# Patient Record
Sex: Male | Born: 1940 | Race: White | Hispanic: No | Marital: Married | Smoking: Never smoker
Health system: Southern US, Community
[De-identification: ages and names within clinical notes are randomized; demographics above are authoritative.]

## PROBLEM LIST (undated history)

## (undated) DIAGNOSIS — M778 Other enthesopathies, not elsewhere classified: Secondary | ICD-10-CM

## (undated) DIAGNOSIS — M545 Low back pain, unspecified: Secondary | ICD-10-CM

## (undated) DIAGNOSIS — N400 Enlarged prostate without lower urinary tract symptoms: Secondary | ICD-10-CM

## (undated) DIAGNOSIS — H9190 Unspecified hearing loss, unspecified ear: Secondary | ICD-10-CM

## (undated) DIAGNOSIS — Z950 Presence of cardiac pacemaker: Secondary | ICD-10-CM

## (undated) DIAGNOSIS — R351 Nocturia: Secondary | ICD-10-CM

## (undated) DIAGNOSIS — H9319 Tinnitus, unspecified ear: Secondary | ICD-10-CM

## (undated) DIAGNOSIS — N433 Hydrocele, unspecified: Secondary | ICD-10-CM

## (undated) DIAGNOSIS — R011 Cardiac murmur, unspecified: Secondary | ICD-10-CM

## (undated) DIAGNOSIS — I10 Essential (primary) hypertension: Secondary | ICD-10-CM

## (undated) DIAGNOSIS — E7800 Pure hypercholesterolemia, unspecified: Secondary | ICD-10-CM

## (undated) DIAGNOSIS — G4700 Insomnia, unspecified: Secondary | ICD-10-CM

## (undated) DIAGNOSIS — Q231 Congenital insufficiency of aortic valve: Secondary | ICD-10-CM

## (undated) DIAGNOSIS — Q2381 Bicuspid aortic valve: Secondary | ICD-10-CM

## (undated) DIAGNOSIS — E669 Obesity, unspecified: Secondary | ICD-10-CM

## (undated) HISTORY — DX: Insomnia, unspecified: G47.00

## (undated) HISTORY — DX: Benign prostatic hyperplasia without lower urinary tract symptoms: N40.0

## (undated) HISTORY — DX: Bicuspid aortic valve: Q23.81

## (undated) HISTORY — PX: OTHER SURGICAL HISTORY: SHX169

## (undated) HISTORY — PX: VASECTOMY: SHX75

## (undated) HISTORY — PX: JOINT REPLACEMENT: SHX530

## (undated) HISTORY — DX: Low back pain: M54.5

## (undated) HISTORY — DX: Low back pain, unspecified: M54.50

## (undated) HISTORY — DX: Congenital insufficiency of aortic valve: Q23.1

## (undated) HISTORY — DX: Obesity, unspecified: E66.9

## (undated) HISTORY — PX: TONSILLECTOMY: SUR1361

---

## 2010-04-21 ENCOUNTER — Inpatient Hospital Stay (HOSPITAL_COMMUNITY): Admission: RE | Admit: 2010-04-21 | Discharge: 2010-04-24 | Payer: Self-pay | Admitting: Orthopaedic Surgery

## 2010-08-29 LAB — CBC
HCT: 26.1 % — ABNORMAL LOW (ref 39.0–52.0)
HCT: 27.5 % — ABNORMAL LOW (ref 39.0–52.0)
HCT: 28.5 % — ABNORMAL LOW (ref 39.0–52.0)
Hemoglobin: 9.1 g/dL — ABNORMAL LOW (ref 13.0–17.0)
Hemoglobin: 9.4 g/dL — ABNORMAL LOW (ref 13.0–17.0)
Hemoglobin: 9.8 g/dL — ABNORMAL LOW (ref 13.0–17.0)
MCH: 32.2 pg (ref 26.0–34.0)
MCH: 32.5 pg (ref 26.0–34.0)
MCH: 32.6 pg (ref 26.0–34.0)
MCHC: 34.3 g/dL (ref 30.0–36.0)
MCHC: 34.3 g/dL (ref 30.0–36.0)
MCHC: 34.8 g/dL (ref 30.0–36.0)
MCV: 93.8 fL (ref 78.0–100.0)
MCV: 93.9 fL (ref 78.0–100.0)
MCV: 94.7 fL (ref 78.0–100.0)
Platelets: 148 10*3/uL — ABNORMAL LOW (ref 150–400)
Platelets: 154 10*3/uL (ref 150–400)
Platelets: 168 10*3/uL (ref 150–400)
RBC: 2.78 MIL/uL — ABNORMAL LOW (ref 4.22–5.81)
RBC: 2.93 MIL/uL — ABNORMAL LOW (ref 4.22–5.81)
RBC: 3.02 MIL/uL — ABNORMAL LOW (ref 4.22–5.81)
RDW: 13.5 % (ref 11.5–15.5)
RDW: 13.6 % (ref 11.5–15.5)
RDW: 13.8 % (ref 11.5–15.5)
WBC: 10.6 10*3/uL — ABNORMAL HIGH (ref 4.0–10.5)
WBC: 7.8 10*3/uL (ref 4.0–10.5)
WBC: 8.1 10*3/uL (ref 4.0–10.5)

## 2010-08-29 LAB — BASIC METABOLIC PANEL
BUN: 12 mg/dL (ref 6–23)
BUN: 12 mg/dL (ref 6–23)
BUN: 15 mg/dL (ref 6–23)
CO2: 28 mEq/L (ref 19–32)
CO2: 29 mEq/L (ref 19–32)
CO2: 29 mEq/L (ref 19–32)
Calcium: 8.4 mg/dL (ref 8.4–10.5)
Calcium: 8.5 mg/dL (ref 8.4–10.5)
Calcium: 8.6 mg/dL (ref 8.4–10.5)
Chloride: 106 mEq/L (ref 96–112)
Chloride: 106 mEq/L (ref 96–112)
Chloride: 107 mEq/L (ref 96–112)
Creatinine, Ser: 0.94 mg/dL (ref 0.4–1.5)
Creatinine, Ser: 1.01 mg/dL (ref 0.4–1.5)
Creatinine, Ser: 1.04 mg/dL (ref 0.4–1.5)
GFR calc Af Amer: >60 mL/min (ref >60)
GFR calc Af Amer: >60 mL/min (ref >60)
GFR calc Af Amer: >60 mL/min (ref >60)
GFR calc non Af Amer: >60 mL/min (ref >60)
GFR calc non Af Amer: >60 mL/min (ref >60)
GFR calc non Af Amer: >60 mL/min (ref >60)
Glucose, Bld: 108 mg/dL — ABNORMAL HIGH (ref 70–99)
Glucose, Bld: 112 mg/dL — ABNORMAL HIGH (ref 70–99)
Glucose, Bld: 159 mg/dL — ABNORMAL HIGH (ref 70–99)
Potassium: 3.9 mEq/L (ref 3.5–5.1)
Potassium: 4.2 mEq/L (ref 3.5–5.1)
Potassium: 4.3 mEq/L (ref 3.5–5.1)
Sodium: 140 mEq/L (ref 135–145)
Sodium: 140 mEq/L (ref 135–145)
Sodium: 143 mEq/L (ref 135–145)

## 2010-08-30 LAB — BASIC METABOLIC PANEL
BUN: 23 mg/dL (ref 6–23)
CO2: 29 mEq/L (ref 19–32)
Calcium: 10 mg/dL (ref 8.4–10.5)
Chloride: 106 mEq/L (ref 96–112)
Creatinine, Ser: 0.97 mg/dL (ref 0.4–1.5)
GFR calc Af Amer: >60 mL/min (ref >60)
GFR calc non Af Amer: >60 mL/min (ref >60)
Glucose, Bld: 89 mg/dL (ref 70–99)
Potassium: 4.9 mEq/L (ref 3.5–5.1)
Sodium: 142 mEq/L (ref 135–145)

## 2010-08-30 LAB — PROTIME-INR
INR: 1.02 (ref 0.00–1.49)
Prothrombin Time: 13.6 seconds (ref 11.6–15.2)

## 2010-08-30 LAB — URINALYSIS, ROUTINE W REFLEX MICROSCOPIC
Bilirubin Urine: NEGATIVE
Glucose, UA: NEGATIVE mg/dL
Hgb urine dipstick: NEGATIVE
Ketones, ur: NEGATIVE mg/dL
Nitrite: NEGATIVE
Protein, ur: NEGATIVE mg/dL
Specific Gravity, Urine: 1.02 (ref 1.005–1.030)
Urobilinogen, UA: 0.2 mg/dL (ref 0.0–1.0)
pH: 6.5 (ref 5.0–8.0)

## 2010-08-30 LAB — CBC
HCT: 40.1 % (ref 39.0–52.0)
Hemoglobin: 13.7 g/dL (ref 13.0–17.0)
MCH: 32.2 pg (ref 26.0–34.0)
MCHC: 34.2 g/dL (ref 30.0–36.0)
MCV: 94.2 fL (ref 78.0–100.0)
Platelets: 218 10*3/uL (ref 150–400)
RBC: 4.25 MIL/uL (ref 4.22–5.81)
RDW: 13.4 % (ref 11.5–15.5)
WBC: 5.6 10*3/uL (ref 4.0–10.5)

## 2010-08-30 LAB — SURGICAL PCR SCREEN
MRSA, PCR: NEGATIVE
Staphylococcus aureus: POSITIVE — AB

## 2011-03-09 DIAGNOSIS — R0989 Other specified symptoms and signs involving the circulatory and respiratory systems: Secondary | ICD-10-CM | POA: Insufficient documentation

## 2011-03-09 DIAGNOSIS — I452 Bifascicular block: Secondary | ICD-10-CM | POA: Insufficient documentation

## 2011-03-15 ENCOUNTER — Ambulatory Visit (HOSPITAL_COMMUNITY)
Admission: RE | Admit: 2011-03-15 | Discharge: 2011-03-15 | Disposition: A | Discharge status: Home / Self Care | Payer: BC Managed Care – PPO | Source: Ambulatory Visit | Attending: Urology | Admitting: Urology

## 2011-03-15 DIAGNOSIS — Z01812 Encounter for preprocedural laboratory examination: Secondary | ICD-10-CM | POA: Insufficient documentation

## 2011-03-15 DIAGNOSIS — I10 Essential (primary) hypertension: Secondary | ICD-10-CM | POA: Insufficient documentation

## 2011-03-15 DIAGNOSIS — Z79899 Other long term (current) drug therapy: Secondary | ICD-10-CM | POA: Insufficient documentation

## 2011-03-15 DIAGNOSIS — E7800 Pure hypercholesterolemia, unspecified: Secondary | ICD-10-CM | POA: Insufficient documentation

## 2011-03-15 DIAGNOSIS — Z01818 Encounter for other preprocedural examination: Secondary | ICD-10-CM | POA: Insufficient documentation

## 2011-03-15 DIAGNOSIS — N201 Calculus of ureter: Secondary | ICD-10-CM | POA: Insufficient documentation

## 2011-03-15 DIAGNOSIS — Z7982 Long term (current) use of aspirin: Secondary | ICD-10-CM | POA: Insufficient documentation

## 2011-03-15 DIAGNOSIS — R1084 Generalized abdominal pain: Secondary | ICD-10-CM | POA: Insufficient documentation

## 2011-03-15 DIAGNOSIS — Z96659 Presence of unspecified artificial knee joint: Secondary | ICD-10-CM | POA: Insufficient documentation

## 2011-03-15 LAB — BASIC METABOLIC PANEL
BUN: 19 mg/dL (ref 6–23)
CO2: 32 mEq/L (ref 19–32)
Calcium: 10.1 mg/dL (ref 8.4–10.5)
Chloride: 99 mEq/L (ref 96–112)
Creatinine, Ser: 0.96 mg/dL (ref 0.50–1.35)
GFR calc Af Amer: >60 mL/min (ref >60)
GFR calc non Af Amer: >60 mL/min (ref >60)
Glucose, Bld: 103 mg/dL — ABNORMAL HIGH (ref 70–99)
Potassium: 3.5 mEq/L (ref 3.5–5.1)
Sodium: 139 mEq/L (ref 135–145)

## 2011-03-15 LAB — CBC
HCT: 36.5 % — ABNORMAL LOW (ref 39.0–52.0)
Hemoglobin: 12.5 g/dL — ABNORMAL LOW (ref 13.0–17.0)
MCH: 30.9 pg (ref 26.0–34.0)
MCHC: 34.2 g/dL (ref 30.0–36.0)
MCV: 90.1 fL (ref 78.0–100.0)
Platelets: 209 10*3/uL (ref 150–400)
RBC: 4.05 MIL/uL — ABNORMAL LOW (ref 4.22–5.81)
RDW: 13.5 % (ref 11.5–15.5)
WBC: 6.1 10*3/uL (ref 4.0–10.5)

## 2011-03-15 LAB — SURGICAL PCR SCREEN
MRSA, PCR: NEGATIVE
Staphylococcus aureus: POSITIVE — AB

## 2011-03-29 NOTE — Op Note (Signed)
NAMERONDEL, EPISCOPO NO.:  [id number]  MEDICAL RECORD NO.:  [id number]  LOCATION:                               FACILITY:  [facility]  PHYSICIAN:  [name], MD      DATE OF BIRTH:  Sep 29, 1940  DATE OF PROCEDURE:  03/15/2011 DATE OF DISCHARGE:                              OPERATIVE REPORT   PREOPERATIVE DIAGNOSIS:  Right ureteral calculus.  POSTOPERATIVE DIAGNOSIS:  Right ureteral calculus.  PROCEDURE: 1. Cystoscopy. 2. Right retrograde pyelography with interpretation. 3. Right ureteroscopic laser lithotripsy and stone removal. 4. Right ureteral stent placement (6 x 24).  SURGEON:  [name], MD  ANESTHESIA:  General.  COMPLICATIONS:  None.  INDICATION:  Derek Washington is a 70 year old gentleman who recently developed right-sided flank pain and underwent a CT scan, which demonstrated a large 1 cm distal right ureteral calculus.  After discussing options for treatment, he elected to proceed with ureteroscopic laser lithotripsy as indicated above.  The potential risks, complications, and alternative treatment options were discussed in detail and informed consent obtained.  DESCRIPTION OF PROCEDURE:  The patient was taken to the operating room and a general anesthetic was administered.  He was given preoperative antibiotics, placed in the dorsal lithotomy position, and prepped and draped in the usual sterile fashion.  Next, preoperative time-out was performed.  Cystourethroscopy was then performed, which revealed a normal anterior urethra.  The prostatic urethra was significant for a high bladder neck and bilobar hypertrophy as well.  Inspection of the bladder revealed no evidence of bladder tumors, stones, or other mucosal pathology.  Attention then turned to the right ureteral orifice, which was cannulated with a 6-French ureteral catheter.  Omnipaque contrast was injected and demonstrated a large filling defect in the distal right ureter consistent  with the patient's calculus.  A 0.038 sensor guidewire was then manipulated into the ureter and passed the stone into the right renal pelvis under fluoroscopic guidance.  Of note, the patient's ureteral orifice was noted to be quite stenotic.  A semi-rigid 6-French ureteroscope was then advanced into the bladder and a second 0.038 Glidewire was used to help open the ureteral orifice to allow the ureteroscope passed into the distal ureter, which he did without significant difficulty.  The stone was immediately identified.  The catheter was then advanced, passed the stone into the ureter just proximal to the stone and it was used to inject BackStop Gel.  This prevented stone migration proximally.  A 365 micron fiber was then used to perform holmium laser lithotripsy of the stone in the setting of 0.6 joules and 6 Hz.  Once the stone was adequately fragmented, the fragments were all removed with a Zero Tip nitinol basket and brought into the bladder.  The stones were eventually removed via the cystoscope and drained out of the bladder and removed for specimen.  Due to fact that there were multiple fragments required, the ureteroscope to be passed in and out of the ureter multiple times, it was felt that there would be significant ureteral edema.  Therefore, it was decided to place a ureteral stent.  The wire was back loaded on the cystoscope and a 6 x  24 double-J ureteral stent was advanced over the wire using Seldinger technique and positioned appropriately under fluoroscopic and cystoscopic guidance.  The wire was then removed with a good curl noted in the renal pelvis as well as in the bladder.  The patient appeared to tolerate the procedure well and without complications.  He was able to be awakened and transferred to recovery unit in satisfactory condition.  He will follow up in approximately 2 weeks for cystoscopy and stent removal in the office.     [name],  MD     LB/MEDQ  D:  03/15/2011  T:  03/16/2011  Job:  [id number]  Electronically Signed by [name] MD on 03/29/2011 07:08:14 PM

## 2011-11-09 DIAGNOSIS — R7301 Impaired fasting glucose: Secondary | ICD-10-CM | POA: Insufficient documentation

## 2013-03-02 DIAGNOSIS — R55 Syncope and collapse: Secondary | ICD-10-CM | POA: Insufficient documentation

## 2013-05-22 ENCOUNTER — Other Ambulatory Visit: Payer: Self-pay | Admitting: Orthopaedic Surgery

## 2013-05-22 DIAGNOSIS — M25512 Pain in left shoulder: Secondary | ICD-10-CM

## 2013-05-26 ENCOUNTER — Ambulatory Visit
Admission: RE | Admit: 2013-05-26 | Discharge: 2013-05-26 | Disposition: A | Discharge status: Home / Self Care | Payer: Medicare Other | Source: Ambulatory Visit | Attending: Orthopaedic Surgery | Admitting: Orthopaedic Surgery

## 2013-05-26 DIAGNOSIS — M25512 Pain in left shoulder: Secondary | ICD-10-CM

## 2013-09-24 DIAGNOSIS — B009 Herpesviral infection, unspecified: Secondary | ICD-10-CM | POA: Insufficient documentation

## 2015-06-19 HISTORY — PX: HYDROCELE EXCISION: SHX482

## 2015-10-21 ENCOUNTER — Other Ambulatory Visit: Payer: Self-pay | Admitting: Urology

## 2015-12-22 ENCOUNTER — Encounter (HOSPITAL_COMMUNITY)
Admission: RE | Admit: 2015-12-22 | Discharge: 2015-12-22 | Disposition: A | Discharge status: Home / Self Care | Payer: Medicare Other | Source: Ambulatory Visit | Attending: Urology | Admitting: Urology

## 2015-12-22 ENCOUNTER — Encounter (HOSPITAL_COMMUNITY): Payer: Self-pay

## 2015-12-22 ENCOUNTER — Ambulatory Visit (HOSPITAL_COMMUNITY)
Admission: RE | Admit: 2015-12-22 | Discharge: 2015-12-22 | Disposition: A | Discharge status: Home / Self Care | Payer: Medicare Other | Source: Ambulatory Visit | Attending: Anesthesiology | Admitting: Anesthesiology

## 2015-12-22 DIAGNOSIS — I700 Atherosclerosis of aorta: Secondary | ICD-10-CM | POA: Insufficient documentation

## 2015-12-22 DIAGNOSIS — Z01810 Encounter for preprocedural cardiovascular examination: Secondary | ICD-10-CM

## 2015-12-22 DIAGNOSIS — Z01818 Encounter for other preprocedural examination: Secondary | ICD-10-CM | POA: Diagnosis not present

## 2015-12-22 HISTORY — DX: Pure hypercholesterolemia, unspecified: E78.00

## 2015-12-22 HISTORY — DX: Other enthesopathies, not elsewhere classified: M77.8

## 2015-12-22 HISTORY — DX: Nocturia: R35.1

## 2015-12-22 HISTORY — DX: Cardiac murmur, unspecified: R01.1

## 2015-12-22 HISTORY — DX: Hydrocele, unspecified: N43.3

## 2015-12-22 HISTORY — DX: Essential (primary) hypertension: I10

## 2015-12-22 HISTORY — DX: Unspecified hearing loss, unspecified ear: H91.90

## 2015-12-22 HISTORY — DX: Tinnitus, unspecified ear: H93.19

## 2015-12-22 LAB — CBC
HCT: 39.8 % (ref 39.0–52.0)
Hemoglobin: 13.8 g/dL (ref 13.0–17.0)
MCH: 31.2 pg (ref 26.0–34.0)
MCHC: 34.7 g/dL (ref 30.0–36.0)
MCV: 90 fL (ref 78.0–100.0)
Platelets: 214 10*3/uL (ref 150–400)
RBC: 4.42 MIL/uL (ref 4.22–5.81)
RDW: 13.5 % (ref 11.5–15.5)
WBC: 6.9 10*3/uL (ref 4.0–10.5)

## 2015-12-22 LAB — BASIC METABOLIC PANEL
Anion gap: 8 (ref 5–15)
BUN: 23 mg/dL — ABNORMAL HIGH (ref 6–20)
CO2: 26 mmol/L (ref 22–32)
Calcium: 9.9 mg/dL (ref 8.9–10.3)
Chloride: 106 mmol/L (ref 101–111)
Creatinine, Ser: 0.95 mg/dL (ref 0.61–1.24)
GFR calc Af Amer: >60 mL/min (ref >60)
GFR calc non Af Amer: >60 mL/min (ref >60)
Glucose, Bld: 101 mg/dL — ABNORMAL HIGH (ref 65–99)
Potassium: 4.5 mmol/L (ref 3.5–5.1)
Sodium: 140 mmol/L (ref 135–145)

## 2015-12-22 NOTE — Progress Notes (Addendum)
BMP and CXR results per epic per PAT visit 12/22/2015 sent to Dr Laverle PatterBorden

## 2015-12-22 NOTE — Patient Instructions (Signed)
Derek OfficerMerllyn Washington  12/22/2015   Your procedure is scheduled on: Monday December 26, 2015  [facility]  [place] to 3rd floor to  Short Stay Center at 1:45 PM.  Call this number if you have problems the morning of surgery [phone]   Remember: ONLY 1 PERSON MAY GO WITH YOU TO SHORT STAY TO GET  READY MORNING OF YOUR SURGERY.  Do not eat food After Midnight butr may take clear liquids till 9:45 am day of surgery then nothing by mouth.      Take these medicines the morning of surgery with A SIP OF WATER: Finesteride                                You may not have any metal on your body including hair pins and              piercings  Do not wear jewelry,  lotions, powders or colognes, deodorant                           Men may shave face and neck.   Do not bring valuables to the hospital. [facility] IS NOT             RESPONSIBLE   FOR VALUABLES.  Contacts, dentures or bridgework may not be worn into surgery.      Patients discharged the day of surgery will not be allowed to drive home.  Name and phone number of your driver:Derek Washington (wife)  _____________________________________________________________________             [facility] - Preparing for Surgery Before surgery, you can play an important role.  Because skin is not sterile, your skin needs to be as free of germs as possible.  You can reduce the number of germs on your skin by washing with CHG (chlorahexidine gluconate) soap before surgery.  CHG is an antiseptic cleaner which kills germs and bonds with the skin to continue killing germs even after washing. Please DO NOT use if you have an allergy to CHG or antibacterial soaps.  If your skin becomes reddened/irritated stop using the CHG and inform your nurse when you arrive at Short Stay. Do not shave (including legs and underarms) for at least 48 hours prior to the first CHG shower.  You may shave your  face/neck. Please follow these instructions carefully:  1.  Shower with CHG Soap the night before surgery and the  morning of Surgery.  2.  If you choose to wash your hair, wash your hair first as usual with your  normal  shampoo.  3.  After you shampoo, rinse your hair and body thoroughly to remove the  shampoo.                           4.  Use CHG as you would any other liquid soap.  You can apply chg directly  to the skin and wash                       Gently with a scrungie or clean washcloth.  5.  Apply the CHG Soap to your body ONLY FROM THE NECK DOWN.   Do not  use on face/ open                           Wound or open sores. Avoid contact with eyes, ears mouth and genitals (private parts).                       Wash face,  Genitals (private parts) with your normal soap.             6.  Wash thoroughly, paying special attention to the area where your surgery  will be performed.  7.  Thoroughly rinse your body with warm water from the neck down.  8.  DO NOT shower/wash with your normal soap after using and rinsing off  the CHG Soap.                9.  Pat yourself dry with a clean towel.            10.  Wear clean pajamas.            11.  Place clean sheets on your bed the night of your first shower and do not  sleep with pets. Day of Surgery : Do not apply any lotions/deodorants the morning of surgery.  Please wear clean clothes to the hospital/surgery center.  FAILURE TO FOLLOW THESE INSTRUCTIONS MAY RESULT IN THE CANCELLATION OF YOUR SURGERY PATIENT SIGNATURE_________________________________  NURSE SIGNATURE__________________________________  ________________________________________________________________________    CLEAR LIQUID DIET   Foods Allowed                                                                     Foods Excluded  Coffee and tea, regular and decaf                             liquids that you cannot  Plain Jell-O in any flavor                                              see through such as: Fruit ices (not with fruit pulp)                                     milk, soups, orange juice  Iced Popsicles                                    All solid food Carbonated beverages, regular and diet                                    Cranberry, grape and apple juices Sports drinks like Gatorade Lightly seasoned clear broth or consume(fat free) Sugar, honey syrup  Sample Menu Breakfast  Lunch                                     Supper Cranberry juice                    Beef broth                            Chicken broth Jell-O                                     Grape juice                           Apple juice Coffee or tea                        Jell-O                                      Popsicle                                                Coffee or tea                        Coffee or tea  _____________________________________________________________________

## 2015-12-22 NOTE — Progress Notes (Signed)
OV note per cardiology / Dr Gwendolyn LimaBodek 07/12/2015 on chart ECHO report per chart 06/27/2015  EKG per chart 07/12/2015

## 2015-12-22 NOTE — Progress Notes (Signed)
Requested cardiology / Dr Gwendolyn LimaBodek clearance. Spoke with [employer].

## 2015-12-23 NOTE — H&P (Signed)
History of Present Illness Mr. 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 His PSA increased to 5.34 in December 2009 but a PCA-3 was negative. He is treated with finasteride (began January 2010). After his PSA reached a new baseline of 1.59 on 5 ARI therapy, his PSA again increased to 3.61 in April 2013 prompting another prostate biopsy which was benign. 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 He has BPH based on his prostate ultrasound in June 2009. Current treatment: Finasteride 5 mg (began in January 2010) 3) Urolithiasis: He has a history of calcium oxalate urolithiasis. 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 This has been asymptomatic until 2017. 5) S/P left hydrocele repair in 1988 6) S/P vasectomy in 1968  Past Medical History Problems  1. History of hypercholesterolemia (Z86.39) 2. History of hypertension (Z86.79) Surgical History Problems  1. History of Cystoscopy With Insertion Of Ureteral Stent Right 2. History of Cystoscopy With Ureteroscopy With Lithotripsy 3. History of Elbow Surgery Repair 4. History of Knee Replacement 5. History of Knee Surgery 6. History of Surgery Tunica Vaginalis Excision Of Hydrocele Left 7. History of Surgery Vas Deferens Vasectomy Current Meds 1. AmLODIPine Besylate 5 MG Oral Tablet; Therapy: 30Jul2012 to Recorded 2. Aspirin 325 MG Oral Tablet; Therapy: (Recorded:11Jun2009) to Recorded 3. Atorvastatin Calcium 20 MG  Oral Tablet; Therapy: 31Aug2012 to Recorded 4. Finasteride 5 MG Oral Tablet; Take 1 tablet every day; Therapy: 13Jan2010 to (Evaluate:11Apr2016) Requested for: 17Apr2015; Last Rx:17Apr2015 Ordered 5. Fluocinonide 0.05 % External Cream; Therapy: 23Aug2012 to Recorded 6. Lisinopril-Hydrochlorothiazide 20-12.5 MG Oral Tablet; Therapy: 27Apr2012 to Recorded 7. Multivitamins TABS; Therapy: (Recorded:11Jun2009) to Recorded 8. Niaspan 500 MG Oral Tablet Extended Release; Therapy: (Recorded:11Jun2009) to Recorded 9. Prinzide 20-25 MG TABS; Therapy: (Recorded:11Jun2009) to Recorded 10. Triamcinolone Acetonide 0.1 % External Cream; Therapy: 24Mar2014 to Recorded 11. Zolpidem Tartrate 10 MG Oral Tablet; Therapy: 13Sep2013 to Recorded Allergies Medication  1. No Known Drug Allergies Family History Problems  1. No pertinent family history : Mother 2. Denied: Family history of Prostate Cancer Social History Problems  1. Alcohol Use (History)  maybe 2 a month 2. Marital History - Currently Married 3. Never A Smoker 4. Denied: History of Tobacco Use Review of Systems Genitourinary: no hematuria.  Hematologic/Lymphatic: no swollen glands.  Cardiovascular: no leg swelling.  Neurological: no headache.  Vitals  Height: 6 ft Weight: 235 lb BMI Calculated: 31.87 BSA Calculated: 2.28  Physical Exam Constitutional: Well nourished and well developed . No acute distress.  ENT:. The ears and nose are normal in appearance.  Neck: The appearance of the neck is normal and no neck mass is present.  Pulmonary: No respiratory distress, normal respiratory rhythm and effort and clear bilateral breath sounds.  Cardiovascular: Heart rate and rhythm are normal . No peripheral edema.  Rectal: Rectal exam demonstrates normal sphincter tone, no tenderness and no masses. Prostate size is estimated to be 50 g. The prostate has no nodularity and is not tender. The left seminal vesicle  is nonpalpable. The  right seminal vesicle is nonpalpable. The perineum is normal on inspection.  Genitourinary: Examination of the penis demonstrates no discharge, no masses, no lesions and a normal meatus. The scrotum is without lesions. The right epididymis is palpably normal and non-tender. The right testis is non-tender and without masses. He does have an enlarged left hemiscrotum with a large cystic masses previously noted. Left testis is able be palpated inferiorly indicating that this is likely consistent with a spermatocele as previously noted on ultrasound. This remains relatively large measuring 7 x 6 cm. It is soft on palpation and nontender.  Neuro/Psych:. Mood and affect are appropriate.   Assessment Assessed  1 Spermatocele (N43.40)  Discussion/Summary . Left spermatocele: He does wish to proceed with surgical removal as this has become more bothersome.  We reviewed potential risks including but not limited to bleeding, infection, hematoma formation, epididymitis, risk of injury to the testicle or blood supply to the testicle, infertility, hypogonadism, etc. We also discussed the potential risk of recurrence. I discussed the potential benefits and risks of the procedure, side effects of the proposed treatment, the likelihood of the patient achieving the goals of the procedure, and any potential problems that might occur during the procedure or recuperation.

## 2015-12-23 NOTE — Progress Notes (Signed)
Spoke with Dr B Judd/anesthesia in regards to pts H&P, ECHO report (per chart) and EKG (per chart). No orders given. Dr Gentry RochJudd did not feel pt was required to have cardiac clearance prior to scheduled surgery date. Anesthesia to see pt day of surgery.

## 2015-12-26 ENCOUNTER — Encounter (HOSPITAL_COMMUNITY)
Admission: RE | Disposition: A | Discharge status: Home / Self Care | Payer: Self-pay | Source: Ambulatory Visit | Attending: Urology

## 2015-12-26 ENCOUNTER — Encounter (HOSPITAL_COMMUNITY): Payer: Self-pay | Admitting: *Deleted

## 2015-12-26 ENCOUNTER — Ambulatory Visit (HOSPITAL_COMMUNITY)
Admission: RE | Admit: 2015-12-26 | Discharge: 2015-12-26 | Disposition: A | Discharge status: Home / Self Care | Payer: Medicare Other | Source: Ambulatory Visit | Attending: Urology | Admitting: Urology

## 2015-12-26 ENCOUNTER — Ambulatory Visit (HOSPITAL_COMMUNITY): Payer: Medicare Other | Admitting: Anesthesiology

## 2015-12-26 DIAGNOSIS — I10 Essential (primary) hypertension: Secondary | ICD-10-CM | POA: Insufficient documentation

## 2015-12-26 DIAGNOSIS — Z7982 Long term (current) use of aspirin: Secondary | ICD-10-CM | POA: Insufficient documentation

## 2015-12-26 DIAGNOSIS — N4340 Spermatocele of epididymis, unspecified: Secondary | ICD-10-CM | POA: Diagnosis present

## 2015-12-26 DIAGNOSIS — R972 Elevated prostate specific antigen [PSA]: Secondary | ICD-10-CM | POA: Insufficient documentation

## 2015-12-26 DIAGNOSIS — E7800 Pure hypercholesterolemia, unspecified: Secondary | ICD-10-CM | POA: Insufficient documentation

## 2015-12-26 DIAGNOSIS — Z79899 Other long term (current) drug therapy: Secondary | ICD-10-CM | POA: Insufficient documentation

## 2015-12-26 DIAGNOSIS — N4341 Spermatocele of epididymis, single: Secondary | ICD-10-CM | POA: Diagnosis not present

## 2015-12-26 DIAGNOSIS — R351 Nocturia: Secondary | ICD-10-CM | POA: Insufficient documentation

## 2015-12-26 DIAGNOSIS — R3912 Poor urinary stream: Secondary | ICD-10-CM | POA: Diagnosis not present

## 2015-12-26 DIAGNOSIS — N401 Enlarged prostate with lower urinary tract symptoms: Secondary | ICD-10-CM | POA: Diagnosis not present

## 2015-12-26 DIAGNOSIS — I4510 Unspecified right bundle-branch block: Secondary | ICD-10-CM | POA: Diagnosis not present

## 2015-12-26 DIAGNOSIS — Z87442 Personal history of urinary calculi: Secondary | ICD-10-CM | POA: Diagnosis not present

## 2015-12-26 DIAGNOSIS — I352 Nonrheumatic aortic (valve) stenosis with insufficiency: Secondary | ICD-10-CM | POA: Diagnosis not present

## 2015-12-26 HISTORY — PX: SCROTAL EXPLORATION: SHX2386

## 2015-12-26 SURGERY — EXPLORATION, SCROTUM
Anesthesia: General

## 2015-12-26 MED ORDER — ONDANSETRON HCL 4 MG/2ML IJ SOLN: 4.0000 mg | Freq: Once | INTRAMUSCULAR | Status: DC | PRN

## 2015-12-26 MED ORDER — BUPIVACAINE HCL (PF) 0.25 % IJ SOLN
INTRAMUSCULAR | Status: AC
  Filled 2015-12-26: qty 30

## 2015-12-26 MED ORDER — CEFAZOLIN SODIUM-DEXTROSE 2-4 GM/100ML-% IV SOLN
2.0000 g | INTRAVENOUS | Status: AC
  Administered 2015-12-26: 2 g via INTRAVENOUS
  Filled 2015-12-26: qty 100

## 2015-12-26 MED ORDER — HYDROCODONE-ACETAMINOPHEN 5-325 MG PO TABS: 1.0000 | ORAL_TABLET | ORAL | Status: DC | PRN

## 2015-12-26 MED ORDER — CEFAZOLIN SODIUM-DEXTROSE 2-4 GM/100ML-% IV SOLN
INTRAVENOUS | Status: AC
Start: 2015-12-26 — End: 2015-12-26
  Filled 2015-12-26: qty 100

## 2015-12-26 MED ORDER — PROPOFOL 10 MG/ML IV BOLUS
INTRAVENOUS | Status: AC
  Filled 2015-12-26: qty 20

## 2015-12-26 MED ORDER — FENTANYL CITRATE (PF) 100 MCG/2ML IJ SOLN: 25.0000 ug | INTRAMUSCULAR | Status: DC | PRN

## 2015-12-26 MED ORDER — KETOROLAC TROMETHAMINE 30 MG/ML IJ SOLN
INTRAMUSCULAR | Status: DC | PRN
  Administered 2015-12-26: 30 mg via INTRAVENOUS

## 2015-12-26 MED ORDER — PROPOFOL 10 MG/ML IV BOLUS
INTRAVENOUS | Status: DC | PRN
  Administered 2015-12-26: 200 mg via INTRAVENOUS

## 2015-12-26 MED ORDER — LIDOCAINE 2% (20 MG/ML) 5 ML SYRINGE
INTRAMUSCULAR | Status: DC | PRN
  Administered 2015-12-26: 100 mg via INTRAVENOUS

## 2015-12-26 MED ORDER — BUPIVACAINE HCL (PF) 0.25 % IJ SOLN
INTRAMUSCULAR | Status: DC | PRN
  Administered 2015-12-26: 10 mL

## 2015-12-26 MED ORDER — EPHEDRINE SULFATE 50 MG/ML IJ SOLN
INTRAMUSCULAR | Status: AC
  Filled 2015-12-26: qty 1

## 2015-12-26 MED ORDER — SODIUM CHLORIDE 0.9 % IJ SOLN
INTRAMUSCULAR | Status: AC
  Filled 2015-12-26: qty 10

## 2015-12-26 MED ORDER — FENTANYL CITRATE (PF) 100 MCG/2ML IJ SOLN
INTRAMUSCULAR | Status: AC
  Filled 2015-12-26: qty 2

## 2015-12-26 MED ORDER — LACTATED RINGERS IV SOLN
INTRAVENOUS | Status: DC
  Administered 2015-12-26: 1000 mL via INTRAVENOUS
  Administered 2015-12-26: 17:00:00 via INTRAVENOUS

## 2015-12-26 MED ORDER — EPHEDRINE SULFATE 50 MG/ML IJ SOLN
INTRAMUSCULAR | Status: DC | PRN
  Administered 2015-12-26 (×3): 10 mg via INTRAVENOUS

## 2015-12-26 MED ORDER — FENTANYL CITRATE (PF) 100 MCG/2ML IJ SOLN
INTRAMUSCULAR | Status: DC | PRN
  Administered 2015-12-26 (×2): 50 ug via INTRAVENOUS

## 2015-12-26 MED ORDER — PHENYLEPHRINE HCL 10 MG/ML IJ SOLN
INTRAMUSCULAR | Status: DC | PRN
  Administered 2015-12-26: 80 ug via INTRAVENOUS

## 2015-12-26 SURGICAL SUPPLY — 21 items
BLADE HEX COATED 2.75 (ELECTRODE) IMPLANT
BNDG GAUZE ELAST 4 BULKY (GAUZE/BANDAGES/DRESSINGS) ×4 IMPLANT
COVER SURGICAL LIGHT HANDLE (MISCELLANEOUS) IMPLANT
ELECT REM PT RETURN 9FT ADLT (ELECTROSURGICAL) ×2
ELECTRODE REM PT RTRN 9FT ADLT (ELECTROSURGICAL) ×1 IMPLANT
GLOVE BIOGEL M STRL SZ7.5 (GLOVE) ×4 IMPLANT
GOWN STRL REUS W/TWL LRG LVL3 (GOWN DISPOSABLE) ×4 IMPLANT
KIT BASIN OR (CUSTOM PROCEDURE TRAY) ×2 IMPLANT
LIQUID BAND (GAUZE/BANDAGES/DRESSINGS) ×2 IMPLANT
NEEDLE HYPO 22GX1.5 SAFETY (NEEDLE) ×2 IMPLANT
NS IRRIG 1000ML POUR BTL (IV SOLUTION) ×2 IMPLANT
PACK GENERAL/GYN (CUSTOM PROCEDURE TRAY) ×2 IMPLANT
SUPPORT SCROTAL LG STRP (MISCELLANEOUS) IMPLANT
SUT CHROMIC 3 0 SH 27 (SUTURE) ×8 IMPLANT
SUT CHROMIC 4 0 SH 27 (SUTURE) ×2 IMPLANT
SUT VIC AB 2-0 UR5 27 (SUTURE) IMPLANT
SUT VICRYL 0 TIES 12 18 (SUTURE) IMPLANT
SYR CONTROL 10ML LL (SYRINGE) ×2 IMPLANT
TOWEL OR 17X26 10 PK STRL BLUE (TOWEL DISPOSABLE) ×2 IMPLANT
TUBING INSUFFLATION 10FT LAP (TUBING) IMPLANT
WATER STERILE IRR 1500ML POUR (IV SOLUTION) IMPLANT

## 2015-12-26 NOTE — Anesthesia Procedure Notes (Signed)
Procedure Name: LMA Insertion Date/Time: 12/26/2015 4:23 PM Performed by: Leroy LibmanEARDON, Jazae Gandolfi L Patient Re-evaluated:Patient Re-evaluated prior to inductionOxygen Delivery Method: Circle system utilized Preoxygenation: Pre-oxygenation with 100% oxygen Intubation Type: IV induction Ventilation: Mask ventilation without difficulty LMA Size: 4.0 Number of attempts: 1 Placement Confirmation: positive ETCO2 and breath sounds checked- equal and bilateral Tube secured with: Tape Dental Injury: Teeth and Oropharynx as per pre-operative assessment

## 2015-12-26 NOTE — Op Note (Signed)
Operative Note:  Preoperative Diagnosis: Left spermatocele  Postoperative Diagnosis:  Same  Procedure(s) Performed:   1. Left spermatocelectomy  Teaching Surgeon:  Heloise PurpuraLester Rafay Dahan, MD  Resident Surgeon:  Lincoln Brighamroy Sukhu, MD  Assistant(s):  None  Anesthesia:  General via LMA  Fluids:  See anesthesia record  Estimated blood loss:  10 mL  Specimens: Left spermatocele sac sent for final pathology  Cultures:  None  Drains:  None  Complications:  None  Indications: 75 yo male with large left symptomatic spermatoceles requesting repair.  Findings:  Left loculated spermatocele sac dissected down to neck prior to excision and sent for final pathology. Normal left and spermatic cord with evidence of prior hydrocelectomy with no obvious recurrent hydrocele. Excellent hemostasis at end.  Description:  The patient was correctly identified in the preop holding area where written informed consent as well potential risk and complication reviewed. He agreed.  The patient was brought to the operating room and placed supine on the operating room table.  General anesthesia was administered. A time out was performed.  The patient was identified, the consent was reviewed and the side and site of surgery was verified.  The patient was examined and the diagnoses confirmed.   The lower abdomen and genitalia were prepped and draped in the usual sterile manner.    A 5 cm vertical incision was made along the midline raphe with a 15 blade scalpel.  We turned our attention to the left large multi-loculated spermatocele and testicle. Electrocautery was used to dissect through the subcutaneous tissues and dartos down to the left spermatocele sac. The left testicle and spermatocele was also delivered after excision of an inflammatory rind. The testicle was grossly normal in appearance and on palpation with no masses identified. A combination of blunt dissection and electrocautery were utilized to carefully dissect off  the surrounding layers of the spermatocele sac and completely mobilize the spermatocele at two possible thin necks.  We then placed a hemostat across both bases and both necks were tied off with 3-0 chromic suture prior to being sharply divided. We then send the spermatocele sac for final pathology. The prior hydrocele sac was opened in a small location with a small amount of fluid evacuated but no significant hydrocele noted. The prior sac appeared scarred down.  The left cord was examined to ensure that all cord structures were present. We then placed the left testicle back into the scrotum in normal anatomic position assuring no twisting of the spermatic cord. We used electrocautery to assure excellent hemostasis.  We then utilized 3-0 chromic in a running baseball fashion to close dartos and the subcutaneous tissue. Finally, we used 4-0 chromic to close the scrotal skin with a running horizontal mattress.   The surgical area was cleaned and dried.  Dermabond was applied to the scrotal incision and once dried we placed fluffs and mesh underwear. The patient tolerated the procedure well, was awaken and transferred to PACU in stable condition.  There were no complications.  Final counts were correct.    Post Op Plan:   1. Discharge patient home when meets PACU criteria. 2. Resume aspirin in one week.  Attestation:  Dr. Laverle PatterBorden was present and scrubbed for the entirety of the procedure.

## 2015-12-26 NOTE — Anesthesia Postprocedure Evaluation (Signed)
Anesthesia Post Note  Patient: Derek Washington  Procedure(s) Performed: Procedure(s) (LRB): SCROTUM EXPLORATION (N/A)  Patient location during evaluation: PACU Anesthesia Type: General Level of consciousness: awake and alert Pain management: pain level controlled Vital Signs Assessment: post-procedure vital signs reviewed and stable Respiratory status: spontaneous breathing, nonlabored ventilation, respiratory function stable and patient connected to nasal cannula oxygen Cardiovascular status: blood pressure returned to baseline and stable Postop Assessment: no signs of nausea or vomiting Anesthetic complications: no    Last Vitals:  Filed Vitals:   12/26/15 1737 12/26/15 1745  BP: 162/77 164/72  Pulse: 68 66  Temp: 36.7 C   Resp: 12 17    Last Pain: There were no vitals filed for this visit.               Rony Ratz S

## 2015-12-26 NOTE — Transfer of Care (Signed)
Immediate Anesthesia Transfer of Care Note  Patient: Derek OfficerMerllyn Caseres  Procedure(s) Performed: Procedure(s) with comments: SCROTUM EXPLORATION (N/A) - EXCISION OF LEFT EPIDIDYMAL CYST/SPERMATOCELE  Patient Location: PACU  Anesthesia Type:General  Level of Consciousness: awake, alert  and oriented  Airway & Oxygen Therapy: Patient Spontanous Breathing and Patient connected to face mask oxygen  Post-op Assessment: Report given to RN and Post -op Vital signs reviewed and stable  Post vital signs: Reviewed and stable  Last Vitals:  Filed Vitals:   12/26/15 1412  BP: 136/81  Pulse: 55  Temp: 36.9 C  Resp: 14    Last Pain: There were no vitals filed for this visit.    Patients Stated Pain Goal: 4 (12/26/15 1534)  Complications: No apparent anesthesia complications

## 2015-12-26 NOTE — Anesthesia Preprocedure Evaluation (Addendum)
Anesthesia Evaluation  Patient identified by MRN, date of birth, ID band Patient awake    Reviewed: Allergy & Precautions, NPO status , Patient's Chart, lab work & pertinent test results  Airway Mallampati: II  TM Distance: >3 FB Neck ROM: Full    Dental no notable dental hx. (+) Teeth Intact, Dental Advisory Given   Pulmonary neg pulmonary ROS,    Pulmonary exam normal breath sounds clear to auscultation       Cardiovascular hypertension, Pt. 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  Reproductive/Obstetrics negative OB ROS                          Anesthesia Physical Anesthesia Plan  ASA: II  Anesthesia Plan: General   Post-op Pain Management:    Induction: Intravenous  Airway Management Planned: LMA  Additional Equipment:   Intra-op Plan:   Post-operative Plan: Extubation in OR  Informed Consent: I have reviewed the patients History and Physical, chart, labs and discussed the procedure including the risks, benefits and alternatives for the proposed anesthesia with the patient or authorized representative who has indicated his/her understanding and acceptance.   Dental advisory given  Plan Discussed with: CRNA and Surgeon  Anesthesia Plan Comments:  (Risks/benefits of general anesthesia discussed with patient including risk of damage to teeth, lips, gum, and tongue, nausea/vomiting, allergic reactions to medications, and the possibility of heart attack, stroke and death.  All patient questions answered.  Patient wishes to proceed.)       Anesthesia Quick Evaluation

## 2015-12-27 ENCOUNTER — Encounter (HOSPITAL_COMMUNITY): Payer: Self-pay | Admitting: Urology

## 2018-06-06 ENCOUNTER — Telehealth: Payer: Self-pay

## 2018-06-06 NOTE — Telephone Encounter (Signed)
SENT REFERRAL TO SCHEDULING AND FILED NOTES 

## 2018-08-28 ENCOUNTER — Encounter: Payer: Self-pay | Admitting: Interventional Cardiology

## 2018-09-08 ENCOUNTER — Ambulatory Visit: Payer: Medicare Other | Admitting: Interventional Cardiology

## 2018-10-23 ENCOUNTER — Telehealth: Payer: Self-pay

## 2018-10-23 NOTE — Telephone Encounter (Signed)
Called pt to set up possible evisit, left message asking pt to call the office.  

## 2018-10-27 NOTE — Telephone Encounter (Signed)
Called pt to set up possible evisit, left message asking them to call the office.

## 2018-11-03 ENCOUNTER — Telehealth: Payer: Self-pay

## 2018-11-03 NOTE — Telephone Encounter (Signed)
Virtual Visit Pre-Appointment Phone Call  TELEPHONE CALL NOTE  Derek Washington has been deemed a candidate for a follow-up tele-health visit to limit community exposure during the Covid-19 pandemic. I spoke with the patient via phone to ensure availability of phone/video source, confirm preferred email & phone number, and discuss instructions and expectations.  I reminded Derek Washington to be prepared with any vital sign and/or heart rhythm information that could potentially be obtained via home monitoring, at the time of his visit. I reminded Derek Washington to expect a phone call prior to his visit.  Patient agrees to consent below.  Lattie Haw, RN 11/03/2018 1:51 PM    FULL LENGTH CONSENT FOR TELE-HEALTH VISIT   I hereby voluntarily request, consent and authorize [facility] and its employed or contracted physicians, physician assistants, nurse practitioners or other licensed health care professionals (the Practitioner), to provide me with telemedicine health care services (the "Services") as deemed necessary by the treating Practitioner. I acknowledge and consent to receive the Services by the Practitioner via telemedicine. I understand that the telemedicine visit will involve communicating with the Practitioner through live audiovisual communication technology and the disclosure of certain medical information by electronic transmission. I acknowledge that I have been given the opportunity to request an in-person assessment or other available alternative prior to the telemedicine visit and am voluntarily participating in the telemedicine visit.  I understand that I have the right to withhold or withdraw my consent to the use of telemedicine in the course of my care at any time, without affecting my right to future care or treatment, and that the Practitioner or I may terminate the telemedicine visit at any time. I understand that I have the right to inspect all information obtained  and/or recorded in the course of the telemedicine visit and may receive copies of available information for a reasonable fee.  I understand that some of the potential risks of receiving the Services via telemedicine include:  Marland Kitchen Delay or interruption in medical evaluation due to technological equipment failure or disruption; . Information transmitted may not be sufficient (e.g. poor resolution of images) to allow for appropriate medical decision making by the Practitioner; and/or  . In rare instances, security protocols could fail, causing a breach of personal health information.  Furthermore, I acknowledge that it is my responsibility to provide information about my medical history, conditions and care that is complete and accurate to the best of my ability. I acknowledge that Practitioner's advice, recommendations, and/or decision may be based on factors not within their control, such as incomplete or inaccurate data provided by me or distortions of diagnostic images or specimens that may result from electronic transmissions. I understand that the practice of medicine is not an exact science and that Practitioner makes no warranties or guarantees regarding treatment outcomes. I acknowledge that I will receive a copy of this consent concurrently upon execution via email to the email address I last provided but may also request a printed copy by calling the office of [facility].    I understand that my insurance will be billed for this visit.   I have read or had this consent read to me. . I understand the contents of this consent, which adequately explains the benefits and risks of the Services being provided via telemedicine.  . I have been provided ample opportunity to ask questions regarding this consent and the Services and have had my questions answered to my satisfaction. . I give my  informed consent for the services to be provided through the use of telemedicine in my medical care  By  participating in this telemedicine visit I agree to the above.

## 2018-11-04 NOTE — Progress Notes (Signed)
Virtual Visit via Video Note   This visit type was conducted due to national recommendations for restrictions regarding the COVID-19 Pandemic (e.g. social distancing) in an effort to limit this patient's exposure and mitigate transmission in our community.  Due to his co-morbid illnesses, this patient is at least at moderate risk for complications without adequate follow up.  This format is felt to be most appropriate for this patient at this time.  All issues noted in this document were discussed and addressed.  A limited physical exam was performed with this format.  Please refer to the patient's chart for his consent to telehealth for [facility].   Date:  11/05/2018   ID:  Derek OfficerMerllyn Bienkowski, DOB 08-29-1940, MRN [id number]  Patient Location: Home Provider Location: Home  PCP:  Kathyrn LassJones, Champ M, MD  Cardiologist:  No primary care provider on file. Leonette Tischer Electrophysiologist:  None   Evaluation Performed:  New Patient Evaluation  Chief Complaint:  Bicuspid aortic valve  History of Present Illness:    Derek Washington is a 78 y.o. male with a history of bicuspid aortic valve, aortic stenosis and aortic regurgitation.   [place] moved to [place] from [place] and [place] in [place].   Denies : Chest pain. Dizziness. Leg edema. Nitroglycerin use. Orthopnea. Palpitations. Paroxysmal nocturnal dyspnea. Shortness of breath. Syncope.   He walks regularly without sx.   The patient does not have symptoms concerning for COVID-19 infection (fever, chills, cough, or new shortness of breath).    Past Medical History:  Diagnosis Date  . Bicuspid aortic valve    LAST ECHOCARDIOGRAM APPROXIMATELY 2016? MILD AORTIC REGURG  . BPH (benign prostatic hyperplasia)   . Hard of hearing   . Heart murmur   . High cholesterol   . Hydrocele   . Hypertension   . Insomnia   . Low back pain    RIGHT  . Mild obesity   . Nocturia   . Right elbow tendonitis   . Tinnitus    Past Surgical History:   Procedure Laterality Date  . HYDROCELE EXCISION  2017   DR. [name] 2017  . left total knee replacement      2007   . right knee total replacement      2011  . SCROTAL EXPLORATION N/A 12/26/2015   Procedure: SCROTUM EXPLORATION;  Surgeon: Heloise PurpuraLester Borden, MD;  Location: [facility];  Service: Urology;  Laterality: N/A;  EXCISION OF LEFT EPIDIDYMAL CYST/SPERMATOCELE  . VASECTOMY     1968     Current Meds  Medication Sig  . amLODipine (NORVASC) 5 MG tablet Take 5 mg by mouth daily.  Marland Kitchen. atorvastatin (LIPITOR) 20 MG tablet Take 20 mg by mouth daily.  . finasteride (PROSCAR) 5 MG tablet Take 5 mg by mouth daily.  Marland Kitchen. ibuprofen (ADVIL,MOTRIN) 200 MG tablet Take 400 mg by mouth every 6 (six) hours as needed (For knee pain.).  Marland Kitchen. lisinopril-hydrochlorothiazide (PRINZIDE,ZESTORETIC) 20-12.5 MG tablet Take 1 tablet by mouth daily.  . Multiple Vitamin (MULTIVITAMIN WITH MINERALS) TABS tablet Take 1 tablet by mouth daily.  . Omega-3 Fatty Acids (FISH OIL) 1000 MG CAPS Take 1,000 mg by mouth 2 (two) times daily.     Allergies:   Patient has no known allergies.   Social History   Tobacco Use  . Smoking status: Never Smoker  . Smokeless tobacco: Never Used  Substance Use Topics  . Alcohol use: Yes    Comment: occas   . Drug use: No  Family Hx: The patient's family history includes COPD (age of onset: 8) in his father; Other (age of onset: 63) in his mother and sister.  ROS:   Please see the history of present illness.    Had cough with ACE-I All other systems reviewed and are negative.   Prior CV studies:   The following studies were reviewed today:  Echo in 2017  Labs/Other Tests and Data Reviewed:    EKG:  No ECG reviewed.  Recent Labs: No results found for requested labs within last 8760 hours.   Recent Lipid Panel No results found for: CHOL, TRIG, HDL, CHOLHDL, LDLCALC, LDLDIRECT  Wt Readings from Last 3 Encounters:  11/05/18 240 lb (108.9 kg)  12/26/15 247 lb (112  kg)  12/22/15 247 lb (112 kg)     Objective:    Vital Signs:  BP 128/73   Pulse 65   Ht 6' (1.829 m)   Wt 240 lb (108.9 kg)   BMI 32.55 kg/m    VITAL SIGNS:  reviewed RESPIRATORY:  normal respiratory effort, symmetric expansion PSYCH:  normal affect exam limited by video format, no distress  ASSESSMENT & PLAN:    1. Aortic stenosis: Bicuspid aortic valve- will plan for echo.  Can wait for virus concerns to settle.  2. HTN: The current medical regimen is effective;  continue present plan and medications. 3. Hyperlipidemia: COntinue atorvastatin.   COVID-19 Education: The signs and symptoms of COVID-19 were discussed with the patient and how to seek care for testing (follow up with PCP or arrange E-visit).  The importance of social distancing was discussed today.  Time:   Today, I have spent  minutes with the patient with telehealth technology discussing the above problems.     Medication Adjustments/Labs and Tests Ordered: Current medicines are reviewed at length with the patient today.  Concerns regarding medicines are outlined above.   Tests Ordered: No orders of the defined types were placed in this encounter.   Medication Changes: No orders of the defined types were placed in this encounter.   Disposition:  Follow up in 1 year(s)  Signed, Lance Muss, MD  11/05/2018 1:34 PM    [facility]

## 2018-11-05 ENCOUNTER — Telehealth (INDEPENDENT_AMBULATORY_CARE_PROVIDER_SITE_OTHER): Payer: Medicare Other | Admitting: Interventional Cardiology

## 2018-11-05 ENCOUNTER — Other Ambulatory Visit: Payer: Self-pay

## 2018-11-05 ENCOUNTER — Encounter: Payer: Self-pay | Admitting: Interventional Cardiology

## 2018-11-05 VITALS — BP 128/73 | HR 65 | Ht 72.0 in | Wt 240.0 lb

## 2018-11-05 DIAGNOSIS — E782 Mixed hyperlipidemia: Secondary | ICD-10-CM | POA: Diagnosis not present

## 2018-11-05 DIAGNOSIS — Q230 Congenital stenosis of aortic valve: Secondary | ICD-10-CM

## 2018-11-05 DIAGNOSIS — Q231 Congenital insufficiency of aortic valve: Secondary | ICD-10-CM | POA: Diagnosis not present

## 2018-11-05 DIAGNOSIS — I10 Essential (primary) hypertension: Secondary | ICD-10-CM

## 2018-11-05 NOTE — Patient Instructions (Addendum)
Medication Instructions:  Your physician recommends that you continue on your current medications as directed. Please refer to the Current Medication list given to you today.  If you need a refill on your cardiac medications before your next appointment, please call your pharmacy.   Lab work: None Ordered  If you have labs (blood work) drawn today and your tests are completely normal, you will receive your results only by: Marland Kitchen MyChart Message (if you have MyChart) OR . A paper copy in the mail If you have any lab test that is abnormal or we need to change your treatment, we will call you to review the results.  Testing/Procedures: Your physician has requested that you have an echocardiogram on 01/07/19 AT 9:30 am. Echocardiography is a painless test that uses sound waves to create images of your heart. It provides your doctor with information about the size and shape of your heart and how well your heart's chambers and valves are working. This procedure takes approximately one hour. There are no restrictions for this procedure.  We will get an EKG the same day as your echocardiogram.  Follow-Up: At [facility], you and your health needs are our priority.  As part of our continuing mission to provide you with exceptional heart care, we have created designated Provider Care Teams.  These Care Teams include your primary Cardiologist (physician) and Advanced Practice Providers (APPs -  Physician Assistants and Nurse Practitioners) who all work together to provide you with the care you need, when you need it. . You will need a follow up appointment in 1 year.  Please call our office 2 months in advance to schedule this appointment.  You may see Everette Rank, MD or one of the following Advanced Practice Providers on your designated Care Team:   . Robbie Lis, PA-C . Dayna Dunn, PA-C . Jacolyn Reedy, PA-C  Any Other Special Instructions Will Be Listed Below (If Applicable).

## 2019-01-07 ENCOUNTER — Other Ambulatory Visit: Payer: Self-pay

## 2019-01-07 ENCOUNTER — Other Ambulatory Visit (INDEPENDENT_AMBULATORY_CARE_PROVIDER_SITE_OTHER): Payer: Medicare Other

## 2019-01-07 ENCOUNTER — Ambulatory Visit (HOSPITAL_COMMUNITY): Payer: Medicare Other | Attending: Interventional Cardiology

## 2019-01-07 DIAGNOSIS — Q230 Congenital stenosis of aortic valve: Secondary | ICD-10-CM

## 2019-01-07 DIAGNOSIS — I10 Essential (primary) hypertension: Secondary | ICD-10-CM

## 2019-01-07 DIAGNOSIS — Q231 Congenital insufficiency of aortic valve: Secondary | ICD-10-CM | POA: Insufficient documentation

## 2019-01-07 DIAGNOSIS — E782 Mixed hyperlipidemia: Secondary | ICD-10-CM | POA: Diagnosis not present

## 2019-01-07 MED ORDER — PERFLUTREN LIPID MICROSPHERE
1.0000 mL | INTRAVENOUS | Status: AC | PRN
  Administered 2019-01-07: 2 mL via INTRAVENOUS

## 2019-02-09 ENCOUNTER — Telehealth: Payer: Self-pay | Admitting: Interventional Cardiology

## 2019-02-09 NOTE — Telephone Encounter (Signed)
STAT if patient feels like he/she is going to faint   1) Are you dizzy now? No   2) Do you feel faint or have you passed out? Felt dizzy, did not pass out  3) Do you have any other symptoms? No   4) Have you checked your HR and BP (record if available)? HR 66, did not check BP.   Patient states he feels fine right now.

## 2019-02-09 NOTE — Telephone Encounter (Signed)
Attempted top reach patient earlier and there was no answer and VM did not pick up.

## 2019-03-05 NOTE — Telephone Encounter (Signed)
Left message for patient to call back with any questions or concerns that need to be addressed. 

## 2019-05-05 ENCOUNTER — Ambulatory Visit: Payer: Medicare Other | Admitting: Orthopaedic Surgery

## 2019-05-05 ENCOUNTER — Ambulatory Visit: Payer: Medicare Other

## 2019-05-05 ENCOUNTER — Other Ambulatory Visit: Payer: Self-pay

## 2019-05-05 DIAGNOSIS — M79672 Pain in left foot: Secondary | ICD-10-CM

## 2019-05-05 MED ORDER — METHYLPREDNISOLONE 4 MG PO TABS: ORAL_TABLET | ORAL | 0 refills | Status: DC

## 2019-05-05 NOTE — Progress Notes (Signed)
Office Visit Note   Patient: Derek Washington           Date of Birth: 1940-12-06           MRN: [id number] Visit Date: 05/05/2019              Requested by: Josetta Huddle, MD [address] [place],  [place] [address] PCP: Josetta Huddle, MD   Assessment & Plan: Visit Diagnoses:  1. Pain in left foot     Plan: I do feel that he is having some type of stress response of his midfoot.  I looked at his shoes to make sure that there is no excessive wear in different parts of his shoe and I did not see that.  I recommended he keep it on flat surfaces in terms of his exercise walks.  I will have him try Voltaren gel and a steroid taper.  If he is still experiencing pain after a week or 2 I would probably recommend a cam walking boot and to shut him down more in terms of activities.  Hopefully this will run its course and his tight shoes that he just got rid of may have contributed to this.  All question concerns were answered and addressed.  If there is any issues he knows to call me.  Follow-Up Instructions: Return if symptoms worsen or fail to improve.   Orders:  Orders Placed This Encounter  Procedures  . XR Foot Complete Left   Meds ordered this encounter  Medications  . methylPREDNISolone (MEDROL) 4 MG tablet    Sig: Medrol dose pack. Take as instructed    Dispense:  21 tablet    Refill:  0      Procedures: No procedures performed   Clinical Data: No additional findings.   Subjective: Chief Complaint  Patient presents with  . Left Foot - Pain, Edema  Derek Washington is well-known to me.  He is a very active and young appearing 78 year old gentleman.  He has been having left foot pain for about 2 weeks now.  It has really gotten bad for the last few days.  He points to the midfoot as a source of his pain.  He denies any injuries.  He does a lot of exercise walking.  He also does a lot of yard work.  He said recently he has gotten rid of some shoes that were too tight on  him.  He is not a diabetic.  He is never injured this foot before.  [place] do [place] so when he does exercise walk it is going up and down a lot of hills.  He denies any numbness and tingling in his feet.  He denies any back pain.  HPI  Review of Systems He currently denies any headache, chest pain, shortness of breath, fever, chills, nausea, vomiting  Objective: Vital Signs: There were no vitals taken for this visit.  Physical Exam He is alert and orient x3 and in no acute distress Ortho Exam Examination of his left foot does show soft tissue swelling of the midfoot dorsal and medial.  When I stressed this area he does have a lot of pain.  His ankle exam is normal with full range of motion.  There is no pain in the great toe MTP joint.  His foot is well-perfused with palpable pulses.  His arch appears normal.  He has no evidence of posterior tibial tendon dysfunction.  His  Achilles exam is normal. Specialty Comments:  No specialty comments available.  Imaging: Xr Foot Complete Left  Result Date: 05/05/2019 3 views of the left foot show no acute findings.  The joint spaces in the midfoot where the patient hurts or well-maintained.  There is no periosteal reaction.  There is only slight soft tissue swelling.  The remainder of the foot x-rays appear normal.    PMFS History: Patient Active Problem List   Diagnosis Date Noted  . Bicuspid aortic valve 11/05/2018   Past Medical History:  Diagnosis Date  . Bicuspid aortic valve    LAST ECHOCARDIOGRAM APPROXIMATELY 2016? MILD AORTIC REGURG  . BPH (benign prostatic hyperplasia)   . Hard of hearing   . Heart murmur   . High cholesterol   . Hydrocele   . Hypertension   . Insomnia   . Low back pain    RIGHT  . Mild obesity   . Nocturia   . Right elbow tendonitis   . Tinnitus     Family History  Problem Relation Age of Onset  . Other Mother 38       HEAVY SMOKER  . COPD Father 51       HEAVY  SMOKER  . Other Sister 35       HEAVY SMOKER    Past Surgical History:  Procedure Laterality Date  . HYDROCELE EXCISION  2017   DR. [name] 2017  . left total knee replacement      2007   . right knee total replacement      2011  . SCROTAL EXPLORATION N/A 12/26/2015   Procedure: SCROTUM EXPLORATION;  Surgeon: [name], MD;  Location: [facility];  Service: Urology;  Laterality: N/A;  EXCISION OF LEFT EPIDIDYMAL CYST/SPERMATOCELE  . VASECTOMY     1968   Social History   Occupational History  . Occupation: RETIRED  Tobacco Use  . Smoking status: Never Smoker  . Smokeless tobacco: Never Used  Substance and Sexual Activity  . Alcohol use: Yes    Comment: occas   . Drug use: No  . Sexual activity: Not on file

## 2019-06-29 ENCOUNTER — Ambulatory Visit: Payer: Medicare Other | Attending: Internal Medicine

## 2019-06-29 DIAGNOSIS — Z23 Encounter for immunization: Secondary | ICD-10-CM | POA: Insufficient documentation

## 2019-06-29 NOTE — Progress Notes (Signed)
   Covid-19 Vaccination Clinic  Name:  Derek Washington    MRN: [id number] DOB: 17-Aug-1940  06/29/2019  Mr. [name] was observed post Covid-19 immunization for 15 minutes without incidence. He was provided with Vaccine Information Sheet and instruction to access the V-Safe system.   Mr. [name] was instructed to call 911 with any severe reactions post vaccine: Marland Kitchen Difficulty breathing  . Swelling of your face and throat  . A fast heartbeat  . A bad rash all over your body  . Dizziness and weakness    Immunizations Administered    Name Date Dose VIS Date Route   Pfizer COVID-19 Vaccine 06/29/2019 11:36 AM 0.3 mL 05/29/2019 Intramuscular   Manufacturer: [employer], [employer]   Lot: [id number]   NDC: [address]-7

## 2019-07-18 ENCOUNTER — Ambulatory Visit: Payer: Medicare Other | Attending: Internal Medicine

## 2019-07-18 DIAGNOSIS — Z23 Encounter for immunization: Secondary | ICD-10-CM | POA: Insufficient documentation

## 2019-07-18 NOTE — Progress Notes (Signed)
   Covid-19 Vaccination Clinic  Name:  Derek Washington    MRN: [id number] DOB: 1941/01/31  07/18/2019  [name] was observed post Covid-19 immunization for 15 minutes without incidence. He was provided with Vaccine Information Sheet and instruction to access the V-Safe system.   [name] was instructed to call 911 with any severe reactions post vaccine: Marland Kitchen Difficulty breathing  . Swelling of your face and throat  . A fast heartbeat  . A bad rash all over your body  . Dizziness and weakness    Immunizations Administered    Name Date Dose VIS Date Route   Pfizer COVID-19 Vaccine 07/18/2019 11:23 AM 0.3 mL 05/29/2019 Intramuscular   Manufacturer: [employer], [employer]   Lot: [id number]   NDC: [address]-5

## 2019-11-13 ENCOUNTER — Ambulatory Visit: Payer: Medicare Other | Admitting: Interventional Cardiology

## 2019-11-19 NOTE — Progress Notes (Signed)
Cardiology Office Note   Date:  11/20/2019   ID:  [name], DOB [birth year], MRN [id number]  PCP:  [name], MD    No chief complaint on file.  Bicuspid aortic valve  Wt Readings from Last 3 Encounters:  11/20/19 239 lb 6.4 oz (108.6 kg)  11/05/18 240 lb (108.9 kg)  12/26/15 247 lb (112 kg)       History of Present Illness: Derek Washington is a 79 y.o. male  with a history of bicuspid aortic valve, aortic stenosis and aortic regurgitation.   He moved to [place] from [place].  Echocardiogram in July 2020 showed: "The left ventricle has normal systolic function, with an ejection  fraction of 55-60%. The cavity size was normal. There is mild concentric  left ventricular hypertrophy. Left ventricular diastolic Doppler  parameters are consistent with impaired  relaxation. Indeterminate filling pressures No evidence of left  ventricular regional wall motion abnormalities.  2. The right ventricle has normal systolic function. The cavity was  normal. There is no increase in right ventricular wall thickness.  3. There is mild mitral annular calcification present.  4. The aortic valve has an indeterminate number of cusps. Severe  thickening of the aortic valve. Moderate calcification of the aortic  valve. Aortic valve regurgitation is trivial by color flow Doppler.  Moderate-severe stenosis of the aortic valve.  5. The aorta is abnormal in size and structure.  6. 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  7. Left atrial size was mildly dilated. "  Since the last visit, he has felt well.  He has started going back to the gym.    Denies : Chest pain. Dizziness. Leg edema. Nitroglycerin use. Orthopnea. Palpitations. Paroxysmal nocturnal dyspnea. Shortness of breath. Syncope.   Recent retina surgery which limited exercise.    BP at home is in the 130s/70 range.      Past Medical History:  Diagnosis Date  . Bicuspid aortic valve    LAST ECHOCARDIOGRAM APPROXIMATELY 2016? MILD AORTIC REGURG  . BPH (benign prostatic hyperplasia)   . Hard of hearing   . Heart murmur   . High cholesterol   . Hydrocele   . Hypertension   . Insomnia   . Low back pain    RIGHT  . Mild obesity   . Nocturia   . Right elbow tendonitis   . Tinnitus     Past Surgical History:  Procedure Laterality Date  . HYDROCELE EXCISION  2017   DR. [name] 2017  . left total knee replacement      2007   . right knee total replacement      2011  . SCROTAL EXPLORATION N/A 12/26/2015   Procedure: SCROTUM EXPLORATION;  Surgeon: [name], MD;  Location: [facility];  Service: Urology;  Laterality: N/A;  EXCISION OF LEFT EPIDIDYMAL CYST/SPERMATOCELE  . VASECTOMY     1968     Current Outpatient Medications  Medication Sig Dispense Refill  . amLODipine (NORVASC) 10 MG tablet Take 10 mg by mouth daily.    Marland Kitchen atorvastatin (LIPITOR) 20 MG tablet Take 20 mg by mouth daily.    . finasteride (PROSCAR) 5 MG tablet Take 5 mg by mouth daily.    . Multiple Vitamin (MULTIVITAMIN WITH MINERALS) TABS tablet Take 1 tablet by mouth daily.    Marland Kitchen olmesartan (BENICAR) 5 MG tablet Take 5 mg by mouth daily.    . Omega-3 Fatty Acids (FISH OIL) 1000 MG CAPS Take  1,000 mg by mouth 2 (two) times daily.     No current facility-administered medications for this visit.    Allergies:   Patient has no known allergies.    Social History:  The patient  reports that he has never smoked. He has never used smokeless tobacco. He reports current alcohol use. He reports that he does not use drugs.   Family History:  The patient's family history includes COPD (age of onset: 65) in his father; Other (age of onset: 3) in his mother and sister.    ROS:  Please see the history of present illness.   Otherwise, review of systems are positive for falls asleep easy in the late morning.   All other systems are reviewed and negative.    PHYSICAL EXAM: VS:  BP 140/66   Pulse 62    Ht 6' (1.829 m)   Wt 239 lb 6.4 oz (108.6 kg)   SpO2 96%   BMI 32.47 kg/m  , BMI Body mass index is 32.47 kg/m. GEN: Well nourished, well developed, in no acute distress  HEENT: normal  Neck: no JVD, carotid bruits, or masses Cardiac: RRR; 3/6 systolic murmur, rubs, or gallops,no edema  Respiratory:  clear to auscultation bilaterally, normal work of breathing GI: soft, nontender, nondistended, + BS MS: no deformity or atrophy ; right knee scab Skin: warm and dry, no rash Neuro:  Strength and sensation are intact Psych: euthymic mood, full affect   EKG:   The ekg ordered today demonstrates NSR, RBBB, premature beat   Recent Labs: No results found for requested labs within last 8760 hours.   Lipid Panel No results found for: CHOL, TRIG, HDL, CHOLHDL, VLDL, LDLCALC, LDLDIRECT   Other studies Reviewed: Additional studies/ records that were reviewed today with results demonstrating: PMD labs from 1/21 reviewed.   ASSESSMENT AND PLAN:  1. Aortic stenosis: Bicuspid aortic valve.  Not using SBE prophylaxis. Repeat echo in 2022, check aorta size as well.  Will plan CTA chest to eval size of aorta in 8/21. 2. HTN: The current medical regimen is effective;  continue present plan and medications.  Avoid hypotension. 3. Hyperlipidemia: The current medical regimen is effective;  continue present plan and medications. 4. Fatigue: Will check with wife if she has noted some apnea.  Falls asleep 1-2 hours after waking.  If this is noted, we can set him up for sleep study.     Current medicines are reviewed at length with the patient today.  The patient concerns regarding his medicines were addressed.  The following changes have been made:  No change  Labs/ tests ordered today include:  No orders of the defined types were placed in this encounter.   Recommend 150 minutes/week of aerobic exercise Low fat, low carb, high fiber diet recommended  Disposition:   FU in 1  year   Signed, Larae Grooms, MD  11/20/2019 11:43 AM    [facility] [address], [place], [place]  [address] Phone: [phone]; Fax: [phone]

## 2019-11-20 ENCOUNTER — Encounter: Payer: Self-pay | Admitting: Interventional Cardiology

## 2019-11-20 ENCOUNTER — Other Ambulatory Visit: Payer: Self-pay

## 2019-11-20 ENCOUNTER — Ambulatory Visit: Payer: Medicare Other | Admitting: Interventional Cardiology

## 2019-11-20 VITALS — BP 140/66 | HR 62 | Ht 72.0 in | Wt 239.4 lb

## 2019-11-20 DIAGNOSIS — E782 Mixed hyperlipidemia: Secondary | ICD-10-CM

## 2019-11-20 DIAGNOSIS — Q231 Congenital insufficiency of aortic valve: Secondary | ICD-10-CM | POA: Diagnosis not present

## 2019-11-20 DIAGNOSIS — I10 Essential (primary) hypertension: Secondary | ICD-10-CM

## 2019-11-20 DIAGNOSIS — Q230 Congenital stenosis of aortic valve: Secondary | ICD-10-CM

## 2019-11-20 DIAGNOSIS — I712 Thoracic aortic aneurysm, without rupture, unspecified: Secondary | ICD-10-CM

## 2019-11-20 NOTE — Patient Instructions (Signed)
Medication Instructions:  Your physician recommends that you continue on your current medications as directed. Please refer to the Current Medication list given to you today.  *If you need a refill on your cardiac medications before your next appointment, please call your pharmacy*   Lab Work: Your physician recommends that you return for lab work Designer, jewellery) prior to your CT scan   If you have labs (blood work) drawn today and your tests are completely normal, you will receive your results only by: Marland Kitchen MyChart Message (if you have MyChart) OR . A paper copy in the mail If you have any lab test that is abnormal or we need to change your treatment, we will call you to review the results.   Testing/Procedures: Your provider would like for you to have a CTA of the Chest to look at your Aorta in [place]. CT Angiography (CTA), is a special type of CT scan that uses a computer to produce multi-dimensional views of major blood vessels throughout the body. In CT angiography, a contrast material is injected through an IV to help visualize the blood vessels    Follow-Up: At [facility], you and your health needs are our priority.  As part of our continuing mission to provide you with exceptional heart care, we have created designated Provider Care Teams.  These Care Teams include your primary Cardiologist (physician) and Advanced Practice Providers (APPs -  Physician Assistants and Nurse Practitioners) who all work together to provide you with the care you need, when you need it.  We recommend signing up for the patient portal called "MyChart".  Sign up information is provided on this After Visit Summary.  MyChart is used to connect with patients for Virtual Visits (Telemedicine).  Patients are able to view lab/test results, encounter notes, upcoming appointments, etc.  Non-urgent messages can be sent to your provider as well.   To learn more about what you can do with MyChart, go to  [email or web address].    Your next appointment:   July 2022  The format for your next appointment:   In Person  Provider:   You may see Lance Muss, MD or one of the following Advanced Practice Providers on your designated Care Team:    Ronie Spies, PA-C  Jacolyn Reedy, PA-C    Other Instructions None

## 2020-01-14 ENCOUNTER — Other Ambulatory Visit: Payer: Medicare Other | Admitting: *Deleted

## 2020-01-14 ENCOUNTER — Other Ambulatory Visit: Payer: Self-pay

## 2020-01-14 DIAGNOSIS — E782 Mixed hyperlipidemia: Secondary | ICD-10-CM

## 2020-01-14 DIAGNOSIS — I712 Thoracic aortic aneurysm, without rupture, unspecified: Secondary | ICD-10-CM

## 2020-01-14 DIAGNOSIS — I10 Essential (primary) hypertension: Secondary | ICD-10-CM

## 2020-01-14 DIAGNOSIS — Q2381 Bicuspid aortic valve: Secondary | ICD-10-CM

## 2020-01-14 DIAGNOSIS — Q230 Congenital stenosis of aortic valve: Secondary | ICD-10-CM

## 2020-01-14 DIAGNOSIS — I350 Nonrheumatic aortic (valve) stenosis: Secondary | ICD-10-CM

## 2020-01-14 DIAGNOSIS — Q231 Congenital insufficiency of aortic valve: Secondary | ICD-10-CM

## 2020-01-14 LAB — BASIC METABOLIC PANEL
BUN/Creatinine Ratio: 25 — ABNORMAL HIGH (ref 10–24)
BUN: 24 mg/dL (ref 8–27)
CO2: 22 mmol/L (ref 20–29)
Calcium: 10 mg/dL (ref 8.6–10.2)
Chloride: 103 mmol/L (ref 96–106)
Creatinine, Ser: 0.95 mg/dL (ref 0.76–1.27)
GFR calc Af Amer: 88 mL/min/{1.73_m2} (ref >59)
GFR calc non Af Amer: 76 mL/min/{1.73_m2} (ref >59)
Glucose: 90 mg/dL (ref 65–99)
Potassium: 4.1 mmol/L (ref 3.5–5.2)
Sodium: 141 mmol/L (ref 134–144)

## 2020-01-18 ENCOUNTER — Telehealth: Payer: Self-pay | Admitting: Interventional Cardiology

## 2020-01-18 ENCOUNTER — Other Ambulatory Visit: Payer: Self-pay

## 2020-01-18 ENCOUNTER — Ambulatory Visit (INDEPENDENT_AMBULATORY_CARE_PROVIDER_SITE_OTHER)
Admission: RE | Admit: 2020-01-18 | Discharge: 2020-01-18 | Disposition: A | Discharge status: Home / Self Care | Payer: Medicare Other | Source: Ambulatory Visit | Attending: Interventional Cardiology | Admitting: Interventional Cardiology

## 2020-01-18 DIAGNOSIS — I712 Thoracic aortic aneurysm, without rupture, unspecified: Secondary | ICD-10-CM

## 2020-01-18 MED ORDER — IOHEXOL 350 MG/ML SOLN
100.0000 mL | Freq: Once | INTRAVENOUS | Status: AC | PRN
  Administered 2020-01-18: 100 mL via INTRAVENOUS

## 2020-01-18 NOTE — Telephone Encounter (Signed)
Called patient back and spoke with his wife. Patient's lab results were normal. He will call back if he has any other questions.

## 2020-01-18 NOTE — Telephone Encounter (Signed)
Patient is calling to get lab results. Please call back.

## 2020-10-06 ENCOUNTER — Telehealth: Payer: Self-pay | Admitting: Interventional Cardiology

## 2020-10-06 DIAGNOSIS — Q231 Congenital insufficiency of aortic valve: Secondary | ICD-10-CM

## 2020-10-06 DIAGNOSIS — Q230 Congenital stenosis of aortic valve: Secondary | ICD-10-CM

## 2020-10-06 NOTE — Telephone Encounter (Signed)
Spoke with pt and has noted for 2-3 weeks or longer SOB with walking and going up stairs "feels tired and exhausted" Per pt feels fine when just sitting and no swelling noted  Last echo was done  12/2018 shows moderate to severe AS Appt made with Dr Eldridge Dace  For 10/31/20 at 11:40 Will forward to Dr Eldridge Dace for review and recommendations . Encouraged if has worsening symptoms to go to ED for eval and tx./cy

## 2020-10-06 NOTE — Telephone Encounter (Signed)
WOuld get an echo scheduled for him ASAP for aortic stenosis, preferably before I see him.   JV

## 2020-10-06 NOTE — Telephone Encounter (Signed)
Pt c/o Shortness Of Breath: STAT if SOB developed within the last 24 hours or pt is noticeably SOB on the phone  1. Are you currently SOB (can you hear that pt is SOB on the phone)? No   2. How long have you been experiencing SOB? 2-3 weeks  3. Are you SOB when sitting or when up moving around? With exertion   4. Are you currently experiencing any other symptoms?  Lightheadedness (denies dizziness)  04/21: BP- 136/68 HR- 61

## 2020-10-07 NOTE — Telephone Encounter (Signed)
Spoke with pt and advised of Dr Hoyle Barr recommendation to schedule echo.  Pt verbalizes understanding and is agreeable.  Pt advised scheduler will call.  Order placed.

## 2020-10-27 ENCOUNTER — Ambulatory Visit (HOSPITAL_COMMUNITY)
Admission: RE | Admit: 2020-10-27 | Discharge: 2020-10-27 | Disposition: A | Discharge status: Home / Self Care | Payer: Medicare Other | Source: Ambulatory Visit | Attending: Interventional Cardiology | Admitting: Interventional Cardiology

## 2020-10-27 ENCOUNTER — Other Ambulatory Visit: Payer: Self-pay

## 2020-10-27 DIAGNOSIS — Q231 Congenital insufficiency of aortic valve: Secondary | ICD-10-CM | POA: Diagnosis not present

## 2020-10-27 DIAGNOSIS — I350 Nonrheumatic aortic (valve) stenosis: Secondary | ICD-10-CM | POA: Diagnosis not present

## 2020-10-27 DIAGNOSIS — I352 Nonrheumatic aortic (valve) stenosis with insufficiency: Secondary | ICD-10-CM | POA: Diagnosis present

## 2020-10-27 DIAGNOSIS — Q230 Congenital stenosis of aortic valve: Secondary | ICD-10-CM | POA: Diagnosis not present

## 2020-10-27 LAB — ECHOCARDIOGRAM COMPLETE
AR max vel: 1.65 cm2
AV Area VTI: 1.7 cm2
AV Area mean vel: 1.55 cm2
AV Mean grad: 36 mmHg
AV Peak grad: 55.1 mmHg
Ao pk vel: 3.71 m/s
Area-P 1/2: 2.42 cm2
P 1/2 time: 595 msec
S' Lateral: 3.6 cm

## 2020-10-30 NOTE — H&P (View-Only) (Signed)
Cardiology Office Note   Date:  10/31/2020   ID:  Derek Washington, DOB 1940/07/06, MRN [id number]  PCP:  [name], MD    No chief complaint on file.  Aortic stenosis  Wt Readings from Last 3 Encounters:  10/31/20 238 lb 9.6 oz (108.2 kg)  11/20/19 239 lb 6.4 oz (108.6 kg)  11/05/18 240 lb (108.9 kg)       History of Present Illness: Derek Washington is a 80 y.o. male  with a history of bicuspid aortic valve, aortic stenosis and aortic regurgitation.  He moved to [place] from [place].  Echocardiogram in July 2020 showed: "The left ventricle has normal systolic function, with an ejection  fraction of 55-60%. The cavity size was normal. There is mild concentric  left ventricular hypertrophy. Left ventricular diastolic Doppler  parameters are consistent with impaired  relaxation. Indeterminate filling pressures No evidence of left  ventricular regional wall motion abnormalities.  2. The right ventricle has normal systolic function. The cavity was  normal. There is no increase in right ventricular wall thickness.  3. There is mild mitral annular calcification present.  4. The aortic valve has an indeterminate number of cusps. Severe  thickening of the aortic valve. Moderate calcification of the aortic  valve. Aortic valve regurgitation is trivial by color flow Doppler.  Moderate-severe stenosis of the aortic valve.  5. The aorta is abnormal in size and structure.  6. 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  7. Left atrial size was mildly dilated. "  In 2021, it was noted he had started going back to the gym.    He reported more shortness of breath in 2022. Repeat echo showed Mean gradient increased to 36 mm.  Ao root was 44.   I spoke to his son-in-law, Dr. Margo Aye, who explained that the patient has had some fatigue, DOE and two "stumbles" where the details are unclear whether he lost consciousness. He does not think he blacked  out. On one occasion, he  fell into a chair and it broke.  Wife noted that when they walk, he will have some chest pressure once the HR goes to 115.  Not every time he walks.  Sx resolve when he slows down and HR drops.   Has occasional PND.   Denies : Leg edema. Nitroglycerin use. Orthopnea. Palpitations.        Past Medical History:  Diagnosis Date  . Bicuspid aortic valve    LAST ECHOCARDIOGRAM APPROXIMATELY 2016? MILD AORTIC REGURG  . BPH (benign prostatic hyperplasia)   . Hard of hearing   . Heart murmur   . High cholesterol   . Hydrocele   . Hypertension   . Insomnia   . Low back pain    RIGHT  . Mild obesity   . Nocturia   . Right elbow tendonitis   . Tinnitus     Past Surgical History:  Procedure Laterality Date  . HYDROCELE EXCISION  2017   DR. [name] 2017  . left total knee replacement      2007   . right knee total replacement      2011  . SCROTAL EXPLORATION N/A 12/26/2015   Procedure: SCROTUM EXPLORATION;  Surgeon: [name], MD;  Location: [facility];  Service: Urology;  Laterality: N/A;  EXCISION OF LEFT EPIDIDYMAL CYST/SPERMATOCELE  . VASECTOMY     1968     Current Outpatient Medications  Medication Sig Dispense Refill  . amLODipine (  NORVASC) 10 MG tablet Take 10 mg by mouth daily.    Marland Kitchen atorvastatin (LIPITOR) 20 MG tablet Take 20 mg by mouth daily.    . finasteride (PROSCAR) 5 MG tablet Take 5 mg by mouth daily.    . Multiple Vitamin (MULTIVITAMIN WITH MINERALS) TABS tablet Take 1 tablet by mouth daily.    Marland Kitchen olmesartan (BENICAR) 5 MG tablet Take 5 mg by mouth daily.    . Omega-3 Fatty Acids (FISH OIL) 1000 MG CAPS Take 1,000 mg by mouth 2 (two) times daily.     No current facility-administered medications for this visit.    Allergies:   Hydrochlorothiazide    Social History:  The patient  reports that he has never smoked. He has never used smokeless tobacco. He reports current alcohol use. He reports that he does not use drugs.    Family History:  The patient's family history includes COPD (age of onset: 1) in his father; Other (age of onset: 64) in his mother and sister.    ROS:  Please see the history of present illness.   Otherwise, review of systems are positive for falls, some DOE which is new; and occasional chest pressure.   All other systems are reviewed and negative.    PHYSICAL EXAM: VS:  BP (!) 156/84   Pulse 60   Ht 6' (1.829 m)   Wt 238 lb 9.6 oz (108.2 kg)   SpO2 95%   BMI 32.36 kg/m  , BMI Body mass index is 32.36 kg/m. GEN: Well nourished, well developed, in no acute distress  HEENT: normal  Neck: no JVD, carotid bruits, or masses Cardiac: RRR; no murmurs, rubs, or gallops,no edema  Respiratory:  clear to auscultation bilaterally, normal work of breathing GI: soft, nontender, nondistended, + BS MS: no deformity or atrophy  Skin: warm and dry, no rash Neuro:  Strength and sensation are intact Psych: euthymic mood, full affect   EKG:   The ekg ordered today demonstrates NSR, prolonged PR, RBBB   Recent Labs: 01/14/2020: BUN 24; Creatinine, Ser 0.95; Potassium 4.1; Sodium 141   Lipid Panel No results found for: CHOL, TRIG, HDL, CHOLHDL, VLDL, LDLCALC, LDLDIRECT   Other studies Reviewed: Additional studies/ records that were reviewed today with results demonstrating: echo images personally reviewed.   ASSESSMENT AND PLAN:  1. Ao stenosis: Mod to severe bicuspid valve aortic stenosis by echo.  Plan for diagnostic cardiac cath to eval severity of AS and need for cardiac surgery.  Now with sx that could be anginal vs. Valve related.  Presyncopel/falls are most worrisome sign in relation to valve.  With dilated aortic root, bicuspid valve as well and symptoms, he needs a cath done soon.  I suspect he will need Ao Valve replacement so we will set up consultation with Dr. Laneta Simmers.  I did explain that TAVR my not be an option due to bicuspid valve.  2. HTN: Higher today.  Controlled on October 14, 2020.  Do not want to add vasodilators given severity of AS.  3. Hyperlipidemia: COntinue atorvastatin. LDL 94 in 2022. 4. Fatigue: we considered sleep apnea in the past. Given that his sx are now exertional, I think this is more likely related to aortic stenosis.   Cardiac catheterization was discussed with the patient fully. The patient understands that risks include but are not limited to stroke (1 in 1000), death (1 in 1000), kidney failure [usually temporary] (1 in 500), bleeding (1 in 200), allergic reaction [possibly serious] (1  in 200).  The patient understands and is willing to proceed.     Current medicines are reviewed at length with the patient today.  The patient concerns regarding his medicines were addressed.  The following changes have been made:  No change  Labs/ tests ordered today include:   Orders Placed This Encounter  Procedures  . CBC  . Basic Metabolic Panel (BMET)  . Ambulatory referral to Cardiothoracic Surgery  . EKG 12-Lead    Recommend 150 minutes/week of aerobic exercise Low fat, low carb, high fiber diet recommended  Disposition:   FU based on cath result   Signed, Lance Muss, MD  10/31/2020 12:26 PM    [facility] [address] [place], [place], [place]  [address] Phone: [phone]; Fax: [phone]

## 2020-10-30 NOTE — Progress Notes (Signed)
Cardiology Office Note   Date:  10/31/2020   ID:  Derek Washington, DOB 1940/07/06, MRN [id number]  PCP:  [name], MD    No chief complaint on file.  Aortic stenosis  Wt Readings from Last 3 Encounters:  10/31/20 238 lb 9.6 oz (108.2 kg)  11/20/19 239 lb 6.4 oz (108.6 kg)  11/05/18 240 lb (108.9 kg)       History of Present Illness: Derek Washington is a 80 y.o. male  with a history of bicuspid aortic valve, aortic stenosis and aortic regurgitation.  He moved to [place] from [place].  Echocardiogram in July 2020 showed: "The left ventricle has normal systolic function, with an ejection  fraction of 55-60%. The cavity size was normal. There is mild concentric  left ventricular hypertrophy. Left ventricular diastolic Doppler  parameters are consistent with impaired  relaxation. Indeterminate filling pressures No evidence of left  ventricular regional wall motion abnormalities.  2. The right ventricle has normal systolic function. The cavity was  normal. There is no increase in right ventricular wall thickness.  3. There is mild mitral annular calcification present.  4. The aortic valve has an indeterminate number of cusps. Severe  thickening of the aortic valve. Moderate calcification of the aortic  valve. Aortic valve regurgitation is trivial by color flow Doppler.  Moderate-severe stenosis of the aortic valve.  5. The aorta is abnormal in size and structure.  6. 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  7. Left atrial size was mildly dilated. "  In 2021, it was noted he had started going back to the gym.    He reported more shortness of breath in 2022. Repeat echo showed Mean gradient increased to 36 mm.  Ao root was 44.   I spoke to his son-in-law, Dr. Margo Aye, who explained that the patient has had some fatigue, DOE and two "stumbles" where the details are unclear whether he lost consciousness. He does not think he blacked  out. On one occasion, he  fell into a chair and it broke.  Wife noted that when they walk, he will have some chest pressure once the HR goes to 115.  Not every time he walks.  Sx resolve when he slows down and HR drops.   Has occasional PND.   Denies : Leg edema. Nitroglycerin use. Orthopnea. Palpitations.        Past Medical History:  Diagnosis Date  . Bicuspid aortic valve    LAST ECHOCARDIOGRAM APPROXIMATELY 2016? MILD AORTIC REGURG  . BPH (benign prostatic hyperplasia)   . Hard of hearing   . Heart murmur   . High cholesterol   . Hydrocele   . Hypertension   . Insomnia   . Low back pain    RIGHT  . Mild obesity   . Nocturia   . Right elbow tendonitis   . Tinnitus     Past Surgical History:  Procedure Laterality Date  . HYDROCELE EXCISION  2017   DR. [name] 2017  . left total knee replacement      2007   . right knee total replacement      2011  . SCROTAL EXPLORATION N/A 12/26/2015   Procedure: SCROTUM EXPLORATION;  Surgeon: [name], MD;  Location: [facility];  Service: Urology;  Laterality: N/A;  EXCISION OF LEFT EPIDIDYMAL CYST/SPERMATOCELE  . VASECTOMY     1968     Current Outpatient Medications  Medication Sig Dispense Refill  . amLODipine (  NORVASC) 10 MG tablet Take 10 mg by mouth daily.    Marland Kitchen atorvastatin (LIPITOR) 20 MG tablet Take 20 mg by mouth daily.    . finasteride (PROSCAR) 5 MG tablet Take 5 mg by mouth daily.    . Multiple Vitamin (MULTIVITAMIN WITH MINERALS) TABS tablet Take 1 tablet by mouth daily.    Marland Kitchen olmesartan (BENICAR) 5 MG tablet Take 5 mg by mouth daily.    . Omega-3 Fatty Acids (FISH OIL) 1000 MG CAPS Take 1,000 mg by mouth 2 (two) times daily.     No current facility-administered medications for this visit.    Allergies:   Hydrochlorothiazide    Social History:  The patient  reports that he has never smoked. He has never used smokeless tobacco. He reports current alcohol use. He reports that he does not use drugs.    Family History:  The patient's family history includes COPD (age of onset: 1) in his father; Other (age of onset: 64) in his mother and sister.    ROS:  Please see the history of present illness.   Otherwise, review of systems are positive for falls, some DOE which is new; and occasional chest pressure.   All other systems are reviewed and negative.    PHYSICAL EXAM: VS:  BP (!) 156/84   Pulse 60   Ht 6' (1.829 m)   Wt 238 lb 9.6 oz (108.2 kg)   SpO2 95%   BMI 32.36 kg/m  , BMI Body mass index is 32.36 kg/m. GEN: Well nourished, well developed, in no acute distress  HEENT: normal  Neck: no JVD, carotid bruits, or masses Cardiac: RRR; no murmurs, rubs, or gallops,no edema  Respiratory:  clear to auscultation bilaterally, normal work of breathing GI: soft, nontender, nondistended, + BS MS: no deformity or atrophy  Skin: warm and dry, no rash Neuro:  Strength and sensation are intact Psych: euthymic mood, full affect   EKG:   The ekg ordered today demonstrates NSR, prolonged PR, RBBB   Recent Labs: 01/14/2020: BUN 24; Creatinine, Ser 0.95; Potassium 4.1; Sodium 141   Lipid Panel No results found for: CHOL, TRIG, HDL, CHOLHDL, VLDL, LDLCALC, LDLDIRECT   Other studies Reviewed: Additional studies/ records that were reviewed today with results demonstrating: echo images personally reviewed.   ASSESSMENT AND PLAN:  1. Ao stenosis: Mod to severe bicuspid valve aortic stenosis by echo.  Plan for diagnostic cardiac cath to eval severity of AS and need for cardiac surgery.  Now with sx that could be anginal vs. Valve related.  Presyncopel/falls are most worrisome sign in relation to valve.  With dilated aortic root, bicuspid valve as well and symptoms, he needs a cath done soon.  I suspect he will need Ao Valve replacement so we will set up consultation with Dr. Laneta Simmers.  I did explain that TAVR my not be an option due to bicuspid valve.  2. HTN: Higher today.  Controlled on October 14, 2020.  Do not want to add vasodilators given severity of AS.  3. Hyperlipidemia: COntinue atorvastatin. LDL 94 in 2022. 4. Fatigue: we considered sleep apnea in the past. Given that his sx are now exertional, I think this is more likely related to aortic stenosis.   Cardiac catheterization was discussed with the patient fully. The patient understands that risks include but are not limited to stroke (1 in 1000), death (1 in 1000), kidney failure [usually temporary] (1 in 500), bleeding (1 in 200), allergic reaction [possibly serious] (1  in 200).  The patient understands and is willing to proceed.     Current medicines are reviewed at length with the patient today.  The patient concerns regarding his medicines were addressed.  The following changes have been made:  No change  Labs/ tests ordered today include:   Orders Placed This Encounter  Procedures  . CBC  . Basic Metabolic Panel (BMET)  . Ambulatory referral to Cardiothoracic Surgery  . EKG 12-Lead    Recommend 150 minutes/week of aerobic exercise Low fat, low carb, high fiber diet recommended  Disposition:   FU based on cath result   Signed, Lance Muss, MD  10/31/2020 12:26 PM    [facility] [address] [place], [place], [place]  [address] Phone: [phone]; Fax: [phone]

## 2020-10-31 ENCOUNTER — Other Ambulatory Visit: Payer: Self-pay

## 2020-10-31 ENCOUNTER — Ambulatory Visit: Payer: Medicare Other | Admitting: Interventional Cardiology

## 2020-10-31 ENCOUNTER — Encounter: Payer: Self-pay | Admitting: Interventional Cardiology

## 2020-10-31 VITALS — BP 156/84 | HR 60 | Ht 72.0 in | Wt 238.6 lb

## 2020-10-31 DIAGNOSIS — I10 Essential (primary) hypertension: Secondary | ICD-10-CM

## 2020-10-31 DIAGNOSIS — Q231 Congenital insufficiency of aortic valve: Secondary | ICD-10-CM | POA: Diagnosis not present

## 2020-10-31 DIAGNOSIS — Q230 Congenital stenosis of aortic valve: Secondary | ICD-10-CM

## 2020-10-31 DIAGNOSIS — I712 Thoracic aortic aneurysm, without rupture, unspecified: Secondary | ICD-10-CM

## 2020-10-31 DIAGNOSIS — E782 Mixed hyperlipidemia: Secondary | ICD-10-CM

## 2020-10-31 DIAGNOSIS — Z01818 Encounter for other preprocedural examination: Secondary | ICD-10-CM | POA: Diagnosis not present

## 2020-10-31 DIAGNOSIS — Z01812 Encounter for preprocedural laboratory examination: Secondary | ICD-10-CM

## 2020-10-31 LAB — CBC
Hematocrit: 40.6 % (ref 37.5–51.0)
Hemoglobin: 13.6 g/dL (ref 13.0–17.7)
MCH: 32 pg (ref 26.6–33.0)
MCHC: 33.5 g/dL (ref 31.5–35.7)
MCV: 96 fL (ref 79–97)
Platelets: 193 10*3/uL (ref 150–450)
RBC: 4.25 x10E6/uL (ref 4.14–5.80)
RDW: 13.7 % (ref 11.6–15.4)
WBC: 6.4 10*3/uL (ref 3.4–10.8)

## 2020-10-31 LAB — BASIC METABOLIC PANEL
BUN/Creatinine Ratio: 17 (ref 10–24)
BUN: 19 mg/dL (ref 8–27)
CO2: 24 mmol/L (ref 20–29)
Calcium: 10 mg/dL (ref 8.6–10.2)
Chloride: 104 mmol/L (ref 96–106)
Creatinine, Ser: 1.1 mg/dL (ref 0.76–1.27)
Glucose: 82 mg/dL (ref 65–99)
Potassium: 4.4 mmol/L (ref 3.5–5.2)
Sodium: 142 mmol/L (ref 134–144)
eGFR: 69 mL/min/{1.73_m2} (ref >59)

## 2020-10-31 NOTE — Patient Instructions (Addendum)
Medication Instructions:  Your physician recommends that you continue on your current medications as directed. Please refer to the Current Medication list given to you today.  *If you need a refill on your cardiac medications before your next appointment, please call your pharmacy*   Lab Work:  lab work to be done today--CBC and BMP If you have labs (blood work) drawn today and your tests are completely normal, you will receive your results only by: Marland Kitchen MyChart Message (if you have MyChart) OR . A paper copy in the mail If you have any lab test that is abnormal or we need to change your treatment, we will call you to review the results.   Testing/Procedures:  Your physician has requested that you have a cardiac catheterization. Cardiac catheterization is used to diagnose and/or treat various heart conditions. Doctors may recommend this procedure for a number of different reasons. The most common reason is to evaluate chest pain. Chest pain can be a symptom of coronary artery disease (CAD), and cardiac catheterization can show whether plaque is narrowing or blocking your heart's arteries. This procedure is also used to evaluate the valves, as well as measure the blood flow and oxygen levels in different parts of your heart. For further information please visit [email or web address] Please follow instruction sheet, as given.  Scheduled for May 19,2022   Follow-Up: At [facility], you and your health needs are our priority.  As part of our continuing mission to provide you with exceptional heart care, we have created designated Provider Care Teams.  These Care Teams include your primary Cardiologist (physician) and Advanced Practice Providers (APPs -  Physician Assistants and Nurse Practitioners) who all work together to provide you with the care you need, when you need it.  We recommend signing up for the patient portal called "MyChart".  Sign up information is provided on this After Visit  Summary.  MyChart is used to connect with patients for Virtual Visits (Telemedicine).  Patients are able to view lab/test results, encounter notes, upcoming appointments, etc.  Non-urgent messages can be sent to your provider as well.   To learn more about what you can do with MyChart, go to [email or web address].    Your next appointment:   To be arranged after procedure  The format for your next appointment:   In Person  Provider:   You may see Derek Muss, MD or one of the following Advanced Practice Providers on your designated Care Team:    Derek Spies, PA-C  Derek Reedy, PA-C    Other Instructions    [facility] CARDIOVASCULAR DIVISION [facility] ST OFFICE [name] [place] [address] Dept: [phone] Loc: [phone]  Derek Washington  10/31/2020  You are scheduled for a Cardiac Catheterization on Thursday, May 19 with Dr. Lance Washington.  1. Please arrive at the [facility] (Main Entrance A) at [facility]: [address] [place], [place] [address] at 8:30 AM (This time is two hours before your procedure to ensure your preparation). Free valet parking service is available.   Special note: Every effort is made to have your procedure done on time. Please understand that emergencies sometimes delay scheduled procedures.  2. Diet: Do not eat solid foods after midnight.  The patient may have clear liquids until 5am upon the day of the procedure.  3. Labs: done in office on 5/16  4. Medication instructions in preparation for your procedure:   Contrast Allergy: No  On the morning of your procedure, take  Aspirin 81 mg and any morning medicines NOT listed above.  You may use sips of water.  5. Plan for one night stay--bring personal belongings. 6. Bring a current list of your medications and current insurance cards. 7. You MUST have a responsible person to drive you home. 8. Someone MUST  be with you the first 24 hours after you arrive home or your discharge will be delayed. 9. Please wear clothes that are easy to get on and off and wear slip-on shoes.  Thank you for allowing [place] to care for you!   -- [facility]  Please mask in public and practice hand hygiene  in the 3 days prior to procdure. Contact office if any COVID-19-like illness or household contacts to COVID-19 to determine need for testing

## 2020-11-02 ENCOUNTER — Telehealth: Payer: Self-pay | Admitting: *Deleted

## 2020-11-02 NOTE — Telephone Encounter (Signed)
Pt contacted pre-catheterization scheduled at [facility] for: Thursday Nov 03, 2020 10:30 AM Verified arrival time and place: [facility] Main Entrance A [facility]) at: 8:30 AM   No solid food after midnight prior to cath, clear liquids until 5 AM day of procedure.   AM meds can be  taken pre-cath with sips of water including: ASA 81 mg   Confirmed patient has responsible adult to drive home post procedure and be with patient first 24 hours after arriving home: yes  You are allowed ONE visitor in the waiting room during the time you are at the hospital for your procedure. Both you and your visitor must wear a mask once you enter the hospital.    Reviewed procedure/mask/visitor instructions with patient's wife (DPR).  Pt's wife reports patient does currently have any symptoms concerning for COVID-19 and no household members with COVID-19 like illness.

## 2020-11-03 ENCOUNTER — Ambulatory Visit (HOSPITAL_COMMUNITY)
Admission: RE | Admit: 2020-11-03 | Discharge: 2020-11-03 | Disposition: A | Discharge status: Home / Self Care | Payer: Medicare Other | Attending: Cardiovascular Disease | Admitting: Cardiovascular Disease

## 2020-11-03 ENCOUNTER — Other Ambulatory Visit: Payer: Self-pay

## 2020-11-03 ENCOUNTER — Encounter (HOSPITAL_COMMUNITY)
Admission: RE | Disposition: A | Discharge status: Home / Self Care | Payer: Self-pay | Source: Home / Self Care | Attending: Cardiovascular Disease

## 2020-11-03 ENCOUNTER — Encounter (HOSPITAL_COMMUNITY): Payer: Self-pay | Admitting: Cardiovascular Disease

## 2020-11-03 DIAGNOSIS — Z888 Allergy status to other drugs, medicaments and biological substances status: Secondary | ICD-10-CM | POA: Insufficient documentation

## 2020-11-03 DIAGNOSIS — I2510 Atherosclerotic heart disease of native coronary artery without angina pectoris: Secondary | ICD-10-CM | POA: Diagnosis not present

## 2020-11-03 DIAGNOSIS — N400 Enlarged prostate without lower urinary tract symptoms: Secondary | ICD-10-CM | POA: Insufficient documentation

## 2020-11-03 DIAGNOSIS — Z9852 Vasectomy status: Secondary | ICD-10-CM | POA: Insufficient documentation

## 2020-11-03 DIAGNOSIS — I350 Nonrheumatic aortic (valve) stenosis: Secondary | ICD-10-CM | POA: Insufficient documentation

## 2020-11-03 DIAGNOSIS — Z6832 Body mass index (BMI) 32.0-32.9, adult: Secondary | ICD-10-CM | POA: Insufficient documentation

## 2020-11-03 DIAGNOSIS — R5383 Other fatigue: Secondary | ICD-10-CM | POA: Insufficient documentation

## 2020-11-03 DIAGNOSIS — Z96653 Presence of artificial knee joint, bilateral: Secondary | ICD-10-CM | POA: Insufficient documentation

## 2020-11-03 DIAGNOSIS — E7800 Pure hypercholesterolemia, unspecified: Secondary | ICD-10-CM | POA: Insufficient documentation

## 2020-11-03 DIAGNOSIS — E669 Obesity, unspecified: Secondary | ICD-10-CM | POA: Diagnosis not present

## 2020-11-03 DIAGNOSIS — H9190 Unspecified hearing loss, unspecified ear: Secondary | ICD-10-CM | POA: Diagnosis not present

## 2020-11-03 DIAGNOSIS — Q231 Congenital insufficiency of aortic valve: Secondary | ICD-10-CM

## 2020-11-03 DIAGNOSIS — I10 Essential (primary) hypertension: Secondary | ICD-10-CM | POA: Insufficient documentation

## 2020-11-03 DIAGNOSIS — Z79899 Other long term (current) drug therapy: Secondary | ICD-10-CM | POA: Diagnosis not present

## 2020-11-03 HISTORY — PX: RIGHT/LEFT HEART CATH AND CORONARY ANGIOGRAPHY: CATH118266

## 2020-11-03 LAB — POCT I-STAT EG7
Acid-Base Excess: 1 mmol/L (ref 0.0–2.0)
Acid-Base Excess: 2 mmol/L (ref 0.0–2.0)
Bicarbonate: 25.3 mmol/L (ref 20.0–28.0)
Bicarbonate: 25.5 mmol/L (ref 20.0–28.0)
Calcium, Ion: 1.25 mmol/L (ref 1.15–1.40)
Calcium, Ion: 1.25 mmol/L (ref 1.15–1.40)
HCT: 35 % — ABNORMAL LOW (ref 39.0–52.0)
HCT: 36 % — ABNORMAL LOW (ref 39.0–52.0)
Hemoglobin: 11.9 g/dL — ABNORMAL LOW (ref 13.0–17.0)
Hemoglobin: 12.2 g/dL — ABNORMAL LOW (ref 13.0–17.0)
O2 Saturation: 75 %
O2 Saturation: 76 %
Potassium: 3.7 mmol/L (ref 3.5–5.1)
Potassium: 3.7 mmol/L (ref 3.5–5.1)
Sodium: 143 mmol/L (ref 135–145)
Sodium: 143 mmol/L (ref 135–145)
TCO2: 26 mmol/L (ref 22–32)
TCO2: 27 mmol/L (ref 22–32)
pCO2, Ven: 36.7 mmHg — ABNORMAL LOW (ref 44.0–60.0)
pCO2, Ven: 36.9 mmHg — ABNORMAL LOW (ref 44.0–60.0)
pH, Ven: 7.447 — ABNORMAL HIGH (ref 7.250–7.430)
pH, Ven: 7.448 — ABNORMAL HIGH (ref 7.250–7.430)
pO2, Ven: 38 mmHg (ref 32.0–45.0)
pO2, Ven: 39 mmHg (ref 32.0–45.0)

## 2020-11-03 LAB — POCT I-STAT 7, (LYTES, BLD GAS, ICA,H+H)
Acid-Base Excess: 1 mmol/L (ref 0.0–2.0)
Bicarbonate: 23.8 mmol/L (ref 20.0–28.0)
Calcium, Ion: 1.18 mmol/L (ref 1.15–1.40)
HCT: 34 % — ABNORMAL LOW (ref 39.0–52.0)
Hemoglobin: 11.6 g/dL — ABNORMAL LOW (ref 13.0–17.0)
O2 Saturation: 99 %
Potassium: 3.5 mmol/L (ref 3.5–5.1)
Sodium: 143 mmol/L (ref 135–145)
TCO2: 25 mmol/L (ref 22–32)
pCO2 arterial: 31.8 mmHg — ABNORMAL LOW (ref 32.0–48.0)
pH, Arterial: 7.481 — ABNORMAL HIGH (ref 7.350–7.450)
pO2, Arterial: 147 mmHg — ABNORMAL HIGH (ref 83.0–108.0)

## 2020-11-03 SURGERY — RIGHT/LEFT HEART CATH AND CORONARY ANGIOGRAPHY
Anesthesia: LOCAL

## 2020-11-03 MED ORDER — SODIUM CHLORIDE 0.9 % WEIGHT BASED INFUSION: 1.0000 mL/kg/h | INTRAVENOUS | Status: DC

## 2020-11-03 MED ORDER — SODIUM CHLORIDE 0.9 % IV SOLN: 250.0000 mL | INTRAVENOUS | Status: DC | PRN

## 2020-11-03 MED ORDER — SODIUM CHLORIDE 0.9% FLUSH: 3.0000 mL | INTRAVENOUS | Status: DC | PRN

## 2020-11-03 MED ORDER — NITROGLYCERIN 1 MG/10 ML FOR IR/CATH LAB
INTRA_ARTERIAL | Status: AC
  Filled 2020-11-03: qty 10

## 2020-11-03 MED ORDER — ASPIRIN 81 MG PO CHEW
81.0000 mg | CHEWABLE_TABLET | Freq: Every day | ORAL | Status: DC
  Administered 2020-11-03: 81 mg via ORAL
  Filled 2020-11-03: qty 1

## 2020-11-03 MED ORDER — VERAPAMIL HCL 2.5 MG/ML IV SOLN
INTRA_ARTERIAL | Status: DC | PRN
  Administered 2020-11-03: 6 mL via INTRA_ARTERIAL

## 2020-11-03 MED ORDER — SODIUM CHLORIDE 0.9 % IV SOLN: INTRAVENOUS | Status: DC

## 2020-11-03 MED ORDER — VERAPAMIL HCL 2.5 MG/ML IV SOLN
INTRA_ARTERIAL | Status: DC | PRN
  Administered 2020-11-03: 4 mL via INTRA_ARTERIAL

## 2020-11-03 MED ORDER — IOHEXOL 350 MG/ML SOLN
INTRAVENOUS | Status: DC | PRN
Start: 2020-11-03 — End: 2020-11-03
  Administered 2020-11-03: 70 mL via INTRA_ARTERIAL

## 2020-11-03 MED ORDER — MORPHINE SULFATE (PF) 2 MG/ML IV SOLN: 2.0000 mg | INTRAVENOUS | Status: DC | PRN

## 2020-11-03 MED ORDER — LIDOCAINE HCL (PF) 1 % IJ SOLN
INTRAMUSCULAR | Status: AC
  Filled 2020-11-03: qty 30

## 2020-11-03 MED ORDER — MIDAZOLAM HCL 2 MG/2ML IJ SOLN
INTRAMUSCULAR | Status: DC | PRN
  Administered 2020-11-03: 1 mg via INTRAVENOUS

## 2020-11-03 MED ORDER — HYDRALAZINE HCL 20 MG/ML IJ SOLN
10.0000 mg | INTRAMUSCULAR | Status: DC | PRN
  Administered 2020-11-03: 10 mg via INTRAVENOUS
  Filled 2020-11-03: qty 1

## 2020-11-03 MED ORDER — FENTANYL CITRATE (PF) 100 MCG/2ML IJ SOLN
INTRAMUSCULAR | Status: AC
  Filled 2020-11-03: qty 2

## 2020-11-03 MED ORDER — HEPARIN SODIUM (PORCINE) 1000 UNIT/ML IJ SOLN
INTRAMUSCULAR | Status: AC
  Filled 2020-11-03: qty 1

## 2020-11-03 MED ORDER — HEPARIN (PORCINE) IN NACL 1000-0.9 UT/500ML-% IV SOLN
INTRAVENOUS | Status: AC
  Filled 2020-11-03: qty 1000

## 2020-11-03 MED ORDER — SODIUM CHLORIDE 0.9% FLUSH: 3.0000 mL | Freq: Two times a day (BID) | INTRAVENOUS | Status: DC

## 2020-11-03 MED ORDER — ASPIRIN 81 MG PO CHEW: 81.0000 mg | CHEWABLE_TABLET | ORAL | Status: DC

## 2020-11-03 MED ORDER — LABETALOL HCL 5 MG/ML IV SOLN: 10.0000 mg | INTRAVENOUS | Status: DC | PRN

## 2020-11-03 MED ORDER — SODIUM CHLORIDE 0.9 % WEIGHT BASED INFUSION
3.0000 mL/kg/h | INTRAVENOUS | Status: AC
  Administered 2020-11-03: 3 mL/kg/h via INTRAVENOUS

## 2020-11-03 MED ORDER — ONDANSETRON HCL 4 MG/2ML IJ SOLN: 4.0000 mg | Freq: Four times a day (QID) | INTRAMUSCULAR | Status: DC | PRN

## 2020-11-03 MED ORDER — HEPARIN (PORCINE) IN NACL 1000-0.9 UT/500ML-% IV SOLN
INTRAVENOUS | Status: DC | PRN
  Administered 2020-11-03 (×2): 500 mL

## 2020-11-03 MED ORDER — ACETAMINOPHEN 325 MG PO TABS: 650.0000 mg | ORAL_TABLET | ORAL | Status: DC | PRN

## 2020-11-03 MED ORDER — MIDAZOLAM HCL 2 MG/2ML IJ SOLN
INTRAMUSCULAR | Status: AC
  Filled 2020-11-03: qty 2

## 2020-11-03 MED ORDER — VERAPAMIL HCL 2.5 MG/ML IV SOLN
INTRAVENOUS | Status: AC
  Filled 2020-11-03: qty 2

## 2020-11-03 MED ORDER — FENTANYL CITRATE (PF) 100 MCG/2ML IJ SOLN
INTRAMUSCULAR | Status: DC | PRN
  Administered 2020-11-03: 25 ug via INTRAVENOUS

## 2020-11-03 MED ORDER — HEPARIN SODIUM (PORCINE) 1000 UNIT/ML IJ SOLN
INTRAMUSCULAR | Status: DC | PRN
  Administered 2020-11-03: 5000 [IU] via INTRAVENOUS

## 2020-11-03 SURGICAL SUPPLY — 16 items
CATH INFINITI 5FR JL5 (CATHETERS) ×2 IMPLANT
CATH INFINITI JR4 5F (CATHETERS) ×2 IMPLANT
CATH OPTITORQUE TIG 4.0 5F (CATHETERS) ×2 IMPLANT
CATH SWAN GANZ 7F STRAIGHT (CATHETERS) ×2 IMPLANT
DEVICE RAD COMP TR BAND LRG (VASCULAR PRODUCTS) ×2 IMPLANT
GLIDESHEATH SLEND A-KIT 6F 22G (SHEATH) ×2 IMPLANT
GLIDESHEATH SLENDER 7FR .021G (SHEATH) ×2 IMPLANT
GUIDEWIRE INQWIRE 1.5J.035X260 (WIRE) ×1 IMPLANT
INQWIRE 1.5J .035X260CM (WIRE) ×2
KIT HEART LEFT (KITS) ×2 IMPLANT
PACK CARDIAC CATHETERIZATION (CUSTOM PROCEDURE TRAY) ×2 IMPLANT
TRANSDUCER W/STOPCOCK (MISCELLANEOUS) ×2 IMPLANT
TUBING CIL FLEX 10 FLL-RA (TUBING) ×2 IMPLANT
WIRE EMERALD 3MM-J .025X260CM (WIRE) ×2 IMPLANT
WIRE EMERALD ST .035X260CM (WIRE) ×2 IMPLANT
WIRE HI TORQ VERSACORE-J 145CM (WIRE) ×2 IMPLANT

## 2020-11-03 NOTE — Discharge Instructions (Signed)
Radial Site Care  This sheet gives you information about how to care for yourself after your procedure. Your health care provider may also give you more specific instructions. If you have problems or questions, contact your health care provider. What can I expect after the procedure? After the procedure, it is common to have:  Bruising and tenderness at the catheter insertion area. Follow these instructions at home: Medicines  Take over-the-counter and prescription medicines only as told by your health care provider. Insertion site care  Follow instructions from your health care provider about how to take care of your insertion site. Make sure you: ? Wash your hands with soap and water before you change your bandage (dressing). If soap and water are not available, use hand sanitizer. ? Change your dressing as told by your health care provider. ? Leave stitches (sutures), skin glue, or adhesive strips in place. These skin closures may need to stay in place for 2 weeks or longer. If adhesive strip edges start to loosen and curl up, you may trim the loose edges. Do not remove adhesive strips completely unless your health care provider tells you to do that.  Check your insertion site every day for signs of infection. Check for: ? Redness, swelling, or pain. ? Fluid or blood. ? Pus or a bad smell. ? Warmth.  Do not take baths, swim, or use a hot tub until your health care provider approves.  You may shower 24-48 hours after the procedure, or as directed by your health care provider. ? Remove the dressing and gently wash the site with plain soap and water. ? Pat the area dry with a clean towel. ? Do not rub the site. That could cause bleeding.  Do not apply powder or lotion to the site. Activity  For 24 hours after the procedure, or as directed by your health care provider: ? Do not flex or bend the affected arm. ? Do not push or pull heavy objects with the affected arm. ? Do not drive  yourself home from the hospital or clinic. You may drive 24 hours after the procedure unless your health care provider tells you not to. ? Do not operate machinery or power tools.  Do not lift anything that is heavier than 10 lb (4.5 kg), or the limit that you are told, until your health care provider says that it is safe.  Ask your health care provider when it is okay to: ? Return to work or school. ? Resume usual physical activities or sports. ? Resume sexual activity.   General instructions  If the catheter site starts to bleed, raise your arm and put firm pressure on the site. If the bleeding does not stop, get help right away. This is a medical emergency.  If you went home on the same day as your procedure, a responsible adult should be with you for the first 24 hours after you arrive home.  Keep all follow-up visits as told by your health care provider. This is important. Contact a health care provider if:  You have a fever.  You have redness, swelling, or yellow drainage around your insertion site. Get help right away if:  You have unusual pain at the radial site.  The catheter insertion area swells very fast.  The insertion area is bleeding, and the bleeding does not stop when you hold steady pressure on the area.  Your arm or hand becomes pale, cool, tingly, or numb. These symptoms may represent a serious   problem that is an emergency. Do not wait to see if the symptoms will go away. Get medical help right away. Call your local emergency services (911 in the [place] Do not drive yourself to the hospital. Summary  After the procedure, it is common to have bruising and tenderness at the site.  Follow instructions from your health care provider about how to take care of your radial site wound. Check the wound every day for signs of infection.  Do not lift anything that is heavier than 10 lb (4.5 kg), or the limit that you are told, until your health care provider says that it  is safe. This information is not intended to replace advice given to you by your health care provider. Make sure you discuss any questions you have with your health care provider. Document Revised: 07/10/2017 Document Reviewed: 07/10/2017 [employer] Patient Education  2021 [employer]  

## 2020-11-03 NOTE — Interval H&P Note (Signed)
Cath Lab Visit (complete for each Cath Lab visit)  Clinical Evaluation Leading to the Procedure:   ACS: No.  Non-ACS:    Anginal Classification: CCS II  Anti-ischemic medical therapy: Minimal Therapy (1 class of medications)  Non-Invasive Test Results: No non-invasive testing performed  Prior CABG: No previous CABG      History and Physical Interval Note:  11/03/2020 10:03 AM  Derek Washington  has presented today for surgery, with the diagnosis of aortic stenosis.  The various methods of treatment have been discussed with the patient and family. After consideration of risks, benefits and other options for treatment, the patient has consented to  Procedure(s): RIGHT/LEFT HEART CATH AND CORONARY ANGIOGRAPHY (N/A) as a surgical intervention.  The patient's history has been reviewed, patient examined, no change in status, stable for surgery.  I have reviewed the patient's chart and labs.  Questions were answered to the patient's satisfaction.     [name]

## 2020-11-03 NOTE — Progress Notes (Signed)
Discharge instruction given per MD order.  Pt and wife was able to verbalize understanding

## 2020-11-09 ENCOUNTER — Ambulatory Visit (HOSPITAL_COMMUNITY)
Admission: RE | Admit: 2020-11-09 | Discharge: 2020-11-09 | Disposition: A | Discharge status: Home / Self Care | Payer: Medicare Other | Source: Home / Self Care | Attending: Cardiovascular Disease | Admitting: Cardiovascular Disease

## 2020-11-09 ENCOUNTER — Other Ambulatory Visit: Payer: Self-pay

## 2020-11-09 ENCOUNTER — Ambulatory Visit (HOSPITAL_COMMUNITY)
Admission: RE | Admit: 2020-11-09 | Discharge: 2020-11-09 | Disposition: A | Discharge status: Home / Self Care | Payer: Medicare Other | Source: Ambulatory Visit | Attending: Cardiovascular Disease | Admitting: Cardiovascular Disease

## 2020-11-09 DIAGNOSIS — I350 Nonrheumatic aortic (valve) stenosis: Secondary | ICD-10-CM | POA: Insufficient documentation

## 2020-11-09 DIAGNOSIS — I2510 Atherosclerotic heart disease of native coronary artery without angina pectoris: Secondary | ICD-10-CM | POA: Diagnosis not present

## 2020-11-09 MED ORDER — IOHEXOL 350 MG/ML SOLN
100.0000 mL | Freq: Once | INTRAVENOUS | Status: AC | PRN
  Administered 2020-11-09: 100 mL via INTRAVENOUS

## 2020-11-09 NOTE — Progress Notes (Signed)
Carotid artery duplex completed. Refer to "CV Proc" under chart review to view preliminary results.  11/09/2020 9:18 AM Eula Fried., MHA, RVT, RDCS, RDMS

## 2020-11-16 ENCOUNTER — Institutional Professional Consult (permissible substitution): Payer: Medicare Other | Admitting: Surgery

## 2020-11-16 ENCOUNTER — Other Ambulatory Visit: Payer: Self-pay | Admitting: *Deleted

## 2020-11-16 ENCOUNTER — Encounter: Payer: Self-pay | Admitting: *Deleted

## 2020-11-16 ENCOUNTER — Other Ambulatory Visit: Payer: Self-pay

## 2020-11-16 VITALS — BP 143/71 | HR 58 | Resp 20 | Ht 72.0 in | Wt 232.0 lb

## 2020-11-16 DIAGNOSIS — I350 Nonrheumatic aortic (valve) stenosis: Secondary | ICD-10-CM

## 2020-11-16 DIAGNOSIS — R053 Chronic cough: Secondary | ICD-10-CM | POA: Insufficient documentation

## 2020-11-16 DIAGNOSIS — I10 Essential (primary) hypertension: Secondary | ICD-10-CM | POA: Insufficient documentation

## 2020-11-16 DIAGNOSIS — G4700 Insomnia, unspecified: Secondary | ICD-10-CM | POA: Insufficient documentation

## 2020-11-16 DIAGNOSIS — E785 Hyperlipidemia, unspecified: Secondary | ICD-10-CM | POA: Insufficient documentation

## 2020-11-16 DIAGNOSIS — M47817 Spondylosis without myelopathy or radiculopathy, lumbosacral region: Secondary | ICD-10-CM | POA: Insufficient documentation

## 2020-11-16 DIAGNOSIS — E559 Vitamin D deficiency, unspecified: Secondary | ICD-10-CM | POA: Insufficient documentation

## 2020-11-16 DIAGNOSIS — K219 Gastro-esophageal reflux disease without esophagitis: Secondary | ICD-10-CM | POA: Insufficient documentation

## 2020-11-16 DIAGNOSIS — J310 Chronic rhinitis: Secondary | ICD-10-CM | POA: Insufficient documentation

## 2020-11-16 DIAGNOSIS — E7800 Pure hypercholesterolemia, unspecified: Secondary | ICD-10-CM | POA: Insufficient documentation

## 2020-11-16 DIAGNOSIS — N400 Enlarged prostate without lower urinary tract symptoms: Secondary | ICD-10-CM | POA: Insufficient documentation

## 2020-11-18 ENCOUNTER — Encounter: Payer: Self-pay | Admitting: Surgery

## 2020-11-18 NOTE — Progress Notes (Signed)
Patient ID: Derek Washington, male   DOB: [birth year], 80 y.o.   MRN: [id number]  [facility]  MULTIDISCIPLINARY HEART VALVE CLINIC  CARDIOTHORACIC SURGERY CONSULTATION REPORT  Referring Provider is [name], MD Primary Cardiologist is Lance Muss, MD PCP is [name], MD  Chief Complaint  Patient presents with  . 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 Eldridge Dace.  Previous echocardiogram in July 2020 showed severe thickening of aortic valve with moderate calcification with trivial regurgitation.  The mean gradient across aortic valve was 33.7 mmHg with a peak gradient of 61.6 mmHg.  Stroke-volume index was 28.4.  Left ventricular ejection fraction was 55 to 60% with mild concentric LVH.  There is moderate dilatation of the ascending aorta measured at 45 mm on that echo.  He has had increasing exertional shortness of breath and fatigue as well as some chest pressure occasionally with walks.  He also reportedly had 2 episodes of falls where it is unclear if he had dizziness or loss consciousness.  A repeat echocardiogram on 10/27/2020 showed a bicuspid aortic valve with severe calcification and thickening of the leaflets.  There is mild regurgitation.  AI pressure half-time is 595 ms.  The mean gradient across aortic valve was 36 mmHg with a peak gradient of 55 mmHg.  The aortic root was measured at 3.5 cm and the ascending aorta measured at 4.4 cm.  He underwent cardiac catheterization on 11/03/2020 which showed a 50% stenosis and a PLA branch.  There is otherwise minimal coronary disease.  The patient is here today with his wife.  He is retired.  He is still very active and was going to the gym  daily until recently.  His son-in-law is Dr. Margo Aye. Past Medical History:  Diagnosis Date  . Bicuspid aortic valve    LAST ECHOCARDIOGRAM APPROXIMATELY 2016? MILD AORTIC REGURG  . BPH (benign prostatic hyperplasia)   . Hard of hearing   . Heart murmur   . High cholesterol   . Hydrocele   . Hypertension   . Insomnia   . Low back pain    RIGHT  . Mild obesity   . Nocturia   . Right elbow tendonitis   . Tinnitus     Past Surgical History:  Procedure Laterality Date  . HYDROCELE EXCISION  2017   DR. [name] 2017  . left total knee replacement      2007   . right knee total replacement      2011  . RIGHT/LEFT HEART CATH AND CORONARY ANGIOGRAPHY N/A 11/03/2020   Procedure: RIGHT/LEFT HEART CATH AND CORONARY ANGIOGRAPHY;  Surgeon: [name], MD;  Location: [facility];  Service: Cardiovascular;  Laterality: N/A;  . SCROTAL EXPLORATION N/A 12/26/2015   Procedure: SCROTUM EXPLORATION;  Surgeon: [name], MD;  Location: [facility];  Service: Urology;  Laterality: N/A;  EXCISION OF LEFT EPIDIDYMAL CYST/SPERMATOCELE  . VASECTOMY     1968    Family History  Problem Relation Age of Onset  . Other Mother 73       HEAVY SMOKER  . COPD Father 39       HEAVY SMOKER  . Other Sister 20  HEAVY SMOKER    Social History   Socioeconomic History  . Marital status: Married    Spouse name: Not on file  . Number of children: 1  . Years of education: Not on file  . Highest education level: Not on file  Occupational History  . Occupation: RETIRED  Tobacco Use  . Smoking status: Never Smoker  . Smokeless tobacco: Never Used  Vaping Use  . Vaping Use: Never used  Substance and Sexual Activity  . Alcohol use: Yes    Comment: occas   . Drug use: No  . Sexual activity: Not on file  Other Topics Concern  . Not on file  Social History Narrative  . Not on file   Social Determinants of Health   Financial Resource Strain: Not on file  Food Insecurity:  Not on file  Transportation Needs: Not on file  Physical Activity: Not on file  Stress: Not on file  Social Connections: Not on file  Intimate Partner Violence: Not on file    Current Outpatient Medications  Medication Sig Dispense Refill  . amLODipine (NORVASC) 10 MG tablet Take 5 mg by mouth every evening.    Marland Kitchen atorvastatin (LIPITOR) 20 MG tablet Take 20 mg by mouth every evening.    . Cholecalciferol (VITAMIN D3 PO) Take 1 tablet by mouth in the morning.    . finasteride (PROSCAR) 5 MG tablet Take 5 mg by mouth in the morning.    . Multiple Vitamin (MULTIVITAMIN WITH MINERALS) TABS tablet Take 1 tablet by mouth in the morning.    . olmesartan (BENICAR) 5 MG tablet Take 20 mg by mouth in the morning.    . Omega-3 Fatty Acids (FISH OIL) 1000 MG CAPS Take 1,000 mg by mouth 2 (two) times daily.     No current facility-administered medications for this visit.    Allergies  Allergen Reactions  . Hydrochlorothiazide Other (See Comments)    fatigue      Review of Systems:   General:  normal appetite, + decreasd energy, no weight gain, no weight loss, no fever  Cardiac:  no chest pain with exertion, no chest pain at rest, +SOB with moderate exertion, no resting SOB, no PND, no orthopnea, no palpitations, no arrhythmia, no atrial fibrillation, no LE edema, no dizzy spells, no syncope  Respiratory:  + exertional shortness of breath, no home oxygen, no productive cough, no dry cough, no bronchitis, no wheezing, no hemoptysis, no asthma, no pain with inspiration or cough, no sleep apnea, no CPAP at night  GI:   no difficulty swallowing, no reflux, no frequent heartburn, no hiatal hernia, no abdominal pain, no constipation, no diarrhea, no hematochezia, no hematemesis, no melena  GU:   no dysuria,  no frequency, no urinary tract infection, no hematuria, no enlarged prostate, no kidney stones, no kidney disease  Vascular:  no pain suggestive of claudication, no pain in feet, no leg cramps, no  varicose veins, no DVT, no non-healing foot ulcer  Neuro:   no stroke, no TIA's, no seizures, no headaches, no temporary blindness one eye,  no slurred speech, no peripheral neuropathy, no chronic pain, no instability of gait, no memory/cognitive dysfunction  Musculoskeletal: no arthritis, no joint swelling, no myalgias, no difficulty walking, normal mobility   Skin:   no rash, no itching, no skin infections, no pressure sores or ulcerations  Psych:   no anxiety, no depression, no nervousness, no unusual recent stress  Eyes:   no blurry vision, no floaters,  no recent vision changes, no glasses or contacts  ENT:   no hearing loss, no loose or painful teeth, no dentures, last saw dentist 08/2020  Hematologic:  no easy bruising, no abnormal bleeding, no clotting disorder, no frequent epistaxis  Endocrine:  no diabetes, does not check CBG's at home      Physical Exam:   BP (!) 143/71   Pulse (!) 58   Resp 20   Ht 6' (1.829 m)   Wt 232 lb (105.2 kg)   SpO2 97% Comment: RA  BMI 31.46 kg/m   General:  well-appearing and looks younger than stated age.  HEENT:  Unremarkable, NCAT, PERLA, EOMI  Neck:   no JVD, no bruits, no adenopathy   Chest:   clear to auscultation, symmetrical breath sounds, no wheezes, no rhonchi   CV:   RRR, grade lll/VI crescendo/decrescendo murmur heard best at RSB,  no diastolic murmur  Abdomen:  soft, non-tender, no masses   Extremities:  warm, well-perfused, pulses palpable at ankle, no LE edema  Rectal/GU  Deferred  Neuro:   Grossly non-focal and symmetrical throughout  Skin:   Clean and dry, no rashes, no breakdown   Diagnostic Tests:  ECHOCARDIOGRAM REPORT       Patient Name:  Derek OfficerMERLLYN Higbie Date of Exam: 10/27/2020  Medical Rec #: [id number]   Height:    72.0 in  Accession #:  [id number]   Weight:    239.4 lb  Date of Birth: [birth year]   BSA:     2.300 m  Patient Age:  79 years    BP:      140/66 mmHg  Patient Gender:  M       HR:      59 bpm.  Exam Location: Outpatient   Procedure: 2D Echo   Indications:  Aortic Stenosis I35.0    History:    Patient has prior history of Echocardiogram examinations,  most         recent 01/07/2019. Bicuspid AV; Risk Factors:Hypertension.    Sonographer:  Thurman Coyerasey Kirkpatrick RDCS (AE)  Referring Phys: [id number] JAYADEEP S VARANASI   IMPRESSIONS    1. Left ventricular ejection fraction, by estimation, is 55 to 60%. The  left ventricle has normal function. The left ventricle has no regional  wall motion abnormalities. There is mild concentric left ventricular  hypertrophy. Left ventricular diastolic  parameters are consistent with Grade I diastolic dysfunction (impaired  relaxation). Elevated left ventricular end-diastolic pressure.  2. Right ventricular systolic function is mildly reduced. The right  ventricular size is moderately enlarged. There is normal pulmonary artery  systolic pressure. The estimated right ventricular systolic pressure is  20.6 mmHg.  3. Left atrial size was severely dilated.  4. Right atrial size was mild to moderately dilated.  5. The mitral valve is normal in structure. Trivial mitral valve  regurgitation. No evidence of mitral stenosis.  6. The aortic valve is bicuspid. There is severe calcifcation of the  aortic valve. There is severe thickening of the aortic valve. Aortic valve  regurgitation is mild. Moderate aortic valve stenosis. Aortic  regurgitation PHT measures 595 msec. Aortic  valve mean gradient measures 36.0 mmHg. Aortic valve Vmax measures 3.71  m/s.  7. Aortic dilatation noted. There is moderate dilatation of the ascending  aorta, measuring 44 mm.  8. The inferior vena cava is normal in size with greater than 50%  respiratory variability, suggesting right atrial pressure of 3 mmHg.  9. There appears to be moderate AS.  The AVA is overestimated due to  overestimated LVOT diameter  measurement. The dimnesionless index was 0.32.   FINDINGS  Left Ventricle: Left ventricular ejection fraction, by estimation, is 55  to 60%. The left ventricle has normal function. The left ventricle has no  regional wall motion abnormalities. The left ventricular internal cavity  size was normal in size. There is  mild concentric left ventricular hypertrophy. Left ventricular diastolic  parameters are consistent with Grade I diastolic dysfunction (impaired  relaxation). Elevated left ventricular end-diastolic pressure.   Right Ventricle: The right ventricular size is moderately enlarged. No  increase in right ventricular wall thickness. Right ventricular systolic  function is mildly reduced. There is normal pulmonary artery systolic  pressure. The tricuspid regurgitant  velocity is 2.10 m/s, and with an assumed right atrial pressure of 3 mmHg,  the estimated right ventricular systolic pressure is 20.6 mmHg.   Left Atrium: Left atrial size was severely dilated.   Right Atrium: Right atrial size was mild to moderately dilated.   Pericardium: There is no evidence of pericardial effusion.   Mitral Valve: The mitral valve is normal in structure. Trivial mitral  valve regurgitation. No evidence of mitral valve stenosis.   Tricuspid Valve: The tricuspid valve is normal in structure. Tricuspid  valve regurgitation is mild . No evidence of tricuspid stenosis.   Aortic Valve: The aortic valve is bicuspid. There is severe calcifcation  of the aortic valve. There is severe thickening of the aortic valve.  Aortic valve regurgitation is mild. Aortic regurgitation PHT measures 595  msec. Moderate aortic stenosis is  present. Aortic valve mean gradient measures 36.0 mmHg. Aortic valve peak  gradient measures 55.1 mmHg. Aortic valve area, by VTI measures 1.70 cm.   Pulmonic Valve: The pulmonic valve was normal in structure. Pulmonic valve  regurgitation is trivial. No evidence of pulmonic  stenosis.   Aorta: Aortic dilatation noted. There is moderate dilatation of the  ascending aorta, measuring 44 mm.   Venous: The inferior vena cava is normal in size with greater than 50%  respiratory variability, suggesting right atrial pressure of 3 mmHg.   IAS/Shunts: No atrial level shunt detected by color flow Doppler.     LEFT VENTRICLE  PLAX 2D  LVIDd:     5.20 cm Diastology  LVIDs:     3.60 cm LV e' medial:  5.11 cm/s  LV PW:     1.20 cm LV E/e' medial: 12.6  LV IVS:    1.30 cm LV e' lateral:  4.57 cm/s  LVOT diam:   2.60 cm LV E/e' lateral: 14.1  LV SV:     164  LV SV Index:  71  LVOT Area:   5.31 cm     RIGHT VENTRICLE  RV S prime:   8.81 cm/s  TAPSE (M-mode): 2.6 cm   LEFT ATRIUM       Index    RIGHT ATRIUM      Index  LA diam:    4.40 cm 1.91 cm/m RA Area:   25.10 cm  LA Vol (A2C):  148.0 ml 64.35 ml/m RA Volume:  84.80 ml 36.87 ml/m  LA Vol (A4C):  85.0 ml 36.96 ml/m  LA Biplane Vol: 122.0 ml 53.05 ml/m  AORTIC VALVE  AV Area (Vmax):  1.65 cm  AV Area (Vmean):  1.55 cm  AV Area (VTI):   1.70 cm  AV Vmax:      371.00 cm/s  AV Vmean:     288.000 cm/s  AV VTI:      0.962 m  AV Peak Grad:   55.1 mmHg  AV Mean Grad:   36.0 mmHg  LVOT Vmax:     115.00 cm/s  LVOT Vmean:    84.300 cm/s  LVOT VTI:     0.308 m  LVOT/AV VTI ratio: 0.32  AI PHT:      595 msec    AORTA  Ao Root diam: 3.50 cm   MITRAL VALVE        TRICUSPID VALVE  MV Area (PHT): 2.42 cm   TR Peak grad:  17.6 mmHg  MV Decel Time: 313 msec   TR Vmax:    210.00 cm/s  MV E velocity: 64.30 cm/s  MV A velocity: 102.00 cm/s SHUNTS  MV E/A ratio: 0.63     Systemic VTI: 0.31 m               Systemic Diam: 2.60 cm   Armanda Magic MD  Electronically signed by Armanda Magic MD  Signature Date/Time: 10/27/2020/9:27:54 PM      Final     Physicians  Panel Physicians Referring Physician Case Authorizing Physician  [name], MD (Primary)      Procedures  RIGHT/LEFT HEART CATH AND CORONARY ANGIOGRAPHY   Conclusion    RPAV lesion is 50% stenosed.  Hemodynamic findings consistent with aortic valve stenosis.   Maisen Klingler is a 80 y.o. male    [id number] LOCATION:  FACILITY: [facility]  PHYSICIAN: [name], M.D. 1941-03-04   DATE OF PROCEDURE:  11/03/2020  DATE OF DISCHARGE:     CARDIAC CATHETERIZATION     History obtained from chart review.  Mr. Hovater is a 80 year old married Caucasian male patient of Dr. Hoyle Barr with bicuspid aortic valve, aortic stenosis and regurgitation with a thoracic aortic aneurysm who is referred for right left heart cath because of aggressive dyspnea.  He does have a history of hypertension and hyperlipidemia.   IMPRESSION: Mr. Nix has minimal CAD with at most a 50% lesion in his PLA branch.  I suspect his symptoms are related to his bicuspid aortic valve stenosis/regurgitation which will be addressed by Dr. Eldridge Dace as an outpatient.  The sheath was removed and a TR band was placed on the right wrist to achieve patent hemostasis.  The patient left lab in stable condition.  [name]. MD, [facility] 11/03/2020 11:15 AM       Recommendations  Antiplatelet/Anticoag Recommend Aspirin  daily for moderate CAD.   Indications  Nonrheumatic aortic valve stenosis [I35.0 (ICD-10-CM)]   Procedural Details  Technical Details PROCEDURE DESCRIPTION:   The patient was brought to the second floor [facility] cath lab in the postabsorptive state. He was premedicated with IV Versed and fentanyl. His right wrist and antecubital fossa Were prepped and shaved in usual sterile fashion. Xylocaine 1% was used for local anesthesia. A 6 French sheath was inserted into the right radial artery using standard Seldinger technique.  A 7 French  sheath was inserted into the right antecubital vein.  A 7 [place] balloontipped demolition Swan-Ganz catheter was advanced through the right heart chambers obtain sequential pressures and pulmonary artery blood samples for the determination of Fick cardiac output.  5 [place] TIG catheter and Judkins catheters were used for selective coronary angiography.  I attempted to cross the aortic valve with a right Judkins catheter and straight wire unsuccessfully.  Isovue dye was used for the entirety of the case (70 cc to patient).  Retrograde aortic pressures  monitored during the case.  Radial cocktail was administered via the SideArm sheath.  The patient received 5000 units  of heparin intravenously.   There was no blood loss for this procedure.   During this procedure medications were administered to achieve and maintain moderate conscious sedation while the patient's heart rate, blood pressure, and oxygen saturation were continuously monitored and I was present face-to-face 100% of this time.   Medications (Filter: Administrations occurring from [phone] to 1100 on 11/03/20) (important) Continuous medications are totaled by the amount administered until 11/03/20 1100.    midazolam (VERSED) injection (mg) Total dose:  1 mg  Date/Time Rate/Dose/Volume Action   11/03/20 1008 1 mg Given    fentaNYL (SUBLIMAZE) injection (mcg) Total dose:  25 mcg  Date/Time Rate/Dose/Volume Action   11/03/20 1008 25 mcg Given    Heparin (Porcine) in NaCl 1000-0.9 UT/500ML-% SOLN (mL) Total volume:  1,000 mL  Date/Time Rate/Dose/Volume Action   11/03/20 1008 500 mL Given   1009 500 mL Given    Radial Cocktail (Verapamil 2.5 mg, NTG, Lidocaine) (mL) Total volume:  6 mL  Date/Time Rate/Dose/Volume Action   11/03/20 1033 6 mL Given    heparin sodium (porcine) injection (Units) Total dose:  5,000 Units  Date/Time Rate/Dose/Volume Action   11/03/20 1034 5,000 Units Given    Radial Cocktail (Verapamil 2.5 mg,  NTG, Lidocaine) (mL) Total volume:  4 mL  Date/Time Rate/Dose/Volume Action   11/03/20 1046 4 mL Given    iohexol (OMNIPAQUE) 350 MG/ML injection (mL) Total volume:  70 mL  Date/Time Rate/Dose/Volume Action   11/03/20 1057 70 mL Given    Sedation Time  Sedation Time Physician-1: 46 minutes 29 seconds   Contrast  Medication Name Total Dose  iohexol (OMNIPAQUE) 350 MG/ML injection 70 mL    Radiation/Fluoro  Fluoro time: 11.6 (min) DAP: 47.7 (Gycm2) Cumulative Air Kerma: 704.9 (mGy)   Coronary Findings   Diagnostic Dominance: Right  Right Coronary Artery  Right Posterior Atrioventricular Artery  RPAV lesion is 50% stenosed.   Intervention   No interventions have been documented.  Right Heart  Right Heart Pressures Hemodynamic findings consistent with aortic valve stenosis. Right atrial pressure-7/5 Right ventricular pressure-27/0 Pulmonary artery pressure- 27/10, mean 15 Pulmonary H pressure- A-wave 16, V wave 13, mean 12 Cardiac output by Fick was 7 L/min with an index of 3.1 L/min/m   Coronary Diagrams   Diagnostic Dominance: Right    Intervention    Implants    No implant documentation for this case.   Syngo Images  Show images for CARDIAC CATHETERIZATION  Images on Long Term Storage  Show images for Nickalos, Petersen "[name]"  Link to Procedure Log  Procedure Log     Hemo Data  Flowsheet Row Most Recent Value  Fick Cardiac Output 7.09 L/min  Fick Cardiac Output Index 3.13 (L/min)/BSA  RA A Wave 7 mmHg  RA V Wave 5 mmHg  RA Mean 3 mmHg  RV Systolic Pressure 27 mmHg  RV Diastolic Pressure 0 mmHg  RV EDP 6 mmHg  PA Systolic Pressure 27 mmHg  PA Diastolic Pressure 10 mmHg  PA Mean 15 mmHg  PW A Wave 16 mmHg  PW V Wave 13 mmHg  PW Mean 12 mmHg  AO Systolic Pressure 160 mmHg  AO Diastolic Pressure 65 mmHg  AO Mean 99 mmHg  QP/QS 1  TPVR Index 4.8 HRUI  TSVR Index 31.65 HRUI  PVR SVR Ratio 0.03  TPVR/TSVR Ratio 0.15     ADDENDUM  REPORT: 11/09/2020 14:15  EXAM: OVER-READ INTERPRETATION  CT CHEST  The following report is an over-read performed by radiologist Dr. Royal Piedra [facility] on 11/09/2020. This over-read does not include interpretation of cardiac or coronary anatomy or pathology. The coronary artery calcium score and cardiac CTA interpretation by the cardiologist is attached.  COMPARISON:  None.  FINDINGS: Extracardiac findings are described separately under dictation for contemporaneously obtained CTA chest, abdomen and pelvis.  IMPRESSION: Please see separate dictation for contemporaneously obtained CTA chest, abdomen and pelvis dated 11/09/2020 for full description of relevant extracardiac findings.   Electronically Signed   By: Trudie Reed M.D.   On: 11/09/2020 14:15   Addended by Florencia Reasons, MD on 11/09/2020 2:17 PM    Study Result  Narrative & Impression  CLINICAL DATA:  Aortic Stenosis  EXAM: Cardiac TAVR CT  TECHNIQUE: The patient was scanned on a Siemens Force 192 slice scanner. A 120 kV retrospective scan was triggered in the ascending thoracic aorta at 140 HU's. Gantry rotation speed was 250 msecs and collimation was .6 mm. No beta blockade or nitro were given. The 3D data set was reconstructed in 5% intervals of the R-R cycle. Systolic and diastolic phases were analyzed on a dedicated work station using MPR, MIP and VRT modes. The patient received 80 cc of contrast.  FINDINGS: Aortic Valve: Bicuspid Sievers type one with raphe between right and left cusps Heavily calcified score 5345  Aorta: Mild diffuse dilatation with moderate calcific atherosclerosis  Sino-tubular Junction: 31 mm  Ascending Thoracic Aorta: 42 mm  Descending Thoracic Aorta: 29 mm  Sinus of Valsalva Measurements:  Left Sinus: 35.2 mm  Right sinus 32.7 mm  Non Sinus: 37.1 mm  Coronary Artery Height above Annulus:  Left  Main: 12.95 mm above annulus  Right Coronary: 15.3 mm above annulus  Virtual Basal Annulus Measurements:  Maximum / Minimum Diameter: 29.2 mm x 27.2 mm  Perimeter: 93 mm  Area: 641 mm2  Coronary Arteries: Sufficient height above annulus for deployment  Optimum Fluoroscopic Angle for Delivery: LAO 7 Caudal 14 degrees  IMPRESSION: 1. Bicuspid AV Sievers 1 fused right and left cusp with calcium score 5345  2.  Annular area of 641 mm2 suitable for a 29 mm Sapien 3 valve  3.  Dilated aortic root 4.2 cm  4. Areas of calcification in the aortic annulus and STJ in setting of bicuspid valve increases risk of PVL  5.  Coronary arteries sufficient height above annulus for deployment  Charlton Haws  Electronically Signed: By: Charlton Haws M.D. On: 11/09/2020 11:20     Impression:  This 80 year old gentleman has a bicuspid aortic valve with fusion of the left and right cusp and very heavy calcification of the valve with a calcium score of 5345.  There is significant calcification in the aortic annulus.  With a heavily calcified bicuspid aortic valve I think the best option for treatment is open surgical aortic valve replacement to prevent the likely complication of paravalvular leak.  He has mild dilatation of the ascending aorta but it is still below the threshold where I would replace his aorta especially in a 80 year old patient.  I discussed the alternative of transcatheter aortic valve replacement and my concerns about doing that in the setting of a heavily calcified bicuspid aortic valve.  He understands and is in agreement with proceeding with open surgery using a bioprosthetic aortic valve. I discussed the operative procedure with the patient and his wife including alternatives,  benefits and risks; including but not limited to bleeding, blood transfusion, infection, stroke, myocardial infarction, graft failure, heart block requiring a permanent pacemaker, organ  dysfunction, and death.  Derek Washington understands and agrees to proceed.    Plan:  He will be scheduled for open surgical aortic valve replacement on Thursday, 12/01/2020.  I spent 60 minutes performing this consultation and > 50% of this time was spent face to face counseling and coordinating the care of this patient's severe bicuspid aortic valve stenosis     [name], MD 11/16/2020

## 2020-11-28 NOTE — Pre-Procedure Instructions (Signed)
Surgical Instructions    Your procedure is scheduled on Thursday, June 16th.  Report to [facility] Main Entrance "A" at 5:30 A.M., then check in with the Admitting office.  Call this number if you have problems the morning of surgery:  [phone]   If you have any questions prior to your surgery date call [phone]: Open Monday-Friday 8am-4pm    Remember:  Do not eat or drink after midnight the night before your surgery    Take these medicines the morning of surgery with A SIP OF WATER  finasteride (PROSCAR)   As of today, STOP taking any Aspirin (unless otherwise instructed by your surgeon) Aleve, Naproxen, Ibuprofen, Motrin, Advil, Goody's, BC's, all herbal medications, fish oil, and all vitamins.                     Do NOT Smoke (Tobacco/Vaping) or drink Alcohol 24 hours prior to your procedure.  If you use a CPAP at night, you may bring all equipment for your overnight stay.   Contacts, glasses, piercing's, hearing aid's, dentures or partials may not be worn into surgery, please bring cases for these belongings.    For patients admitted to the hospital, discharge time will be determined by your treatment team.   Patients discharged the day of surgery will not be allowed to drive home, and someone needs to stay with them for 24 hours.    Special instructions:   [facility]- Preparing For Surgery  Before surgery, you can play an important role. Because skin is not sterile, your skin needs to be as free of germs as possible. You can reduce the number of germs on your skin by washing with CHG (chlorahexidine gluconate) Soap before surgery.  CHG is an antiseptic cleaner which kills germs and bonds with the skin to continue killing germs even after washing.    Oral Hygiene is also important to reduce your risk of infection.  Remember - BRUSH YOUR TEETH THE MORNING OF SURGERY WITH YOUR REGULAR TOOTHPASTE  Please do not use if you have an allergy to CHG or antibacterial soaps.  If your skin becomes reddened/irritated stop using the CHG.  Do not shave (including legs and underarms) for at least 48 hours prior to first CHG shower. It is OK to shave your face.  Please follow these instructions carefully.   Shower the NIGHT BEFORE SURGERY and the MORNING OF SURGERY  If you chose to wash your hair, wash your hair first as usual with your normal shampoo.  After you shampoo, rinse your hair and body thoroughly to remove the shampoo.  Use CHG Soap as you would any other liquid soap. You can apply CHG directly to the skin and wash gently with a scrungie or a clean washcloth.   Apply the CHG Soap to your body ONLY FROM THE NECK DOWN.  Do not use on open wounds or open sores. Avoid contact with your eyes, ears, mouth and genitals (private parts). Wash Face and genitals (private parts)  with your normal soap.   Wash thoroughly, paying special attention to the area where your surgery will be performed.  Thoroughly rinse your body with warm water from the neck down.  DO NOT shower/wash with your normal soap after using and rinsing off the CHG Soap.  Pat yourself dry with a CLEAN TOWEL.  Wear CLEAN PAJAMAS to bed the night before surgery  Place CLEAN SHEETS on your bed the night before your surgery  DO NOT SLEEP  WITH PETS.   Day of Surgery: Shower with CHG soap. Do not wear jewelry. Do not wear lotions, powders, colognes, or deodorant. Men may shave face and neck. Do not bring valuables to the hospital. [facility] is not responsible for any belongings or valuables. Wear Clean/Comfortable clothing the morning of surgery Remember to brush your teeth WITH YOUR REGULAR TOOTHPASTE.   Please read over the following fact sheets that you were given.

## 2020-11-29 ENCOUNTER — Other Ambulatory Visit: Payer: Self-pay

## 2020-11-29 ENCOUNTER — Ambulatory Visit (HOSPITAL_COMMUNITY)
Admission: RE | Admit: 2020-11-29 | Discharge: 2020-11-29 | Disposition: A | Discharge status: Home / Self Care | Payer: Medicare Other | Source: Ambulatory Visit | Attending: Surgery | Admitting: Surgery

## 2020-11-29 ENCOUNTER — Ambulatory Visit (HOSPITAL_BASED_OUTPATIENT_CLINIC_OR_DEPARTMENT_OTHER)
Admission: RE | Admit: 2020-11-29 | Discharge: 2020-11-29 | Disposition: A | Discharge status: Home / Self Care | Payer: Medicare Other | Source: Ambulatory Visit | Attending: Surgery | Admitting: Surgery

## 2020-11-29 ENCOUNTER — Encounter (HOSPITAL_COMMUNITY): Payer: Self-pay

## 2020-11-29 ENCOUNTER — Encounter (HOSPITAL_COMMUNITY)
Admission: RE | Admit: 2020-11-29 | Discharge: 2020-11-29 | Disposition: A | Discharge status: Home / Self Care | Payer: Medicare Other | Source: Ambulatory Visit | Attending: Surgery | Admitting: Surgery

## 2020-11-29 DIAGNOSIS — Z20822 Contact with and (suspected) exposure to covid-19: Secondary | ICD-10-CM | POA: Insufficient documentation

## 2020-11-29 DIAGNOSIS — I350 Nonrheumatic aortic (valve) stenosis: Secondary | ICD-10-CM

## 2020-11-29 DIAGNOSIS — Z01818 Encounter for other preprocedural examination: Secondary | ICD-10-CM

## 2020-11-29 LAB — URINALYSIS, ROUTINE W REFLEX MICROSCOPIC
Bacteria, UA: NONE SEEN
Bilirubin Urine: NEGATIVE
Glucose, UA: NEGATIVE mg/dL
Hgb urine dipstick: NEGATIVE
Ketones, ur: NEGATIVE mg/dL
Leukocytes,Ua: NEGATIVE
Nitrite: NEGATIVE
Protein, ur: 100 mg/dL — AB
Specific Gravity, Urine: 1.017 (ref 1.005–1.030)
pH: 5 (ref 5.0–8.0)

## 2020-11-29 LAB — PROTIME-INR
INR: 1.1 (ref 0.8–1.2)
Prothrombin Time: 13.7 seconds (ref 11.4–15.2)

## 2020-11-29 LAB — CBC
HCT: 42.2 % (ref 39.0–52.0)
Hemoglobin: 14.3 g/dL (ref 13.0–17.0)
MCH: 32.2 pg (ref 26.0–34.0)
MCHC: 33.9 g/dL (ref 30.0–36.0)
MCV: 95 fL (ref 80.0–100.0)
Platelets: 198 10*3/uL (ref 150–400)
RBC: 4.44 MIL/uL (ref 4.22–5.81)
RDW: 13.4 % (ref 11.5–15.5)
WBC: 7.2 10*3/uL (ref 4.0–10.5)
nRBC: 0 % (ref 0.0–0.2)

## 2020-11-29 LAB — COMPREHENSIVE METABOLIC PANEL
ALT: 23 U/L (ref 0–44)
AST: 23 U/L (ref 15–41)
Albumin: 4.1 g/dL (ref 3.5–5.0)
Alkaline Phosphatase: 55 U/L (ref 38–126)
Anion gap: 10 (ref 5–15)
BUN: 20 mg/dL (ref 8–23)
CO2: 20 mmol/L — ABNORMAL LOW (ref 22–32)
Calcium: 9.5 mg/dL (ref 8.9–10.3)
Chloride: 108 mmol/L (ref 98–111)
Creatinine, Ser: 1.03 mg/dL (ref 0.61–1.24)
GFR, Estimated: >60 mL/min (ref >60)
Glucose, Bld: 93 mg/dL (ref 70–99)
Potassium: 3.8 mmol/L (ref 3.5–5.1)
Sodium: 138 mmol/L (ref 135–145)
Total Bilirubin: 1.3 mg/dL — ABNORMAL HIGH (ref 0.3–1.2)
Total Protein: 6.7 g/dL (ref 6.5–8.1)

## 2020-11-29 LAB — BLOOD GAS, ARTERIAL
Acid-base deficit: 2.2 mmol/L — ABNORMAL HIGH (ref 0.0–2.0)
Bicarbonate: 21.9 mmol/L (ref 20.0–28.0)
Drawn by: 602861
FIO2: 21
O2 Saturation: 98.1 %
Patient temperature: 37
pCO2 arterial: 36.8 mmHg (ref 32.0–48.0)
pH, Arterial: 7.393 (ref 7.350–7.450)
pO2, Arterial: 105 mmHg (ref 83.0–108.0)

## 2020-11-29 LAB — SURGICAL PCR SCREEN
MRSA, PCR: NEGATIVE
Staphylococcus aureus: POSITIVE — AB

## 2020-11-29 LAB — APTT: aPTT: 26 seconds (ref 24–36)

## 2020-11-29 LAB — SARS CORONAVIRUS 2 (TAT 6-24 HRS): SARS Coronavirus 2: NEGATIVE

## 2020-11-29 NOTE — Progress Notes (Signed)
PCP - Dr. [name] Cardiologist -   PPM/ICD - denies Device Orders -  Rep Notified -   Chest x-ray - 11/29/20 EKG - 10/31/20 Stress Test - none ECHO - 10/27/20 Cardiac Cath - 11/03/20  Sleep Study - denies CPAP - no  Fasting Blood Sugar - n/a Checks Blood Sugar _____ times a day  Blood Thinner Instructions:n/a Aspirin Instructions:n/a  ERAS Protcol -no PRE-SURGERY Ensure or G2-   COVID TEST- 11/29/20   Anesthesia review: yes  Patient denies shortness of breath, fever, cough and chest pain at PAT appointment   All instructions explained to the patient, with a verbal understanding of the material. Patient agrees to go over the instructions while at home for a better understanding. Patient also instructed to self quarantine after being tested for COVID-19. The opportunity to ask questions was provided.

## 2020-11-29 NOTE — Progress Notes (Signed)
Pre cabg has been completed.   Preliminary results in CV Proc.   Blanch Media 11/29/2020 9:05 AM

## 2020-11-30 LAB — HEMOGLOBIN A1C
Hgb A1c MFr Bld: 5.5 % (ref 4.8–5.6)
Mean Plasma Glucose: 111 mg/dL

## 2020-11-30 MED ORDER — TRANEXAMIC ACID (OHS) BOLUS VIA INFUSION
15.0000 mg/kg | INTRAVENOUS | Status: AC
  Administered 2020-12-01: 1597.5 mg via INTRAVENOUS
  Filled 2020-11-30: qty 1598

## 2020-11-30 MED ORDER — TRANEXAMIC ACID 1000 MG/10ML IV SOLN
1.5000 mg/kg/h | INTRAVENOUS | Status: AC
  Administered 2020-12-01: 1.5 mg/kg/h via INTRAVENOUS
  Filled 2020-11-30: qty 25

## 2020-11-30 MED ORDER — PLASMA-LYTE A IV SOLN
INTRAVENOUS | Status: DC
  Filled 2020-11-30: qty 5

## 2020-11-30 MED ORDER — DEXMEDETOMIDINE HCL IN NACL 400 MCG/100ML IV SOLN
0.1000 ug/kg/h | INTRAVENOUS | Status: AC
  Administered 2020-12-01: .3 ug/kg/h via INTRAVENOUS
  Filled 2020-11-30: qty 100

## 2020-11-30 MED ORDER — PHENYLEPHRINE HCL-NACL 20-0.9 MG/250ML-% IV SOLN
30.0000 ug/min | INTRAVENOUS | Status: AC
  Administered 2020-12-01: 10 ug/min via INTRAVENOUS
  Filled 2020-11-30: qty 250

## 2020-11-30 MED ORDER — SODIUM CHLORIDE 0.9 % IV SOLN
INTRAVENOUS | Status: DC
  Filled 2020-11-30: qty 30

## 2020-11-30 MED ORDER — POTASSIUM CHLORIDE 2 MEQ/ML IV SOLN
80.0000 meq | INTRAVENOUS | Status: DC
  Filled 2020-11-30: qty 40

## 2020-11-30 MED ORDER — NOREPINEPHRINE 4 MG/250ML-% IV SOLN
0.0000 ug/min | INTRAVENOUS | Status: DC
Start: 2020-12-01 — End: 2020-12-01
  Filled 2020-11-30: qty 250

## 2020-11-30 MED ORDER — MILRINONE LACTATE IN DEXTROSE 20-5 MG/100ML-% IV SOLN
0.3000 ug/kg/min | INTRAVENOUS | Status: DC
  Filled 2020-11-30: qty 100

## 2020-11-30 MED ORDER — NITROGLYCERIN IN D5W 200-5 MCG/ML-% IV SOLN
2.0000 ug/min | INTRAVENOUS | Status: AC
Start: 2020-12-01 — End: 2020-12-01
  Administered 2020-12-01: 10 ug/min via INTRAVENOUS
  Filled 2020-11-30: qty 250

## 2020-11-30 MED ORDER — TRANEXAMIC ACID (OHS) PUMP PRIME SOLUTION
2.0000 mg/kg | INTRAVENOUS | Status: DC
  Filled 2020-11-30: qty 2.13

## 2020-11-30 MED ORDER — CEFAZOLIN SODIUM-DEXTROSE 2-4 GM/100ML-% IV SOLN
2.0000 g | INTRAVENOUS | Status: AC
  Administered 2020-12-01: 2 g via INTRAVENOUS
  Filled 2020-11-30: qty 100

## 2020-11-30 MED ORDER — EPINEPHRINE HCL 5 MG/250ML IV SOLN IN NS
0.0000 ug/min | INTRAVENOUS | Status: DC
  Filled 2020-11-30: qty 250

## 2020-11-30 MED ORDER — INSULIN REGULAR(HUMAN) IN NACL 100-0.9 UT/100ML-% IV SOLN
INTRAVENOUS | Status: AC
  Administered 2020-12-01: .9 [IU]/h via INTRAVENOUS
  Filled 2020-11-30: qty 100

## 2020-11-30 MED ORDER — VANCOMYCIN HCL 1500 MG/300ML IV SOLN
1500.0000 mg | INTRAVENOUS | Status: AC
  Administered 2020-12-01: 1500 mg via INTRAVENOUS
  Filled 2020-11-30: qty 300

## 2020-11-30 MED ORDER — CEFAZOLIN SODIUM-DEXTROSE 2-4 GM/100ML-% IV SOLN
2.0000 g | INTRAVENOUS | Status: AC
  Administered 2020-12-01: 2 g via INTRAVENOUS
  Filled 2020-11-30 (×2): qty 100

## 2020-11-30 MED ORDER — MAGNESIUM SULFATE 50 % IJ SOLN
40.0000 meq | INTRAMUSCULAR | Status: DC
  Filled 2020-11-30: qty 9.85

## 2020-11-30 NOTE — Anesthesia Preprocedure Evaluation (Addendum)
Anesthesia Evaluation  Patient identified by MRN, date of birth, ID band Patient awake    Reviewed: Allergy & Precautions, NPO status , Patient's Chart, lab work & pertinent test results  Airway Mallampati: II  TM Distance: >3 FB Neck ROM: Full    Dental  (+) Teeth Intact   Pulmonary  11/29/2020 SARS coronavirus NEG   Pulmonary exam normal        Cardiovascular hypertension, Pt. on medications + CAD (minimal ASCAD)  + dysrhythmias + Valvular Problems/Murmurs (bicuspid aortic valve) AS  Rhythm:Regular Rate:Normal + Systolic murmurs 02/4853 ECHO: EF 55 to 60%. The LV has normal function, no regional wall motion abnormalities. There is mild concentric LVH. Mildly decreased RV function. Grade I DD, biatrial dilation, bicuspid aortic valve, severe calcifcation and of the aortic valve. Mild AI, mod AS with  Aortic regurgitation PHT 595 msec. AV mean gradient  36.0 mmHg. Aortic valve Vmax measures 3.71 m/s.  Moderate dilatation of the ascending aorta, measuring 44 mm. Mild TR   Neuro/Psych    GI/Hepatic GERD  ,  Endo/Other    Renal/GU      Musculoskeletal   Abdominal (+)  Abdomen: soft.    Peds  Hematology   Anesthesia Other Findings   Reproductive/Obstetrics                            Anesthesia Physical Anesthesia Plan  ASA: 4  Anesthesia Plan: General   Post-op Pain Management:    Induction: Intravenous  PONV Risk Score and Plan: 2 and Treatment may vary due to age or medical condition  Airway Management Planned: Oral ETT  Additional Equipment: Arterial line, PA Cath, TEE and Ultrasound Guidance Line Placement  Intra-op Plan:   Post-operative Plan: Post-operative intubation/ventilation  Informed Consent: I have reviewed the patients History and Physical, chart, labs and discussed the procedure including the risks, benefits and alternatives for the proposed anesthesia with the  patient or authorized representative who has indicated his/her understanding and acceptance.     Dental advisory given  Plan Discussed with: CRNA  Anesthesia Plan Comments: (Lab Results      Component                Value               Date                      WBC                      7.2                 11/29/2020                HGB                      14.3                11/29/2020                HCT                      42.2                11/29/2020                MCV  95.0                11/29/2020                PLT                      198                 11/29/2020           Lab Results      Component                Value               Date                      NA                       138                 11/29/2020                K                        3.8                 11/29/2020                CO2                      20 (L)              11/29/2020                GLUCOSE                  93                  11/29/2020                BUN                      20                  11/29/2020                CREATININE               1.03                11/29/2020                CALCIUM                  9.5                 11/29/2020                GFRNONAA                 >60                 11/29/2020                GFRAA                    88  01/14/2020          )       Anesthesia Quick Evaluation

## 2020-11-30 NOTE — H&P (Signed)
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 Eldridge Dace.  Previous echocardiogram in July 2020 showed severe thickening of aortic valve with moderate calcification with trivial regurgitation.  The mean gradient across aortic valve was 33.7 mmHg with a peak gradient of 61.6 mmHg.  Stroke-volume index was 28.4.  Left ventricular ejection fraction was 55 to 60% with mild concentric LVH.  There is moderate dilatation of the ascending aorta measured at 45 mm on that echo.  He has had increasing exertional shortness of breath and fatigue as well as some chest pressure occasionally with walks.  He also reportedly had 2 episodes of falls where it is unclear if he had dizziness or loss consciousness.  A repeat echocardiogram on 10/27/2020 showed a bicuspid aortic valve with severe calcification and thickening of the leaflets.  There is mild regurgitation.  AI pressure half-time is 595 ms.  The mean gradient across aortic valve was 36 mmHg with a peak gradient of 55 mmHg.  The aortic root was measured at 3.5 cm and the ascending aorta measured at 4.4 cm.  He underwent cardiac catheterization on 11/03/2020 which showed a 50% stenosis and a PLA branch.  There is otherwise minimal coronary disease.   He lives with his wife.  He is retired.  He is still very active and was going to the gym daily until recently.  His son-in-law is Dr. Margo Aye.     Past Medical History:  Diagnosis Date   Bicuspid aortic valve      LAST  ECHOCARDIOGRAM APPROXIMATELY 2016? MILD AORTIC REGURG   BPH (benign prostatic hyperplasia)     Hard of hearing     Heart murmur     High cholesterol     Hydrocele     Hypertension     Insomnia     Low back pain      RIGHT   Mild obesity     Nocturia     Right elbow tendonitis     Tinnitus             Past Surgical History:  Procedure Laterality Date   HYDROCELE EXCISION   2017    DR. [name] 2017   left total knee replacement        2007   right knee total replacement        2011   RIGHT/LEFT HEART CATH AND CORONARY ANGIOGRAPHY N/A 11/03/2020    Procedure: RIGHT/LEFT HEART CATH AND CORONARY ANGIOGRAPHY;  Surgeon: [name], MD;  Location: [facility];  Service: Cardiovascular;  Laterality: N/A;   SCROTAL EXPLORATION N/A 12/26/2015    Procedure: SCROTUM EXPLORATION;  Surgeon: [name], MD;  Location: [facility];  Service: Urology;  Laterality: N/A;  EXCISION OF LEFT EPIDIDYMAL CYST/SPERMATOCELE   VASECTOMY        1968  Family History  Problem Relation Age of Onset   Other Mother 19        HEAVY SMOKER   COPD Father 64        HEAVY SMOKER   Other Sister 54        HEAVY SMOKER      Social History         Socioeconomic History   Marital status: Married      Spouse name: Not on file   Number of children: 1   Years of education: Not on file   Highest education level: Not on file  Occupational History   Occupation: RETIRED  Tobacco Use   Smoking status: Never Smoker   Smokeless tobacco: Never Used  Building services engineer Use: Never used  Substance and Sexual Activity   Alcohol use: Yes      Comment: occas   Drug use: No   Sexual activity: Not on file  Other Topics Concern   Not on file  Social History Narrative   Not on file    Social Determinants of Health    Financial Resource Strain: Not on file  Food Insecurity: Not on file  Transportation Needs: Not on file  Physical Activity: Not on file  Stress: Not on file  Social  Connections: Not on file  Intimate Partner Violence: Not on file            Current Outpatient Medications  Medication Sig Dispense Refill   amLODipine (NORVASC) 10 MG tablet Take 5 mg by mouth every evening.       atorvastatin (LIPITOR) 20 MG tablet Take 20 mg by mouth every evening.       Cholecalciferol (VITAMIN D3 PO) Take 1 tablet by mouth in the morning.       finasteride (PROSCAR) 5 MG tablet Take 5 mg by mouth in the morning.       Multiple Vitamin (MULTIVITAMIN WITH MINERALS) TABS tablet Take 1 tablet by mouth in the morning.       olmesartan (BENICAR) 5 MG tablet Take 20 mg by mouth in the morning.       Omega-3 Fatty Acids (FISH OIL) 1000 MG CAPS Take 1,000 mg by mouth 2 (two) times daily.        No current facility-administered medications for this visit.           Allergies  Allergen Reactions   Hydrochlorothiazide Other (See Comments)      fatigue          Review of Systems:               General:                      normal appetite, + decreasd energy, no weight gain, no weight loss, no fever             Cardiac:                       no chest pain with exertion, no chest pain at rest, +SOB with moderate exertion, no resting SOB, no PND, no orthopnea, no palpitations, no arrhythmia, no atrial fibrillation, no LE edema, no dizzy spells, no syncope             Respiratory:                 + exertional shortness of breath, no home oxygen, no productive cough, no dry  cough, no bronchitis, no wheezing, no hemoptysis, no asthma, no pain with inspiration or cough, no sleep apnea, no CPAP at night             GI:                               no difficulty swallowing, no reflux, no frequent heartburn, no hiatal hernia, no abdominal pain, no constipation, no diarrhea, no hematochezia, no hematemesis, no melena             GU:                              no dysuria,  no frequency, no urinary tract infection, no hematuria, no enlarged prostate, no kidney stones, no kidney  disease             Vascular:                     no pain suggestive of claudication, no pain in feet, no leg cramps, no varicose veins, no DVT, no non-healing foot ulcer             Neuro:                         no stroke, no TIA's, no seizures, no headaches, no temporary blindness one eye,  no slurred speech, no peripheral neuropathy, no chronic pain, no instability of gait, no memory/cognitive dysfunction             Musculoskeletal:         no arthritis, no joint swelling, no myalgias, no difficulty walking, normal mobility             Skin:                            no rash, no itching, no skin infections, no pressure sores or ulcerations             Psych:                         no anxiety, no depression, no nervousness, no unusual recent stress             Eyes:                           no blurry vision, no floaters, no recent vision changes, no glasses or contacts             ENT:                            no hearing loss, no loose or painful teeth, no dentures, last saw dentist 08/2020             Hematologic:               no easy bruising, no abnormal bleeding, no clotting disorder, no frequent epistaxis             Endocrine:                   no diabetes, does not check CBG's at home  Physical Exam:               BP (!) 143/71   Pulse (!) 58   Resp 20   Ht 6' (1.829 m)   Wt 232 lb (105.2 kg)   SpO2 97% Comment: RA  BMI 31.46 kg/m              General:                      well-appearing and looks younger than stated age.             HEENT:                       Unremarkable, NCAT, PERLA, EOMI             Neck:                           no JVD, no bruits, no adenopathy             Chest:                          clear to auscultation, symmetrical breath sounds, no wheezes, no rhonchi             CV:                              RRR, grade lll/VI crescendo/decrescendo murmur heard best at RSB,  no diastolic murmur             Abdomen:                     soft, non-tender, no masses             Extremities:                 warm, well-perfused, pulses palpable at ankle, no LE edema             Rectal/GU                   Deferred             Neuro:                         Grossly non-focal and symmetrical throughout             Skin:                            Clean and dry, no rashes, no breakdown     Diagnostic Tests:   ECHOCARDIOGRAM REPORT         Patient Name:   Derek Washington Date of Exam: 10/27/2020  Medical Rec #:  [id number]      Height:       72.0 in  Accession #:    [id number]     Weight:       239.4 lb  Date of Birth:  1941/02/19      BSA:          2.300 m  Patient Age:    79 years       BP:           140/66 mmHg  Patient Gender: M  HR:           59 bpm.  Exam Location:  Outpatient   Procedure: 2D Echo   Indications:    Aortic Stenosis I35.0     History:        Patient has prior history of Echocardiogram examinations,  most                  recent 01/07/2019. Bicuspid AV; Risk Factors:Hypertension.     Sonographer:    Thurman Coyer RDCS (AE)  Referring Phys: [id number] JAYADEEP S VARANASI   IMPRESSIONS     1. Left ventricular ejection fraction, by estimation, is 55 to 60%. The  left ventricle has normal function. The left ventricle has no regional  wall motion abnormalities. There is mild concentric left ventricular  hypertrophy. Left ventricular diastolic  parameters are consistent with Grade I diastolic dysfunction (impaired  relaxation). Elevated left ventricular end-diastolic pressure.   2. Right ventricular systolic function is mildly reduced. The right  ventricular size is moderately enlarged. There is normal pulmonary artery  systolic pressure. The estimated right ventricular systolic pressure is  20.6 mmHg.   3. Left atrial size was severely dilated.   4. Right atrial size was mild to moderately dilated.   5. The mitral valve is normal in structure. Trivial mitral valve  regurgitation. No  evidence of mitral stenosis.   6. The aortic valve is bicuspid. There is severe calcifcation of the  aortic valve. There is severe thickening of the aortic valve. Aortic valve  regurgitation is mild. Moderate aortic valve stenosis. Aortic  regurgitation PHT measures 595 msec. Aortic  valve mean gradient measures 36.0 mmHg. Aortic valve Vmax measures 3.71  m/s.   7. Aortic dilatation noted. There is moderate dilatation of the ascending  aorta, measuring 44 mm.   8. The inferior vena cava is normal in size with greater than 50%  respiratory variability, suggesting right atrial pressure of 3 mmHg.   9. There appears to be moderate AS. The AVA is overestimated due to  overestimated LVOT diameter measurement. The dimnesionless index was 0.32.   FINDINGS   Left Ventricle: Left ventricular ejection fraction, by estimation, is 55  to 60%. The left ventricle has normal function. The left ventricle has no  regional wall motion abnormalities. The left ventricular internal cavity  size was normal in size. There is   mild concentric left ventricular hypertrophy. Left ventricular diastolic  parameters are consistent with Grade I diastolic dysfunction (impaired  relaxation). Elevated left ventricular end-diastolic pressure.   Right Ventricle: The right ventricular size is moderately enlarged. No  increase in right ventricular wall thickness. Right ventricular systolic  function is mildly reduced. There is normal pulmonary artery systolic  pressure. The tricuspid regurgitant  velocity is 2.10 m/s, and with an assumed right atrial pressure of 3 mmHg,  the estimated right ventricular systolic pressure is 20.6 mmHg.   Left Atrium: Left atrial size was severely dilated.   Right Atrium: Right atrial size was mild to moderately dilated.   Pericardium: There is no evidence of pericardial effusion.   Mitral Valve: The mitral valve is normal in structure. Trivial mitral  valve regurgitation. No evidence  of mitral valve stenosis.   Tricuspid Valve: The tricuspid valve is normal in structure. Tricuspid  valve regurgitation is mild . No evidence of tricuspid stenosis.   Aortic Valve: The aortic valve is bicuspid. There is severe calcifcation  of the aortic valve. There is severe thickening of the aortic  valve.  Aortic valve regurgitation is mild. Aortic regurgitation PHT measures 595  msec. Moderate aortic stenosis is  present. Aortic valve mean gradient measures 36.0 mmHg. Aortic valve peak  gradient measures 55.1 mmHg. Aortic valve area, by VTI measures 1.70 cm.   Pulmonic Valve: The pulmonic valve was normal in structure. Pulmonic valve  regurgitation is trivial. No evidence of pulmonic stenosis.   Aorta: Aortic dilatation noted. There is moderate dilatation of the  ascending aorta, measuring 44 mm.   Venous: The inferior vena cava is normal in size with greater than 50%  respiratory variability, suggesting right atrial pressure of 3 mmHg.   IAS/Shunts: No atrial level shunt detected by color flow Doppler.      LEFT VENTRICLE  PLAX 2D  LVIDd:         5.20 cm  Diastology  LVIDs:         3.60 cm  LV e' medial:    5.11 cm/s  LV PW:         1.20 cm  LV E/e' medial:  12.6  LV IVS:        1.30 cm  LV e' lateral:   4.57 cm/s  LVOT diam:     2.60 cm  LV E/e' lateral: 14.1  LV SV:         164  LV SV Index:   71  LVOT Area:     5.31 cm      RIGHT VENTRICLE  RV S prime:     8.81 cm/s  TAPSE (M-mode): 2.6 cm   LEFT ATRIUM              Index       RIGHT ATRIUM           Index  LA diam:        4.40 cm  1.91 cm/m  RA Area:     25.10 cm  LA Vol (A2C):   148.0 ml 64.35 ml/m RA Volume:   84.80 ml  36.87 ml/m  LA Vol (A4C):   85.0 ml  36.96 ml/m  LA Biplane Vol: 122.0 ml 53.05 ml/m   AORTIC VALVE  AV Area (Vmax):    1.65 cm  AV Area (Vmean):   1.55 cm  AV Area (VTI):     1.70 cm  AV Vmax:           371.00 cm/s  AV Vmean:          288.000 cm/s  AV VTI:            0.962 m   AV Peak Grad:      55.1 mmHg  AV Mean Grad:      36.0 mmHg  LVOT Vmax:         115.00 cm/s  LVOT Vmean:        84.300 cm/s  LVOT VTI:          0.308 m  LVOT/AV VTI ratio: 0.32  AI PHT:            595 msec     AORTA  Ao Root diam: 3.50 cm   MITRAL VALVE                TRICUSPID VALVE  MV Area (PHT): 2.42 cm     TR Peak grad:   17.6 mmHg  MV Decel Time: 313 msec     TR Vmax:        210.00 cm/s  MV E velocity:  64.30 cm/s  MV A velocity: 102.00 cm/s  SHUNTS  MV E/A ratio:  0.63         Systemic VTI:  0.31 m                              Systemic Diam: 2.60 cm   Armanda Magic MD  Electronically signed by Armanda Magic MD  Signature Date/Time: 10/27/2020/9:27:54 PM         Final       Physicians   Panel Physicians Referring Physician Case Authorizing Physician  [name], MD (Primary)          Procedures   RIGHT/LEFT HEART CATH AND CORONARY ANGIOGRAPHY    Conclusion     RPAV lesion is 50% stenosed. Hemodynamic findings consistent with aortic valve stenosis.   Derek Washington is a 80 y.o. male      [id number] LOCATION:  FACILITY: [facility] PHYSICIAN: [name], M.D. August 07, 1940     DATE OF PROCEDURE:  11/03/2020   DATE OF DISCHARGE:        CARDIAC CATHETERIZATION        History obtained from chart review.  Derek Washington is a 80 year old married Caucasian male patient of Dr. Hoyle Barr with bicuspid aortic valve, aortic stenosis and regurgitation with a thoracic aortic aneurysm who is referred for right left heart cath because of aggressive dyspnea.  He does have a history of hypertension and hyperlipidemia.     IMPRESSION: Derek Washington has minimal CAD with at most a 50% lesion in his PLA branch.  I suspect his symptoms are related to his bicuspid aortic valve stenosis/regurgitation which will be addressed by Dr. Eldridge Dace as an outpatient.  The sheath was removed and a TR band was placed on the right wrist to achieve patent hemostasis.  The patient left lab in  stable condition.   [name]. MD, [facility] 11/03/2020 11:15 AM             Recommendations   Antiplatelet/Anticoag Recommend Aspirin 81mg  daily for moderate CAD.    Indications   Nonrheumatic aortic valve stenosis [I35.0 (ICD-10-CM)]    Procedural Details   Technical Details PROCEDURE DESCRIPTION:   The patient was brought to the second floor [facility] lab in the postabsorptive state. He was premedicated with IV Versed and fentanyl. His right wrist and antecubital fossa Were prepped and shaved in usual sterile fashion. Xylocaine 1% was used for local anesthesia. A 6 French sheath was inserted into the right radial artery using standard Seldinger technique.  A 7 French sheath was inserted into the right antecubital vein.  A 7 [place] balloontipped demolition Swan-Ganz catheter was advanced through the right heart chambers obtain sequential pressures and pulmonary artery blood samples for the determination of Fick cardiac output.  5 [place] TIG catheter and Judkins catheters were used for selective coronary angiography.  I attempted to cross the aortic valve with a right Judkins catheter and straight wire unsuccessfully.  Isovue dye was used for the entirety of the case (70 cc to patient).  Retrograde aortic pressures monitored during the case.  Radial cocktail was administered via the SideArm sheath.  The patient received 5000 units  of heparin intravenously.   There was no blood loss for this procedure.   During this procedure medications were administered to achieve and maintain moderate conscious sedation while the patient's heart rate, blood pressure, and oxygen saturation were continuously monitored and I was  present face-to-face 100% of this time.    Medications (Filter: Administrations occurring from [phone] to 1100 on 11/03/20)  (important)  Continuous medications are totaled by the amount administered until 11/03/20 1100.      midazolam (VERSED) injection  (mg) Total dose:  1 mg   Date/Time Rate/Dose/Volume Action    11/03/20 1008 1 mg Given      fentaNYL (SUBLIMAZE) injection (mcg) Total dose:  25 mcg   Date/Time Rate/Dose/Volume Action    11/03/20 1008 25 mcg Given      Heparin (Porcine) in NaCl 1000-0.9 UT/500ML-% SOLN (mL) Total volume:  1,000 mL   Date/Time Rate/Dose/Volume Action    11/03/20 1008 500 mL Given    1009 500 mL Given      Radial Cocktail (Verapamil 2.5 mg, NTG, Lidocaine) (mL) Total volume:  6 mL   Date/Time Rate/Dose/Volume Action    11/03/20 1033 6 mL Given      heparin sodium (porcine) injection (Units) Total dose:  5,000 Units   Date/Time Rate/Dose/Volume Action    11/03/20 1034 5,000 Units Given      Radial Cocktail (Verapamil 2.5 mg, NTG, Lidocaine) (mL) Total volume:  4 mL   Date/Time Rate/Dose/Volume Action    11/03/20 1046 4 mL Given      iohexol (OMNIPAQUE) 350 MG/ML injection (mL) Total volume:  70 mL   Date/Time Rate/Dose/Volume Action    11/03/20 1057 70 mL Given      Sedation Time   Sedation Time Physician-1: 46 minutes 29 seconds     Contrast   Medication Name Total Dose  iohexol (OMNIPAQUE) 350 MG/ML injection 70 mL      Radiation/Fluoro   Fluoro time: 11.6 (min) DAP: 47.7 (Gycm2) Cumulative Air Kerma: 704.9 (mGy)     Coronary Findings     Diagnostic Dominance: Right   Right Coronary Artery  Right Posterior Atrioventricular Artery  RPAV lesion is 50% stenosed.    Intervention     No interventions have been documented.   Right Heart   Right Heart Pressures Hemodynamic findings consistent with aortic valve stenosis. Right atrial pressure-7/5 Right ventricular pressure-27/0 Pulmonary artery pressure- 27/10, mean 15 Pulmonary H pressure- A-wave 16, V wave 13, mean 12 Cardiac output by Fick was 7 L/min with an index of 3.1 L/min/m    Coronary Diagrams     Diagnostic Dominance: Right      Intervention       Implants         No implant  documentation for this case.    Syngo Images    Show images for CARDIAC CATHETERIZATION   Images on Long Term Storage    Show images for Derek Washington, Derek "[name]"   Link to Procedure Log   Procedure Log      Hemo Data   Flowsheet Row Most Recent Value  Fick Cardiac Output 7.09 L/min  Fick Cardiac Output Index 3.13 (L/min)/BSA  RA A Wave 7 mmHg  RA V Wave 5 mmHg  RA Mean 3 mmHg  RV Systolic Pressure 27 mmHg  RV Diastolic Pressure 0 mmHg  RV EDP 6 mmHg  PA Systolic Pressure 27 mmHg  PA Diastolic Pressure 10 mmHg  PA Mean 15 mmHg  PW A Wave 16 mmHg  PW V Wave 13 mmHg  PW Mean 12 mmHg  AO Systolic Pressure 160 mmHg  AO Diastolic Pressure 65 mmHg  AO Mean 99 mmHg  QP/QS 1  TPVR Index 4.8 HRUI  TSVR Index 31.65 HRUI  PVR SVR  Ratio 0.03  TPVR/TSVR Ratio 0.15      ADDENDUM REPORT: 11/09/2020 14:15   EXAM: OVER-READ INTERPRETATION  CT CHEST   The following report is an over-read performed by radiologist Dr. Royal Piedraaniel Entrikinof [facility] on 11/09/2020. This over-read does not include interpretation of cardiac or coronary anatomy or pathology. The coronary artery calcium score and cardiac CTA interpretation by the cardiologist is attached.   COMPARISON:  None.   FINDINGS: Extracardiac findings are described separately under dictation for contemporaneously obtained CTA chest, abdomen and pelvis.   IMPRESSION: Please see separate dictation for contemporaneously obtained CTA chest, abdomen and pelvis dated 11/09/2020 for full description of relevant extracardiac findings.     Electronically Signed   By: Trudie Reedaniel  Entrikin M.D.   On: 11/09/2020 14:15    Addended by Florencia ReasonsEntrikin, Daniel W, MD on 11/09/2020  2:17 PM      Study Result   Narrative & Impression  CLINICAL DATA:  Aortic Stenosis   EXAM: Cardiac TAVR CT   TECHNIQUE: The patient was scanned on a Siemens Force 192 slice scanner. A 120 kV retrospective scan was triggered in the ascending  thoracic aorta at 140 HU's. Gantry rotation speed was 250 msecs and collimation was .6 mm. No beta blockade or nitro were given. The 3D data set was reconstructed in 5% intervals of the R-R cycle. Systolic and diastolic phases were analyzed on a dedicated work station using MPR, MIP and VRT modes. The patient received 80 cc of contrast.   FINDINGS: Aortic Valve: Bicuspid Sievers type one with raphe between right and left cusps Heavily calcified score 5345   Aorta: Mild diffuse dilatation with moderate calcific atherosclerosis   Sino-tubular Junction: 31 mm   Ascending Thoracic Aorta: 42 mm   Descending Thoracic Aorta: 29 mm   Sinus of Valsalva Measurements:   Left Sinus: 35.2 mm   Right sinus 32.7 mm   Non Sinus: 37.1 mm   Coronary Artery Height above Annulus:   Left Main: 12.95 mm above annulus   Right Coronary: 15.3 mm above annulus   Virtual Basal Annulus Measurements:   Maximum / Minimum Diameter: 29.2 mm x 27.2 mm   Perimeter: 93 mm   Area: 641 mm2   Coronary Arteries: Sufficient height above annulus for deployment   Optimum Fluoroscopic Angle for Delivery: LAO 7 Caudal 14 degrees   IMPRESSION: 1. Bicuspid AV Sievers 1 fused right and left cusp with calcium score 5345   2.  Annular area of 641 mm2 suitable for a 29 mm Sapien 3 valve   3.  Dilated aortic root 4.2 cm   4. Areas of calcification in the aortic annulus and STJ in setting of bicuspid valve increases risk of PVL   5.  Coronary arteries sufficient height above annulus for deployment   Charlton HawsPeter Nishan   Electronically Signed: By: Charlton HawsPeter  Nishan M.D. On: 11/09/2020 11:20        Impression:   This 80 year old gentleman has a bicuspid aortic valve with fusion of the left and right cusp and very heavy calcification of the valve with a calcium score of 5345.  There is significant calcification in the aortic annulus.  With a heavily calcified bicuspid aortic valve I think the best option for  treatment is open surgical aortic valve replacement to prevent the likely complication of paravalvular leak.  He has mild dilatation of the ascending aorta but it is still below the threshold where I would replace his aorta especially in a 80 year old patient.  I discussed the alternative of transcatheter aortic valve replacement and my concerns about doing that in the setting of a heavily calcified bicuspid aortic valve.  He understands and is in agreement with proceeding with open surgery using a bioprosthetic aortic valve. I discussed the operative procedure with the patient and his wife including alternatives, benefits and risks; including but not limited to bleeding, blood transfusion, infection, stroke, myocardial infarction, graft failure, heart block requiring a permanent pacemaker, organ dysfunction, and death.  [name] understands and agrees to proceed.     Plan:  Surgical aortic valve replacement.          [name], MD

## 2020-12-01 ENCOUNTER — Inpatient Hospital Stay (HOSPITAL_COMMUNITY)
Admission: RE | Admit: 2020-12-01 | Discharge: 2020-12-08 | DRG: 220 | Disposition: A | Discharge status: Home / Self Care | Payer: Medicare Other | Attending: Surgery | Admitting: Surgery

## 2020-12-01 ENCOUNTER — Inpatient Hospital Stay (HOSPITAL_COMMUNITY): Payer: Medicare Other

## 2020-12-01 ENCOUNTER — Inpatient Hospital Stay (HOSPITAL_COMMUNITY): Payer: Medicare Other | Admitting: Anesthesiology

## 2020-12-01 ENCOUNTER — Other Ambulatory Visit: Payer: Self-pay

## 2020-12-01 ENCOUNTER — Encounter (HOSPITAL_COMMUNITY): Payer: Self-pay | Admitting: Surgery

## 2020-12-01 ENCOUNTER — Inpatient Hospital Stay (HOSPITAL_COMMUNITY): Payer: Medicare Other | Admitting: Physician Assistant

## 2020-12-01 ENCOUNTER — Encounter (HOSPITAL_COMMUNITY)
Admission: RE | Disposition: A | Discharge status: Home / Self Care | Payer: Self-pay | Source: Home / Self Care | Attending: Surgery

## 2020-12-01 DIAGNOSIS — E559 Vitamin D deficiency, unspecified: Secondary | ICD-10-CM | POA: Diagnosis present

## 2020-12-01 DIAGNOSIS — I10 Essential (primary) hypertension: Secondary | ICD-10-CM | POA: Diagnosis present

## 2020-12-01 DIAGNOSIS — Z96653 Presence of artificial knee joint, bilateral: Secondary | ICD-10-CM | POA: Diagnosis present

## 2020-12-01 DIAGNOSIS — I350 Nonrheumatic aortic (valve) stenosis: Principal | ICD-10-CM

## 2020-12-01 DIAGNOSIS — Z79899 Other long term (current) drug therapy: Secondary | ICD-10-CM | POA: Diagnosis not present

## 2020-12-01 DIAGNOSIS — E7800 Pure hypercholesterolemia, unspecified: Secondary | ICD-10-CM | POA: Diagnosis present

## 2020-12-01 DIAGNOSIS — I2510 Atherosclerotic heart disease of native coronary artery without angina pectoris: Secondary | ICD-10-CM | POA: Diagnosis present

## 2020-12-01 DIAGNOSIS — Z20822 Contact with and (suspected) exposure to covid-19: Secondary | ICD-10-CM | POA: Diagnosis present

## 2020-12-01 DIAGNOSIS — E8770 Fluid overload, unspecified: Secondary | ICD-10-CM | POA: Diagnosis not present

## 2020-12-01 DIAGNOSIS — K219 Gastro-esophageal reflux disease without esophagitis: Secondary | ICD-10-CM | POA: Diagnosis present

## 2020-12-01 DIAGNOSIS — E785 Hyperlipidemia, unspecified: Secondary | ICD-10-CM | POA: Diagnosis present

## 2020-12-01 DIAGNOSIS — D62 Acute posthemorrhagic anemia: Secondary | ICD-10-CM | POA: Diagnosis not present

## 2020-12-01 DIAGNOSIS — N400 Enlarged prostate without lower urinary tract symptoms: Secondary | ICD-10-CM | POA: Diagnosis present

## 2020-12-01 DIAGNOSIS — Z952 Presence of prosthetic heart valve: Principal | ICD-10-CM

## 2020-12-01 DIAGNOSIS — Z825 Family history of asthma and other chronic lower respiratory diseases: Secondary | ICD-10-CM

## 2020-12-01 DIAGNOSIS — I480 Paroxysmal atrial fibrillation: Secondary | ICD-10-CM | POA: Diagnosis not present

## 2020-12-01 DIAGNOSIS — I441 Atrioventricular block, second degree: Secondary | ICD-10-CM | POA: Diagnosis present

## 2020-12-01 DIAGNOSIS — I4892 Unspecified atrial flutter: Secondary | ICD-10-CM | POA: Diagnosis not present

## 2020-12-01 DIAGNOSIS — I442 Atrioventricular block, complete: Secondary | ICD-10-CM | POA: Diagnosis not present

## 2020-12-01 DIAGNOSIS — Z959 Presence of cardiac and vascular implant and graft, unspecified: Secondary | ICD-10-CM

## 2020-12-01 DIAGNOSIS — J9811 Atelectasis: Secondary | ICD-10-CM

## 2020-12-01 HISTORY — PX: TEE WITHOUT CARDIOVERSION: SHX5443

## 2020-12-01 HISTORY — PX: AORTIC VALVE REPLACEMENT: SHX41

## 2020-12-01 LAB — POCT I-STAT 7, (LYTES, BLD GAS, ICA,H+H)
Acid-Base Excess: 1 mmol/L (ref 0.0–2.0)
Acid-Base Excess: 0 mmol/L (ref 0.0–2.0)
Acid-Base Excess: 3 mmol/L — ABNORMAL HIGH (ref 0.0–2.0)
Acid-base deficit: 1 mmol/L (ref 0.0–2.0)
Acid-base deficit: 3 mmol/L — ABNORMAL HIGH (ref 0.0–2.0)
Acid-base deficit: 2 mmol/L (ref 0.0–2.0)
Acid-base deficit: 5 mmol/L — ABNORMAL HIGH (ref 0.0–2.0)
Bicarbonate: 27.8 mmol/L (ref 20.0–28.0)
Bicarbonate: 24.4 mmol/L (ref 20.0–28.0)
Bicarbonate: 27.4 mmol/L (ref 20.0–28.0)
Bicarbonate: 23.9 mmol/L (ref 20.0–28.0)
Bicarbonate: 22.9 mmol/L (ref 20.0–28.0)
Bicarbonate: 19.3 mmol/L — ABNORMAL LOW (ref 20.0–28.0)
Bicarbonate: 19.8 mmol/L — ABNORMAL LOW (ref 20.0–28.0)
Calcium, Ion: 1.31 mmol/L (ref 1.15–1.40)
Calcium, Ion: 1.08 mmol/L — ABNORMAL LOW (ref 1.15–1.40)
Calcium, Ion: 1.14 mmol/L — ABNORMAL LOW (ref 1.15–1.40)
Calcium, Ion: 1.12 mmol/L — ABNORMAL LOW (ref 1.15–1.40)
Calcium, Ion: 1.22 mmol/L (ref 1.15–1.40)
Calcium, Ion: 1.14 mmol/L — ABNORMAL LOW (ref 1.15–1.40)
Calcium, Ion: 1.21 mmol/L (ref 1.15–1.40)
HCT: 34 % — ABNORMAL LOW (ref 39.0–52.0)
HCT: 27 % — ABNORMAL LOW (ref 39.0–52.0)
HCT: 26 % — ABNORMAL LOW (ref 39.0–52.0)
HCT: 26 % — ABNORMAL LOW (ref 39.0–52.0)
HCT: 29 % — ABNORMAL LOW (ref 39.0–52.0)
HCT: 29 % — ABNORMAL LOW (ref 39.0–52.0)
HCT: 28 % — ABNORMAL LOW (ref 39.0–52.0)
Hemoglobin: 11.6 g/dL — ABNORMAL LOW (ref 13.0–17.0)
Hemoglobin: 9.2 g/dL — ABNORMAL LOW (ref 13.0–17.0)
Hemoglobin: 8.8 g/dL — ABNORMAL LOW (ref 13.0–17.0)
Hemoglobin: 8.8 g/dL — ABNORMAL LOW (ref 13.0–17.0)
Hemoglobin: 9.9 g/dL — ABNORMAL LOW (ref 13.0–17.0)
Hemoglobin: 9.9 g/dL — ABNORMAL LOW (ref 13.0–17.0)
Hemoglobin: 9.5 g/dL — ABNORMAL LOW (ref 13.0–17.0)
O2 Saturation: 100 %
O2 Saturation: 100 %
O2 Saturation: 100 %
O2 Saturation: 99 %
O2 Saturation: 93 %
O2 Saturation: 99 %
O2 Saturation: 98 %
Patient temperature: 35.2
Patient temperature: 37.1
Patient temperature: 37.6
Potassium: 3.7 mmol/L (ref 3.5–5.1)
Potassium: 3.8 mmol/L (ref 3.5–5.1)
Potassium: 4.3 mmol/L (ref 3.5–5.1)
Potassium: 4.6 mmol/L (ref 3.5–5.1)
Potassium: 3.9 mmol/L (ref 3.5–5.1)
Potassium: 4.3 mmol/L (ref 3.5–5.1)
Potassium: 4.2 mmol/L (ref 3.5–5.1)
Sodium: 143 mmol/L (ref 135–145)
Sodium: 142 mmol/L (ref 135–145)
Sodium: 140 mmol/L (ref 135–145)
Sodium: 142 mmol/L (ref 135–145)
Sodium: 143 mmol/L (ref 135–145)
Sodium: 142 mmol/L (ref 135–145)
Sodium: 141 mmol/L (ref 135–145)
TCO2: 29 mmol/L (ref 22–32)
TCO2: 26 mmol/L (ref 22–32)
TCO2: 29 mmol/L (ref 22–32)
TCO2: 25 mmol/L (ref 22–32)
TCO2: 24 mmol/L (ref 22–32)
TCO2: 20 mmol/L — ABNORMAL LOW (ref 22–32)
TCO2: 21 mmol/L — ABNORMAL LOW (ref 22–32)
pCO2 arterial: 52.6 mmHg — ABNORMAL HIGH (ref 32.0–48.0)
pCO2 arterial: 38.5 mmHg (ref 32.0–48.0)
pCO2 arterial: 42.2 mmHg (ref 32.0–48.0)
pCO2 arterial: 37.4 mmHg (ref 32.0–48.0)
pCO2 arterial: 40.3 mmHg (ref 32.0–48.0)
pCO2 arterial: 22.4 mmHg — ABNORMAL LOW (ref 32.0–48.0)
pCO2 arterial: 33.9 mmHg (ref 32.0–48.0)
pH, Arterial: 7.331 — ABNORMAL LOW (ref 7.350–7.450)
pH, Arterial: 7.411 (ref 7.350–7.450)
pH, Arterial: 7.421 (ref 7.350–7.450)
pH, Arterial: 7.414 (ref 7.350–7.450)
pH, Arterial: 7.353 (ref 7.350–7.450)
pH, Arterial: 7.543 — ABNORMAL HIGH (ref 7.350–7.450)
pH, Arterial: 7.377 (ref 7.350–7.450)
pO2, Arterial: 280 mmHg — ABNORMAL HIGH (ref 83.0–108.0)
pO2, Arterial: 357 mmHg — ABNORMAL HIGH (ref 83.0–108.0)
pO2, Arterial: 436 mmHg — ABNORMAL HIGH (ref 83.0–108.0)
pO2, Arterial: 118 mmHg — ABNORMAL HIGH (ref 83.0–108.0)
pO2, Arterial: 66 mmHg — ABNORMAL LOW (ref 83.0–108.0)
pO2, Arterial: 127 mmHg — ABNORMAL HIGH (ref 83.0–108.0)
pO2, Arterial: 112 mmHg — ABNORMAL HIGH (ref 83.0–108.0)

## 2020-12-01 LAB — POCT I-STAT, CHEM 8
BUN: 22 mg/dL (ref 8–23)
BUN: 21 mg/dL (ref 8–23)
BUN: 18 mg/dL (ref 8–23)
BUN: 18 mg/dL (ref 8–23)
BUN: 23 mg/dL (ref 8–23)
Calcium, Ion: 1.34 mmol/L (ref 1.15–1.40)
Calcium, Ion: 1.26 mmol/L (ref 1.15–1.40)
Calcium, Ion: 1.16 mmol/L (ref 1.15–1.40)
Calcium, Ion: 1.16 mmol/L (ref 1.15–1.40)
Calcium, Ion: 1.15 mmol/L (ref 1.15–1.40)
Chloride: 105 mmol/L (ref 98–111)
Chloride: 106 mmol/L (ref 98–111)
Chloride: 104 mmol/L (ref 98–111)
Chloride: 106 mmol/L (ref 98–111)
Chloride: 107 mmol/L (ref 98–111)
Creatinine, Ser: 0.9 mg/dL (ref 0.61–1.24)
Creatinine, Ser: 0.9 mg/dL (ref 0.61–1.24)
Creatinine, Ser: 0.8 mg/dL (ref 0.61–1.24)
Creatinine, Ser: 0.8 mg/dL (ref 0.61–1.24)
Creatinine, Ser: 0.8 mg/dL (ref 0.61–1.24)
Glucose, Bld: 105 mg/dL — ABNORMAL HIGH (ref 70–99)
Glucose, Bld: 109 mg/dL — ABNORMAL HIGH (ref 70–99)
Glucose, Bld: 125 mg/dL — ABNORMAL HIGH (ref 70–99)
Glucose, Bld: 126 mg/dL — ABNORMAL HIGH (ref 70–99)
Glucose, Bld: 132 mg/dL — ABNORMAL HIGH (ref 70–99)
HCT: 32 % — ABNORMAL LOW (ref 39.0–52.0)
HCT: 30 % — ABNORMAL LOW (ref 39.0–52.0)
HCT: 26 % — ABNORMAL LOW (ref 39.0–52.0)
HCT: 27 % — ABNORMAL LOW (ref 39.0–52.0)
HCT: 24 % — ABNORMAL LOW (ref 39.0–52.0)
Hemoglobin: 10.9 g/dL — ABNORMAL LOW (ref 13.0–17.0)
Hemoglobin: 10.2 g/dL — ABNORMAL LOW (ref 13.0–17.0)
Hemoglobin: 8.8 g/dL — ABNORMAL LOW (ref 13.0–17.0)
Hemoglobin: 9.2 g/dL — ABNORMAL LOW (ref 13.0–17.0)
Hemoglobin: 8.2 g/dL — ABNORMAL LOW (ref 13.0–17.0)
Potassium: 3.7 mmol/L (ref 3.5–5.1)
Potassium: 3.8 mmol/L (ref 3.5–5.1)
Potassium: 4.5 mmol/L (ref 3.5–5.1)
Potassium: 4.6 mmol/L (ref 3.5–5.1)
Potassium: 4.6 mmol/L (ref 3.5–5.1)
Sodium: 142 mmol/L (ref 135–145)
Sodium: 141 mmol/L (ref 135–145)
Sodium: 139 mmol/L (ref 135–145)
Sodium: 140 mmol/L (ref 135–145)
Sodium: 141 mmol/L (ref 135–145)
TCO2: 28 mmol/L (ref 22–32)
TCO2: 25 mmol/L (ref 22–32)
TCO2: 27 mmol/L (ref 22–32)
TCO2: 26 mmol/L (ref 22–32)
TCO2: 24 mmol/L (ref 22–32)

## 2020-12-01 LAB — PROTIME-INR
INR: 1.5 — ABNORMAL HIGH (ref 0.8–1.2)
Prothrombin Time: 18 seconds — ABNORMAL HIGH (ref 11.4–15.2)

## 2020-12-01 LAB — PREPARE RBC (CROSSMATCH)

## 2020-12-01 LAB — POCT I-STAT EG7
Acid-Base Excess: 0 mmol/L (ref 0.0–2.0)
Bicarbonate: 24.8 mmol/L (ref 20.0–28.0)
Calcium, Ion: 1.11 mmol/L — ABNORMAL LOW (ref 1.15–1.40)
HCT: 26 % — ABNORMAL LOW (ref 39.0–52.0)
Hemoglobin: 8.8 g/dL — ABNORMAL LOW (ref 13.0–17.0)
O2 Saturation: 84 %
Potassium: 3.8 mmol/L (ref 3.5–5.1)
Sodium: 143 mmol/L (ref 135–145)
TCO2: 26 mmol/L (ref 22–32)
pCO2, Ven: 39.7 mmHg — ABNORMAL LOW (ref 44.0–60.0)
pH, Ven: 7.403 (ref 7.250–7.430)
pO2, Ven: 49 mmHg — ABNORMAL HIGH (ref 32.0–45.0)

## 2020-12-01 LAB — CBC
HCT: 30.8 % — ABNORMAL LOW (ref 39.0–52.0)
HCT: 30.8 % — ABNORMAL LOW (ref 39.0–52.0)
Hemoglobin: 10.2 g/dL — ABNORMAL LOW (ref 13.0–17.0)
Hemoglobin: 10.7 g/dL — ABNORMAL LOW (ref 13.0–17.0)
MCH: 32 pg (ref 26.0–34.0)
MCH: 33 pg (ref 26.0–34.0)
MCHC: 33.1 g/dL (ref 30.0–36.0)
MCHC: 34.7 g/dL (ref 30.0–36.0)
MCV: 96.6 fL (ref 80.0–100.0)
MCV: 95.1 fL (ref 80.0–100.0)
Platelets: 107 10*3/uL — ABNORMAL LOW (ref 150–400)
Platelets: 120 10*3/uL — ABNORMAL LOW (ref 150–400)
RBC: 3.19 MIL/uL — ABNORMAL LOW (ref 4.22–5.81)
RBC: 3.24 MIL/uL — ABNORMAL LOW (ref 4.22–5.81)
RDW: 13.2 % (ref 11.5–15.5)
RDW: 13.3 % (ref 11.5–15.5)
WBC: 11 10*3/uL — ABNORMAL HIGH (ref 4.0–10.5)
WBC: 11.5 10*3/uL — ABNORMAL HIGH (ref 4.0–10.5)
nRBC: 0 % (ref 0.0–0.2)
nRBC: 0 % (ref 0.0–0.2)

## 2020-12-01 LAB — ABO/RH: ABO/RH(D): A POS

## 2020-12-01 LAB — HEMOGLOBIN AND HEMATOCRIT, BLOOD
HCT: 28.2 % — ABNORMAL LOW (ref 39.0–52.0)
Hemoglobin: 9.7 g/dL — ABNORMAL LOW (ref 13.0–17.0)

## 2020-12-01 LAB — BASIC METABOLIC PANEL
Anion gap: 8 (ref 5–15)
BUN: 16 mg/dL (ref 8–23)
CO2: 21 mmol/L — ABNORMAL LOW (ref 22–32)
Calcium: 8 mg/dL — ABNORMAL LOW (ref 8.9–10.3)
Chloride: 110 mmol/L (ref 98–111)
Creatinine, Ser: 0.94 mg/dL (ref 0.61–1.24)
GFR, Estimated: >60 mL/min (ref >60)
Glucose, Bld: 119 mg/dL — ABNORMAL HIGH (ref 70–99)
Potassium: 4.1 mmol/L (ref 3.5–5.1)
Sodium: 139 mmol/L (ref 135–145)

## 2020-12-01 LAB — MAGNESIUM: Magnesium: 2.7 mg/dL — ABNORMAL HIGH (ref 1.7–2.4)

## 2020-12-01 LAB — GLUCOSE, CAPILLARY
Glucose-Capillary: 108 mg/dL — ABNORMAL HIGH (ref 70–99)
Glucose-Capillary: 109 mg/dL — ABNORMAL HIGH (ref 70–99)
Glucose-Capillary: 110 mg/dL — ABNORMAL HIGH (ref 70–99)
Glucose-Capillary: 112 mg/dL — ABNORMAL HIGH (ref 70–99)
Glucose-Capillary: 118 mg/dL — ABNORMAL HIGH (ref 70–99)
Glucose-Capillary: 120 mg/dL — ABNORMAL HIGH (ref 70–99)
Glucose-Capillary: 125 mg/dL — ABNORMAL HIGH (ref 70–99)
Glucose-Capillary: 132 mg/dL — ABNORMAL HIGH (ref 70–99)
Glucose-Capillary: 152 mg/dL — ABNORMAL HIGH (ref 70–99)

## 2020-12-01 LAB — ECHO INTRAOPERATIVE TEE
AV Mean grad: 34 mmHg
Height: 72 in
Weight: 3792 oz

## 2020-12-01 LAB — PLATELET COUNT: Platelets: 126 10*3/uL — ABNORMAL LOW (ref 150–400)

## 2020-12-01 LAB — APTT: aPTT: 30 seconds (ref 24–36)

## 2020-12-01 SURGERY — REPLACEMENT, AORTIC VALVE, OPEN
Anesthesia: General | Site: Chest

## 2020-12-01 MED ORDER — POTASSIUM CHLORIDE 10 MEQ/50ML IV SOLN
10.0000 meq | INTRAVENOUS | Status: AC
  Administered 2020-12-01 (×3): 10 meq via INTRAVENOUS

## 2020-12-01 MED ORDER — LACTATED RINGERS IV SOLN: INTRAVENOUS | Status: DC

## 2020-12-01 MED ORDER — HEPARIN SODIUM (PORCINE) 1000 UNIT/ML IJ SOLN
INTRAMUSCULAR | Status: DC | PRN
  Administered 2020-12-01: 38000 [IU] via INTRAVENOUS

## 2020-12-01 MED ORDER — LACTATED RINGERS IV SOLN: INTRAVENOUS | Status: DC | PRN

## 2020-12-01 MED ORDER — ATORVASTATIN CALCIUM 10 MG PO TABS
20.0000 mg | ORAL_TABLET | Freq: Every evening | ORAL | Status: DC
  Administered 2020-12-02 – 2020-12-07 (×6): 20 mg via ORAL
  Filled 2020-12-01 (×7): qty 2

## 2020-12-01 MED ORDER — CHLORHEXIDINE GLUCONATE 4 % EX LIQD: 30.0000 mL | CUTANEOUS | Status: DC

## 2020-12-01 MED ORDER — BISACODYL 5 MG PO TBEC
10.0000 mg | DELAYED_RELEASE_TABLET | Freq: Every day | ORAL | Status: DC
  Administered 2020-12-02 – 2020-12-05 (×4): 10 mg via ORAL
  Filled 2020-12-01 (×5): qty 2

## 2020-12-01 MED ORDER — MAGNESIUM SULFATE 4 GM/100ML IV SOLN
4.0000 g | Freq: Once | INTRAVENOUS | Status: AC
Start: 2020-12-01 — End: 2020-12-01
  Administered 2020-12-01: 4 g via INTRAVENOUS
  Filled 2020-12-01: qty 100

## 2020-12-01 MED ORDER — SODIUM CHLORIDE 0.9% FLUSH: 10.0000 mL | INTRAVENOUS | Status: DC | PRN

## 2020-12-01 MED ORDER — ASPIRIN 81 MG PO CHEW: 324.0000 mg | CHEWABLE_TABLET | Freq: Every day | ORAL | Status: DC

## 2020-12-01 MED ORDER — FENTANYL CITRATE (PF) 100 MCG/2ML IJ SOLN
INTRAMUSCULAR | Status: DC | PRN
  Administered 2020-12-01 (×2): 100 ug via INTRAVENOUS
  Administered 2020-12-01 (×4): 50 ug via INTRAVENOUS
  Administered 2020-12-01: 200 ug via INTRAVENOUS
  Administered 2020-12-01: 50 ug via INTRAVENOUS
  Administered 2020-12-01 (×3): 150 ug via INTRAVENOUS
  Administered 2020-12-01: 500 ug via INTRAVENOUS

## 2020-12-01 MED ORDER — PROTAMINE SULFATE 10 MG/ML IV SOLN
INTRAVENOUS | Status: AC
  Filled 2020-12-01: qty 15

## 2020-12-01 MED ORDER — ACETAMINOPHEN 160 MG/5ML PO SOLN
1000.0000 mg | Freq: Four times a day (QID) | ORAL | Status: DC
  Filled 2020-12-01: qty 40.6

## 2020-12-01 MED ORDER — ACETAMINOPHEN 650 MG RE SUPP
650.0000 mg | Freq: Once | RECTAL | Status: AC
  Administered 2020-12-01: 650 mg via RECTAL

## 2020-12-01 MED ORDER — SODIUM CHLORIDE 0.45 % IV SOLN: INTRAVENOUS | Status: DC | PRN

## 2020-12-01 MED ORDER — FAMOTIDINE IN NACL 20-0.9 MG/50ML-% IV SOLN
20.0000 mg | Freq: Two times a day (BID) | INTRAVENOUS | Status: AC
  Administered 2020-12-01 (×2): 20 mg via INTRAVENOUS
  Filled 2020-12-01 (×2): qty 50

## 2020-12-01 MED ORDER — PROPOFOL 10 MG/ML IV BOLUS
INTRAVENOUS | Status: AC
  Filled 2020-12-01: qty 20

## 2020-12-01 MED ORDER — HEPARIN SODIUM (PORCINE) 1000 UNIT/ML IJ SOLN
INTRAMUSCULAR | Status: AC
  Filled 2020-12-01: qty 1

## 2020-12-01 MED ORDER — ACETAMINOPHEN 500 MG PO TABS
1000.0000 mg | ORAL_TABLET | Freq: Four times a day (QID) | ORAL | Status: AC
  Administered 2020-12-01 – 2020-12-06 (×16): 1000 mg via ORAL
  Filled 2020-12-01 (×16): qty 2

## 2020-12-01 MED ORDER — PROTAMINE SULFATE 10 MG/ML IV SOLN
INTRAVENOUS | Status: DC | PRN
  Administered 2020-12-01: 380 mg via INTRAVENOUS

## 2020-12-01 MED ORDER — CHLORHEXIDINE GLUCONATE 0.12 % MT SOLN
15.0000 mL | Freq: Once | OROMUCOSAL | Status: AC
  Administered 2020-12-01: 15 mL via OROMUCOSAL
  Filled 2020-12-01: qty 15

## 2020-12-01 MED ORDER — OXYCODONE HCL 5 MG PO TABS
5.0000 mg | ORAL_TABLET | ORAL | Status: DC | PRN
  Administered 2020-12-02: 10 mg via ORAL
  Administered 2020-12-02: 5 mg via ORAL
  Administered 2020-12-02 (×2): 10 mg via ORAL
  Administered 2020-12-02 – 2020-12-03 (×2): 5 mg via ORAL
  Administered 2020-12-03: 10 mg via ORAL
  Administered 2020-12-03: 5 mg via ORAL
  Administered 2020-12-07 – 2020-12-08 (×2): 10 mg via ORAL
  Filled 2020-12-01: qty 1
  Filled 2020-12-01 (×7): qty 2
  Filled 2020-12-01 (×2): qty 1

## 2020-12-01 MED ORDER — 0.9 % SODIUM CHLORIDE (POUR BTL) OPTIME
TOPICAL | Status: DC | PRN
  Administered 2020-12-01: 5000 mL

## 2020-12-01 MED ORDER — CHLORHEXIDINE GLUCONATE CLOTH 2 % EX PADS
6.0000 | MEDICATED_PAD | Freq: Every day | CUTANEOUS | Status: DC
  Administered 2020-12-03: 6 via TOPICAL

## 2020-12-01 MED ORDER — PROPOFOL 10 MG/ML IV BOLUS
INTRAVENOUS | Status: DC | PRN
  Administered 2020-12-01: 30 mg via INTRAVENOUS
  Administered 2020-12-01: 20 mg via INTRAVENOUS

## 2020-12-01 MED ORDER — PHENYLEPHRINE HCL-NACL 10-0.9 MG/250ML-% IV SOLN: INTRAVENOUS | Status: DC | PRN

## 2020-12-01 MED ORDER — ~~LOC~~ CARDIAC SURGERY, PATIENT & FAMILY EDUCATION
Freq: Once | Status: DC
  Filled 2020-12-01: qty 1

## 2020-12-01 MED ORDER — SODIUM CHLORIDE 0.9% FLUSH: 3.0000 mL | INTRAVENOUS | Status: DC | PRN

## 2020-12-01 MED ORDER — FENTANYL CITRATE (PF) 250 MCG/5ML IJ SOLN
INTRAMUSCULAR | Status: AC
  Filled 2020-12-01: qty 5

## 2020-12-01 MED ORDER — CEFAZOLIN SODIUM-DEXTROSE 2-4 GM/100ML-% IV SOLN
2.0000 g | Freq: Three times a day (TID) | INTRAVENOUS | Status: AC
  Administered 2020-12-01 – 2020-12-03 (×6): 2 g via INTRAVENOUS
  Filled 2020-12-01 (×8): qty 100

## 2020-12-01 MED ORDER — ACETAMINOPHEN 160 MG/5ML PO SOLN: 650.0000 mg | Freq: Once | ORAL | Status: AC

## 2020-12-01 MED ORDER — SODIUM CHLORIDE 0.9% FLUSH
3.0000 mL | Freq: Two times a day (BID) | INTRAVENOUS | Status: DC
  Administered 2020-12-02: 10 mL via INTRAVENOUS
  Administered 2020-12-02 – 2020-12-03 (×2): 3 mL via INTRAVENOUS
  Administered 2020-12-03: 10 mL via INTRAVENOUS
  Administered 2020-12-04 – 2020-12-07 (×7): 3 mL via INTRAVENOUS

## 2020-12-01 MED ORDER — ROCURONIUM BROMIDE 10 MG/ML (PF) SYRINGE
PREFILLED_SYRINGE | INTRAVENOUS | Status: AC
  Filled 2020-12-01: qty 10

## 2020-12-01 MED ORDER — ORAL CARE MOUTH RINSE
15.0000 mL | Freq: Two times a day (BID) | OROMUCOSAL | Status: DC
  Administered 2020-12-01 – 2020-12-07 (×11): 15 mL via OROMUCOSAL

## 2020-12-01 MED ORDER — BISACODYL 10 MG RE SUPP: 10.0000 mg | Freq: Every day | RECTAL | Status: DC

## 2020-12-01 MED ORDER — DOCUSATE SODIUM 100 MG PO CAPS
200.0000 mg | ORAL_CAPSULE | Freq: Every day | ORAL | Status: DC
  Administered 2020-12-02 – 2020-12-05 (×4): 200 mg via ORAL
  Filled 2020-12-01 (×5): qty 2

## 2020-12-01 MED ORDER — LACTATED RINGERS IV SOLN
500.0000 mL | Freq: Once | INTRAVENOUS | Status: AC | PRN
  Administered 2020-12-01: 500 mL via INTRAVENOUS

## 2020-12-01 MED ORDER — METOPROLOL TARTRATE 25 MG/10 ML ORAL SUSPENSION: 12.5000 mg | Freq: Two times a day (BID) | ORAL | Status: DC

## 2020-12-01 MED ORDER — HEMOSTATIC AGENTS (NO CHARGE) OPTIME
TOPICAL | Status: DC | PRN
  Administered 2020-12-01: 1 via TOPICAL

## 2020-12-01 MED ORDER — SODIUM CHLORIDE 0.9% FLUSH
10.0000 mL | Freq: Two times a day (BID) | INTRAVENOUS | Status: DC
  Administered 2020-12-01 – 2020-12-02 (×3): 10 mL

## 2020-12-01 MED ORDER — THROMBIN 20000 UNITS EX SOLR
OROMUCOSAL | Status: DC | PRN
  Administered 2020-12-01 (×3): 4 mL via TOPICAL

## 2020-12-01 MED ORDER — MIDAZOLAM HCL (PF) 10 MG/2ML IJ SOLN
INTRAMUSCULAR | Status: AC
  Filled 2020-12-01: qty 2

## 2020-12-01 MED ORDER — ORAL CARE MOUTH RINSE: 15.0000 mL | Freq: Once | OROMUCOSAL | Status: AC

## 2020-12-01 MED ORDER — METOPROLOL TARTRATE 12.5 MG HALF TABLET
12.5000 mg | ORAL_TABLET | Freq: Once | ORAL | Status: AC
  Administered 2020-12-01: 12.5 mg via ORAL
  Filled 2020-12-01: qty 1

## 2020-12-01 MED ORDER — ALBUMIN HUMAN 5 % IV SOLN
250.0000 mL | INTRAVENOUS | Status: DC | PRN
  Administered 2020-12-01 (×2): 12.5 g via INTRAVENOUS

## 2020-12-01 MED ORDER — SODIUM CHLORIDE 0.9 % IV SOLN: INTRAVENOUS | Status: DC

## 2020-12-01 MED ORDER — CHLORHEXIDINE GLUCONATE 0.12 % MT SOLN
15.0000 mL | OROMUCOSAL | Status: AC
  Administered 2020-12-01: 15 mL via OROMUCOSAL

## 2020-12-01 MED ORDER — ONDANSETRON HCL 4 MG/2ML IJ SOLN: 4.0000 mg | Freq: Four times a day (QID) | INTRAMUSCULAR | Status: DC | PRN

## 2020-12-01 MED ORDER — PROTAMINE SULFATE 10 MG/ML IV SOLN
INTRAVENOUS | Status: AC
  Filled 2020-12-01: qty 25

## 2020-12-01 MED ORDER — THROMBIN (RECOMBINANT) 20000 UNITS EX SOLR
CUTANEOUS | Status: AC
  Filled 2020-12-01: qty 20000

## 2020-12-01 MED ORDER — MIDAZOLAM HCL 5 MG/5ML IJ SOLN
INTRAMUSCULAR | Status: DC | PRN
  Administered 2020-12-01: 2 mg via INTRAVENOUS
  Administered 2020-12-01 (×4): 1 mg via INTRAVENOUS

## 2020-12-01 MED ORDER — NICARDIPINE HCL IN NACL 20-0.86 MG/200ML-% IV SOLN
3.0000 mg/h | INTRAVENOUS | Status: DC
  Administered 2020-12-01: 5 mg/h via INTRAVENOUS
  Filled 2020-12-01 (×2): qty 200

## 2020-12-01 MED ORDER — METOPROLOL TARTRATE 5 MG/5ML IV SOLN: 2.5000 mg | INTRAVENOUS | Status: DC | PRN

## 2020-12-01 MED ORDER — MIDAZOLAM HCL 2 MG/2ML IJ SOLN: 2.0000 mg | INTRAMUSCULAR | Status: DC | PRN

## 2020-12-01 MED ORDER — PANTOPRAZOLE SODIUM 40 MG PO TBEC
40.0000 mg | DELAYED_RELEASE_TABLET | Freq: Every day | ORAL | Status: DC
  Administered 2020-12-03 – 2020-12-08 (×6): 40 mg via ORAL
  Filled 2020-12-01 (×6): qty 1

## 2020-12-01 MED ORDER — DEXMEDETOMIDINE HCL IN NACL 400 MCG/100ML IV SOLN
0.0000 ug/kg/h | INTRAVENOUS | Status: DC
Start: 2020-12-01 — End: 2020-12-02
  Administered 2020-12-01: 0.7 ug/kg/h via INTRAVENOUS

## 2020-12-01 MED ORDER — ASPIRIN EC 325 MG PO TBEC
325.0000 mg | DELAYED_RELEASE_TABLET | Freq: Every day | ORAL | Status: DC
  Administered 2020-12-02 – 2020-12-08 (×7): 325 mg via ORAL
  Filled 2020-12-01 (×7): qty 1

## 2020-12-01 MED ORDER — CHLORHEXIDINE GLUCONATE 0.12 % MT SOLN: 15.0000 mL | Freq: Once | OROMUCOSAL | Status: DC

## 2020-12-01 MED ORDER — DEXTROSE 50 % IV SOLN: 0.0000 mL | INTRAVENOUS | Status: DC | PRN

## 2020-12-01 MED ORDER — THROMBIN 20000 UNITS EX KIT
PACK | CUTANEOUS | Status: DC | PRN
  Administered 2020-12-01: 20000 [IU] via TOPICAL

## 2020-12-01 MED ORDER — CHLORHEXIDINE GLUCONATE CLOTH 2 % EX PADS
6.0000 | MEDICATED_PAD | Freq: Every day | CUTANEOUS | Status: DC
  Administered 2020-12-01 – 2020-12-02 (×2): 6 via TOPICAL

## 2020-12-01 MED ORDER — LIDOCAINE HCL (PF) 2 % IJ SOLN
INTRAMUSCULAR | Status: AC
  Filled 2020-12-01: qty 5

## 2020-12-01 MED ORDER — EPHEDRINE SULFATE-NACL 50-0.9 MG/10ML-% IV SOSY
PREFILLED_SYRINGE | INTRAVENOUS | Status: DC | PRN
  Administered 2020-12-01: 5 mg via INTRAVENOUS
  Administered 2020-12-01: 10 mg via INTRAVENOUS

## 2020-12-01 MED ORDER — MORPHINE SULFATE (PF) 2 MG/ML IV SOLN
1.0000 mg | INTRAVENOUS | Status: DC | PRN
  Administered 2020-12-01 (×2): 2 mg via INTRAVENOUS
  Administered 2020-12-01 (×2): 4 mg via INTRAVENOUS
  Administered 2020-12-02: 2 mg via INTRAVENOUS
  Filled 2020-12-01: qty 1
  Filled 2020-12-01: qty 2
  Filled 2020-12-01: qty 1
  Filled 2020-12-01: qty 2
  Filled 2020-12-01: qty 1

## 2020-12-01 MED ORDER — NITROGLYCERIN 0.2 MG/ML ON CALL CATH LAB
INTRAVENOUS | Status: DC | PRN
  Administered 2020-12-01: 40 ug via INTRAVENOUS
  Administered 2020-12-01: 20 ug via INTRAVENOUS
  Administered 2020-12-01 (×2): 40 ug via INTRAVENOUS
  Administered 2020-12-01 (×4): 20 ug via INTRAVENOUS
  Administered 2020-12-01: 40 ug via INTRAVENOUS

## 2020-12-01 MED ORDER — NITROGLYCERIN IN D5W 200-5 MCG/ML-% IV SOLN
0.0000 ug/min | INTRAVENOUS | Status: DC
  Administered 2020-12-02: 75 ug/min via INTRAVENOUS
  Administered 2020-12-02: 5 ug/min via INTRAVENOUS
  Administered 2020-12-03: 75 ug/min via INTRAVENOUS
  Filled 2020-12-01 (×3): qty 250

## 2020-12-01 MED ORDER — FENTANYL CITRATE (PF) 250 MCG/5ML IJ SOLN
INTRAMUSCULAR | Status: AC
  Filled 2020-12-01: qty 25

## 2020-12-01 MED ORDER — ROCURONIUM BROMIDE 10 MG/ML (PF) SYRINGE
PREFILLED_SYRINGE | INTRAVENOUS | Status: DC | PRN
  Administered 2020-12-01: 50 mg via INTRAVENOUS
  Administered 2020-12-01: 20 mg via INTRAVENOUS
  Administered 2020-12-01: 30 mg via INTRAVENOUS
  Administered 2020-12-01: 70 mg via INTRAVENOUS

## 2020-12-01 MED ORDER — SODIUM CHLORIDE 0.9 % IV SOLN: INTRAVENOUS | Status: DC | PRN

## 2020-12-01 MED ORDER — ALBUMIN HUMAN 5 % IV SOLN: INTRAVENOUS | Status: DC | PRN

## 2020-12-01 MED ORDER — TRAMADOL HCL 50 MG PO TABS
50.0000 mg | ORAL_TABLET | ORAL | Status: DC | PRN
  Administered 2020-12-01 – 2020-12-03 (×2): 100 mg via ORAL
  Filled 2020-12-01 (×2): qty 2

## 2020-12-01 MED ORDER — PHENYLEPHRINE HCL-NACL 20-0.9 MG/250ML-% IV SOLN
0.0000 ug/min | INTRAVENOUS | Status: DC
Start: 2020-12-01 — End: 2020-12-03

## 2020-12-01 MED ORDER — INSULIN REGULAR(HUMAN) IN NACL 100-0.9 UT/100ML-% IV SOLN
INTRAVENOUS | Status: DC
  Administered 2020-12-01: 1.5 [IU]/h via INTRAVENOUS

## 2020-12-01 MED ORDER — METOPROLOL TARTRATE 12.5 MG HALF TABLET: 12.5000 mg | ORAL_TABLET | Freq: Two times a day (BID) | ORAL | Status: DC

## 2020-12-01 MED ORDER — VANCOMYCIN HCL IN DEXTROSE 1-5 GM/200ML-% IV SOLN
1000.0000 mg | Freq: Once | INTRAVENOUS | Status: AC
  Administered 2020-12-01: 1000 mg via INTRAVENOUS
  Filled 2020-12-01: qty 200

## 2020-12-01 MED ORDER — SODIUM CHLORIDE 0.9 % IV SOLN: 250.0000 mL | INTRAVENOUS | Status: DC

## 2020-12-01 SURGICAL SUPPLY — 70 items
ADAPTER CARDIO PERF ANTE/RETRO (ADAPTER) ×3 IMPLANT
BAG DECANTER FOR FLEXI CONT (MISCELLANEOUS) ×3 IMPLANT
BLADE CLIPPER SURG (BLADE) ×3 IMPLANT
BLADE STERNUM SYSTEM 6 (BLADE) ×3 IMPLANT
BLADE SURG 15 STRL LF DISP TIS (BLADE) ×2 IMPLANT
BLADE SURG 15 STRL SS (BLADE) ×1
CANISTER SUCT 3000ML PPV (MISCELLANEOUS) ×3 IMPLANT
CANNULA ARTERIAL NVNT 3/8 24FR (CANNULA) ×3 IMPLANT
CANNULA GUNDRY RCSP 15FR (MISCELLANEOUS) ×3 IMPLANT
CATH HEART VENT LEFT (CATHETERS) ×2 IMPLANT
CATH ROBINSON RED A/P 18FR (CATHETERS) ×9 IMPLANT
CATH THORACIC 36FR (CATHETERS) ×3 IMPLANT
CATH THORACIC 36FR RT ANG (CATHETERS) ×3 IMPLANT
CNTNR URN SCR LID CUP LEK RST (MISCELLANEOUS) ×2 IMPLANT
CONT SPEC 4OZ STRL OR WHT (MISCELLANEOUS) ×1
CONTAINER PROTECT SURGISLUSH (MISCELLANEOUS) ×6 IMPLANT
COVER SURGICAL LIGHT HANDLE (MISCELLANEOUS) ×3 IMPLANT
DEVICE SUT CK QUICK LOAD INDV (Prosthesis & Implant Heart) ×3 IMPLANT
DEVICE SUT CK QUICK LOAD MINI (Prosthesis & Implant Heart) ×3 IMPLANT
DRAPE CARDIOVASCULAR INCISE (DRAPES) ×1
DRAPE SRG 135X102X78XABS (DRAPES) ×2 IMPLANT
DRAPE WARM FLUID 44X44 (DRAPES) ×3 IMPLANT
DRSG COVADERM 4X14 (GAUZE/BANDAGES/DRESSINGS) ×3 IMPLANT
ELECT CAUTERY BLADE 6.4 (BLADE) ×3 IMPLANT
ELECT REM PT RETURN 9FT ADLT (ELECTROSURGICAL) ×6
ELECTRODE REM PT RTRN 9FT ADLT (ELECTROSURGICAL) ×4 IMPLANT
FELT TEFLON 1X6 (MISCELLANEOUS) ×9 IMPLANT
GAUZE SPONGE 4X4 12PLY STRL (GAUZE/BANDAGES/DRESSINGS) ×3 IMPLANT
GLOVE SURG MICRO LTX SZ7 (GLOVE) ×6 IMPLANT
GOWN STRL REUS W/ TWL LRG LVL3 (GOWN DISPOSABLE) ×10 IMPLANT
GOWN STRL REUS W/ TWL XL LVL3 (GOWN DISPOSABLE) ×2 IMPLANT
GOWN STRL REUS W/TWL LRG LVL3 (GOWN DISPOSABLE) ×5
GOWN STRL REUS W/TWL XL LVL3 (GOWN DISPOSABLE) ×1
HEMOSTAT POWDER SURGIFOAM 1G (HEMOSTASIS) ×9 IMPLANT
HEMOSTAT SURGICEL 2X14 (HEMOSTASIS) ×3 IMPLANT
INSERT FOGARTY XLG (MISCELLANEOUS) ×3 IMPLANT
KIT BASIN OR (CUSTOM PROCEDURE TRAY) ×3 IMPLANT
KIT CATH CPB BARTLE (MISCELLANEOUS) ×3 IMPLANT
KIT SUCTION CATH 14FR (SUCTIONS) ×3 IMPLANT
KIT SUT CK MINI COMBO 4X17 (Prosthesis & Implant Heart) ×3 IMPLANT
KIT TURNOVER KIT B (KITS) ×3 IMPLANT
LINE VENT (MISCELLANEOUS) ×3 IMPLANT
NS IRRIG 1000ML POUR BTL (IV SOLUTION) ×15 IMPLANT
PACK E OPEN HEART (SUTURE) ×3 IMPLANT
PACK OPEN HEART (CUSTOM PROCEDURE TRAY) ×3 IMPLANT
PAD ARMBOARD 7.5X6 YLW CONV (MISCELLANEOUS) ×6 IMPLANT
POSITIONER HEAD DONUT 9IN (MISCELLANEOUS) ×3 IMPLANT
SET MPS 3-ND DEL (MISCELLANEOUS) ×3 IMPLANT
SUT BONE WAX W31G (SUTURE) ×3 IMPLANT
SUT EB EXC GRN/WHT 2-0 V-5 (SUTURE) ×6 IMPLANT
SUT ETHIBON EXCEL 2-0 V-5 (SUTURE) ×6 IMPLANT
SUT ETHIBOND V-5 VALVE (SUTURE) ×6 IMPLANT
SUT PROLENE 3 0 SH DA (SUTURE) ×3 IMPLANT
SUT PROLENE 3 0 SH1 36 (SUTURE) ×3 IMPLANT
SUT PROLENE 4 0 RB 1 (SUTURE) ×3
SUT PROLENE 4-0 RB1 .5 CRCL 36 (SUTURE) ×6 IMPLANT
SUT SILK 2 0 SH CR/8 (SUTURE) ×3 IMPLANT
SUT STEEL 6MS V (SUTURE) IMPLANT
SUT VIC AB 1 CTX 36 (SUTURE) ×3
SUT VIC AB 1 CTX36XBRD ANBCTR (SUTURE) ×6 IMPLANT
SYSTEM SAHARA CHEST DRAIN ATS (WOUND CARE) ×3 IMPLANT
TAPE CLOTH SURG 4X10 WHT LF (GAUZE/BANDAGES/DRESSINGS) ×3 IMPLANT
TAPE PAPER 2X10 WHT MICROPORE (GAUZE/BANDAGES/DRESSINGS) ×3 IMPLANT
TOWEL GREEN STERILE (TOWEL DISPOSABLE) ×3 IMPLANT
TOWEL GREEN STERILE FF (TOWEL DISPOSABLE) ×3 IMPLANT
TRAY FOLEY SLVR 16FR TEMP STAT (SET/KITS/TRAYS/PACK) ×3 IMPLANT
UNDERPAD 30X36 HEAVY ABSORB (UNDERPADS AND DIAPERS) ×3 IMPLANT
VALVE AORTIC SZ25 INSP/RESIL (Prosthesis & Implant Heart) ×3 IMPLANT
VENT LEFT HEART 12002 (CATHETERS) ×3
WATER STERILE IRR 1000ML POUR (IV SOLUTION) ×6 IMPLANT

## 2020-12-01 NOTE — Brief Op Note (Signed)
12/01/2020  8:51 AM  PATIENT:  Derek Washington  80 y.o. male  PRE-OPERATIVE DIAGNOSIS:  SEVERE AORTIC STENOSIS  POST-OPERATIVE DIAGNOSIS:  SEVERE AORTIC STENOSIS  PROCEDURE:  Procedure(s): AORTIC VALVE REPLACEMENT (AVR) USING INSPIRIS AORTIC VALVE (N/A) TRANSESOPHAGEAL ECHOCARDIOGRAM (TEE) (N/A)  SURGEON:  Surgeon(s) and Role:    * Bartle, Payton Doughty, MD - Primary  PHYSICIAN ASSISTANT:   Jari Favre, PA-C   ANESTHESIA:   general  EBL:  1266 mL   BLOOD ADMINISTERED:none  DRAINS:  ROUTINE    LOCAL MEDICATIONS USED:  NONE  SPECIMEN:  Source of Specimen:  AORTIC VALVE LEAFLETS  DISPOSITION OF SPECIMEN:  PATHOLOGY  COUNTS:  YES   DICTATION: .Dragon Dictation  PLAN OF CARE: Admit to inpatient   PATIENT DISPOSITION:  ICU - intubated and hemodynamically stable.   Delay start of Pharmacological VTE agent (>24hrs) due to surgical blood loss or risk of bleeding: yes

## 2020-12-01 NOTE — Interval H&P Note (Signed)
History and Physical Interval Note:  12/01/2020 7:12 AM  Derek Washington  has presented today for surgery, with the diagnosis of SEVERE AS.  The various methods of treatment have been discussed with the patient and family. After consideration of risks, benefits and other options for treatment, the patient has consented to  Procedure(s): AORTIC VALVE REPLACEMENT (AVR) (N/A) TRANSESOPHAGEAL ECHOCARDIOGRAM (TEE) (N/A) as a surgical intervention.  The patient's history has been reviewed, patient examined, no change in status, stable for surgery.  I have reviewed the patient's chart and labs.  Questions were answered to the patient's satisfaction.     [name]

## 2020-12-01 NOTE — Op Note (Signed)
CARDIOVASCULAR SURGERY OPERATIVE NOTE  12/01/2020 Derek Washington [id number]  Surgeon:  [name], MD  First Assistant: Jari Favre,  PA-C   Preoperative Diagnosis:  Severe bicuspid aortic valve stenosis   Postoperative Diagnosis:  Same   Procedure:  Median Sternotomy Extracorporeal circulation 3.   Aortic valve replacement using a 25 mm Edwards INSPIRIS RESILIA pericardial valve.  Anesthesia:  General Endotracheal   Clinical History/Surgical Indication:  This 80 year old gentleman has a bicuspid aortic valve with fusion of the left and right cusp and very heavy calcification of the valve with a calcium score of 5345.  There is significant calcification in the aortic annulus.  With a heavily calcified bicuspid aortic valve I think the best option for treatment is open surgical aortic valve replacement to prevent the likely complication of paravalvular leak.  He has mild dilatation of the ascending aorta but it is still below the threshold where I would replace his aorta especially in a 80 year old patient.  I discussed the alternative of transcatheter aortic valve replacement and my concerns about doing that in the setting of a heavily calcified bicuspid aortic valve.  He understands and is in agreement with proceeding with open surgery using a bioprosthetic aortic valve. I discussed the operative procedure with the patient and his wife including alternatives, benefits and risks; including but not limited to bleeding, blood transfusion, infection, stroke, myocardial infarction, graft failure, heart block requiring a permanent pacemaker, organ dysfunction, and death.  Derek Washington understands and agrees to proceed.    Preparation:  The patient was seen in the preoperative holding area and the correct patient, correct operation were confirmed with the patient after reviewing the medical record and catheterization. The consent was signed by me. Preoperative antibiotics were given. A  pulmonary arterial line and radial arterial line were placed by the anesthesia team. The patient was taken back to the operating room and positioned supine on the operating room table. After being placed under general endotracheal anesthesia by the anesthesia team a foley catheter was placed. The neck, chest, abdomen, and both legs were prepped with betadine soap and solution and draped in the usual sterile manner. A surgical time-out was taken and the correct patient and operative procedure were confirmed with the nursing and anesthesia staff.   Pre-bypass TEE:   Complete TEE assessment was performed by Dr. Jairo Ben. He has a Radiation protection practitioner type 1 Bicuspid valve that is severely calcified. There is fusion of the left and right cusps with complete immobility of the fused cusps. The non-coronary cusp is heavily calcified but still mobile. LV systolic function normal with mild LVH.    Post-bypass TEE:   Normal functioning prosthetic aortic valve with no perivalvular leak or regurgitation through the valve. Left ventricular function preserved. Trivial  mitral regurgitation.    Cardiopulmonary Bypass:  A median sternotomy was performed. The pericardium was opened in the midline. Right ventricular function appeared normal. The ascending aorta was of normal size and had no palpable plaque. There were no contraindications to aortic cannulation or cross-clamping. The patient was fully systemically heparinized and the ACT was maintained > 400 sec. The proximal aortic arch was cannulated with a 22 F aortic cannula for arterial inflow. Venous cannulation was performed via the right atrial appendage using a two-staged venous cannula. An antegrade cardioplegia/vent cannula was inserted into the mid-ascending aorta. A left ventricular vent was placed via the right superior pulmonary vein. A retrograde cardioplegia cannnula was placed into the coronary sinus via the right atrium.  Aortic occlusion was performed  with a single cross-clamp. Systemic cooling to 32 degrees Centigrade and topical cooling of the heart with iced saline were used. Hyperkalemic antegrade cold blood cardioplegia was used to induce diastolic arrest and then cold blood retrograde cardioplegia was given at about 20 minute intervals throughout the period of arrest to maintain myocardial temperature at or below 10 degrees centigrade. A temperature probe was inserted into the interventricular septum and an insulating pad was placed in the pericardium. Carbon dioxide was insufflated into the pericardium at 5L/min throughout the procedure to minimize intracardiac air.   Aortic Valve Replacement:   A transverse aortotomy was performed 1 cm above the take-off of the right coronary artery. The native valve was bicuspid with severely calcified leaflets and moderate annular calcification. There was a ring of calcification along the circumference of the non-coronary sinus about half way between the annulus and the STJ. The ostia of the coronary arteries were in normal position and were not obstructed. The native valve leaflets were excised and the annulus was decalcified with rongeurs. Care was taken to remove all particulate debris. The left ventricle was directly inspected for debris and then irrigated with ice saline solution. The annulus was sized and a size 25 mm Edwards INSPIRIS RESILIA pericardial valve was chosen. The model number was [id number] and the serial number was [id number]. While the valve was being prepared 2-0 Ethibond pledgeted horizontal mattress sutures were placed around the annulus with the pledgets in a sub-annular position. The sutures were placed through the sewing ring and the valve lowered into place. The sutures were tied sequentially. The valve seated nicely and the coronary ostia were not obstructed. The prosthetic valve leaflets moved normally and there was no sub-valvular obstruction. The aortotomy was closed using 4-0 Prolene  suture in 2 layers with felt strips to reinforce the closure.  Completion:  The patient was rewarmed to 37 degrees Centigrade. De-airing maneuvers were performed and the head placed in trendelenburg position. The crossclamp was removed with a time of 99 minutes. There was spontaneous return of sinus rhythm. The aortotomy was checked for hemostasis. Two temporary epicardial pacing wires were placed on the right atrium and two on the right ventricle. The left ventricular vent and retrograde cardioplegia cannulas were removed. The patient was weaned from CPB without difficulty on no inotropes. CPB time was 120 minutes. Cardiac output was 5 LPM. Heparin was fully reversed with protamine and the aortic and venous cannulas removed. Hemostasis was achieved. Mediastinal drainage tubes were placed. The sternum was closed with double #6 stainless steel wires. The fascia was closed with continuous # 1 vicryl suture. The subcutaneous tissue was closed with 2-0 vicryl continuous suture. The skin was closed with 3-0 vicryl subcuticular suture. All sponge, needle, and instrument counts were reported correct at the end of the case. Dry sterile dressings were placed over the incisions and around the chest tubes which were connected to pleurevac suction. The patient was then transported to the surgical intensive care unit in stable condition.

## 2020-12-01 NOTE — Progress Notes (Signed)
Patient ID: Stuart Mirabile, male   DOB: August 31, 1940, 80 y.o.   MRN: [id number]  TCTS Evening Rounds:   Hemodynamically stable  CI = 1.8 AV paced at 80.  Has started to wake up on vent. Weaning.  Urine output good  CT output low  CBC    Component Value Date/Time   WBC 11.0 (H) 12/01/2020 1330   RBC 3.19 (L) 12/01/2020 1330   HGB 10.2 (L) 12/01/2020 1330   HGB 13.6 10/31/2020 1238   HCT 30.8 (L) 12/01/2020 1330   HCT 40.6 10/31/2020 1238   PLT 107 (L) 12/01/2020 1330   PLT 193 10/31/2020 1238   MCV 96.6 12/01/2020 1330   MCV 96 10/31/2020 1238   MCH 32.0 12/01/2020 1330   MCHC 33.1 12/01/2020 1330   RDW 13.2 12/01/2020 1330   RDW 13.7 10/31/2020 1238     BMET    Component Value Date/Time   NA 142 12/01/2020 1153   NA 142 10/31/2020 1238   K 4.6 12/01/2020 1153   CL 107 12/01/2020 1149   CO2 20 (L) 11/29/2020 1057   GLUCOSE 132 (H) 12/01/2020 1149   BUN 23 12/01/2020 1149   BUN 19 10/31/2020 1238   CREATININE 0.80 12/01/2020 1149   CALCIUM 9.5 11/29/2020 1057   GFRNONAA >60 11/29/2020 1057   GFRAA 88 01/14/2020 0850     A/P:  Stable postop course. Continue current plans

## 2020-12-01 NOTE — Anesthesia Procedure Notes (Signed)
Central Venous Catheter Insertion Performed by: Atilano Median, DO, anesthesiologist Start/End6/16/2022 7:09 AM, 12/01/2020 7:10 AM Patient location: Pre-op. Preanesthetic checklist: patient identified, IV checked, site marked, risks and benefits discussed, surgical consent, monitors and equipment checked, pre-op evaluation, timeout performed and anesthesia consent Position: supine Hand hygiene performed  and maximum sterile barriers used  PA cath was placed.Swan type:thermodilution PA Cath depth:50 Procedure performed without using ultrasound guided technique. Attempts: 1 Patient tolerated the procedure well with no immediate complications.

## 2020-12-01 NOTE — Progress Notes (Signed)
  Echocardiogram Echocardiogram Transesophageal has been performed.  Gerda Diss 12/01/2020, 10:39 AM

## 2020-12-01 NOTE — Transfer of Care (Signed)
Immediate Anesthesia Transfer of Care Note  Patient: [name]  Procedure(s) Performed: AORTIC VALVE REPLACEMENT (AVR) USING INSPIRIS AORTIC VALVE (Chest) TRANSESOPHAGEAL ECHOCARDIOGRAM (TEE)  Patient Location: ICU  Anesthesia Type:General  Level of Consciousness: Patient remains intubated per anesthesia plan  Airway & Oxygen Therapy: Patient remains intubated per anesthesia plan and Patient placed on Ventilator (see vital sign flow sheet for setting)  Post-op Assessment: Report given to RN and Post -op Vital signs reviewed and stable  Post vital signs: Reviewed and stable  Last Vitals:  Vitals Value Taken Time  BP    Temp    Pulse 89 12/01/20 1313  Resp 21 12/01/20 1313  SpO2 97 % 12/01/20 1313  Vitals shown include unvalidated device data.  Last Pain:  Vitals:   12/01/20 0616  TempSrc:   PainSc: 0-No pain      Patients Stated Pain Goal: 2 (12/01/20 8546)  Complications: No notable events documented.

## 2020-12-01 NOTE — Discharge Summary (Signed)
[address]       [place] [address]             [phone]       Physician Discharge Summary  Patient ID: Derek [place] MRN: [id number] DOB/AGE: 79-Dec-1942 79 y.o.  Admit date: 12/01/2020 Discharge date: 12/08/2020  Admission Diagnoses:  Patient Active Problem List   Diagnosis Date Noted   Benign prostatic hyperplasia 11/16/2020   Chronic cough 11/16/2020   Chronic rhinitis 11/16/2020   Gastroesophageal reflux disease 11/16/2020   Hyperlipidemia 11/16/2020   Insomnia 11/16/2020   Lumbosacral spondylosis without myelopathy 11/16/2020   Hypertension, benign 11/16/2020   Vitamin D deficiency 11/16/2020   Bicuspid aortic valve 11/05/2018   Herpes simplex 09/24/2013   Near syncope 03/02/2013   IFG (impaired fasting glucose) 11/09/2011   Carotid bruit 03/09/2011   RBBB (right bundle branch block with left anterior fascicular block) 03/09/2011     Discharge Diagnoses:  Active Problems:   S/P AVR (aortic valve replacement)   Discharged Condition: good  HPI:   The patient is a 80 year old gentleman with history of hypertension, hyperlipidemia, and 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 Eldridge Dace.  Previous echocardiogram in July 2020 showed severe thickening of aortic valve with moderate calcification with trivial regurgitation.  The mean gradient across aortic valve was 33.7 mmHg with a peak gradient of 61.6 mmHg.  Stroke-volume index was 28.4.  Left ventricular ejection fraction was 55 to 60% with mild concentric LVH.  There is moderate dilatation of the ascending aorta measured at 45 mm on that echo.  He has had increasing exertional shortness of breath and fatigue as well as some chest pressure occasionally with walks.  He also reportedly had 2 episodes of falls where it is unclear if he had dizziness or loss consciousness.  A repeat echocardiogram on 10/27/2020 showed a bicuspid aortic valve with  severe calcification and thickening of the leaflets.  There is mild regurgitation.  AI pressure half-time is 595 ms.  The mean gradient across aortic valve was 36 mmHg with a peak gradient of 55 mmHg.  The aortic root was measured at 3.5 cm and the ascending aorta measured at 4.4 cm.  He underwent cardiac catheterization on 11/03/2020 which showed a 50% stenosis and a PLA branch.  There is otherwise minimal coronary disease.   He lives with his wife.  He is retired.  He is still very active and was going to the gym daily until recently.  His son-in-law is Dr. Margo Aye.  Hospital Course:   On 12/01/2020 Mr. Kenagy underwent a aortic valve replacement with Dr. Laneta Simmers. He tolerated the procedure well and was transferred to the surgical ICU for continued care. POD 1 he remained hemodynamically stable. No beta blocker since preop he was in heart block and immediately post-op he had high-grade AV block. His chest tubes and arterial line were removed today. We initiated diuresis for fluid overload.  Postop day 2 he was transferred to the cardiac telemetry unit for continued care.  He was still attached to his pacemaker with a backup rate of 50 bpm.  On postop day 4 he did have some rate controlled atrial fibrillation/atrial flutter with the occasional paced beat.  We consulted EP since he was still having the occasional second-degree AV block with bradycardia.  Postop day 6 he underwent a permanent pacemaker with EP and we removed his epicardial pacing wires after his procedure.  The following morning his pacemaker was interrogated and EP felt it was safe to go home.  Today he is ambulating with limited assistance, his incisions are healing well, he is on room air with good oxygenation and he is ready for discharge home.    Consults:  EP  Significant Diagnostic Studies:   ECHOCARDIOGRAM REPORT         Patient Name:   Derek [place] Date of Exam: 10/27/2020  Medical Rec #:  [id number]      Height:        72.0 in  Accession #:    [id number]     Weight:       239.4 lb  Date of Birth:  December 24, 1940      BSA:          2.300 m  Patient Age:    80 years       BP:           140/66 mmHg  Patient Gender: M              HR:           59 bpm.  Exam Location:  Outpatient   Procedure: 2D Echo   Indications:    Aortic Stenosis I35.0     History:        Patient has prior history of Echocardiogram examinations,  most                  recent 01/07/2019. Bicuspid AV; Risk Factors:Hypertension.     Sonographer:    Thurman Coyer RDCS (AE)  Referring Phys: [id number] JAYADEEP S VARANASI   IMPRESSIONS     1. Left ventricular ejection fraction, by estimation, is 55 to 60%. The  left ventricle has normal function. The left ventricle has no regional  wall motion abnormalities. There is mild concentric left ventricular  hypertrophy. Left ventricular diastolic  parameters are consistent with Grade I diastolic dysfunction (impaired  relaxation). Elevated left ventricular end-diastolic pressure.   2. Right ventricular systolic function is mildly reduced. The right  ventricular size is moderately enlarged. There is normal pulmonary artery  systolic pressure. The estimated right ventricular systolic pressure is  20.6 mmHg.   3. Left atrial size was severely dilated.   4. Right atrial size was mild to moderately dilated.   5. The mitral valve is normal in structure. Trivial mitral valve  regurgitation. No evidence of mitral stenosis.   6. The aortic valve is bicuspid. There is severe calcifcation of the  aortic valve. There is severe thickening of the aortic valve. Aortic valve  regurgitation is mild. Moderate aortic valve stenosis. Aortic  regurgitation PHT measures 595 msec. Aortic  valve mean gradient measures 36.0 mmHg. Aortic valve Vmax measures 3.71  m/s.   7. Aortic dilatation noted. There is moderate dilatation of the ascending  aorta, measuring 44 mm.   8. The inferior vena cava is normal in size with  greater than 50%  respiratory variability, suggesting right atrial pressure of 3 mmHg.   9. There appears to be moderate AS. The AVA is overestimated due to  overestimated LVOT diameter measurement. The dimnesionless index was 0.32.   FINDINGS   Left Ventricle: Left ventricular ejection fraction, by estimation, is 55  to 60%. The left ventricle has normal function. The left ventricle has no  regional wall motion abnormalities. The left ventricular internal cavity  size was normal in size. There is   mild concentric left ventricular hypertrophy. Left ventricular  diastolic  parameters are consistent with Grade I diastolic dysfunction (impaired  relaxation). Elevated left ventricular end-diastolic pressure.   Right Ventricle: The right ventricular size is moderately enlarged. No  increase in right ventricular wall thickness. Right ventricular systolic  function is mildly reduced. There is normal pulmonary artery systolic  pressure. The tricuspid regurgitant  velocity is 2.10 m/s, and with an assumed right atrial pressure of 3 mmHg,  the estimated right ventricular systolic pressure is 20.6 mmHg.   Left Atrium: Left atrial size was severely dilated.   Right Atrium: Right atrial size was mild to moderately dilated.   Pericardium: There is no evidence of pericardial effusion.   Mitral Valve: The mitral valve is normal in structure. Trivial mitral  valve regurgitation. No evidence of mitral valve stenosis.   Tricuspid Valve: The tricuspid valve is normal in structure. Tricuspid  valve regurgitation is mild . No evidence of tricuspid stenosis.   Aortic Valve: The aortic valve is bicuspid. There is severe calcifcation  of the aortic valve. There is severe thickening of the aortic valve.  Aortic valve regurgitation is mild. Aortic regurgitation PHT measures 595  msec. Moderate aortic stenosis is  present. Aortic valve mean gradient measures 36.0 mmHg. Aortic valve peak  gradient measures  55.1 mmHg. Aortic valve area, by VTI measures 1.70 cm.   Pulmonic Valve: The pulmonic valve was normal in structure. Pulmonic valve  regurgitation is trivial. No evidence of pulmonic stenosis.   Aorta: Aortic dilatation noted. There is moderate dilatation of the  ascending aorta, measuring 44 mm.   Venous: The inferior vena cava is normal in size with greater than 50%  respiratory variability, suggesting right atrial pressure of 3 mmHg.   IAS/Shunts: No atrial level shunt detected by color flow Doppler.      LEFT VENTRICLE  PLAX 2D  LVIDd:         5.20 cm  Diastology  LVIDs:         3.60 cm  LV e' medial:    5.11 cm/s  LV PW:         1.20 cm  LV E/e' medial:  12.6  LV IVS:        1.30 cm  LV e' lateral:   4.57 cm/s  LVOT diam:     2.60 cm  LV E/e' lateral: 14.1  LV SV:         164  LV SV Index:   71  LVOT Area:     5.31 cm      RIGHT VENTRICLE  RV S prime:     8.81 cm/s  TAPSE (M-mode): 2.6 cm   LEFT ATRIUM              Index       RIGHT ATRIUM           Index  LA diam:        4.40 cm  1.91 cm/m  RA Area:     25.10 cm  LA Vol (A2C):   148.0 ml 64.35 ml/m RA Volume:   84.80 ml  36.87 ml/m  LA Vol (A4C):   85.0 ml  36.96 ml/m  LA Biplane Vol: 122.0 ml 53.05 ml/m   AORTIC VALVE  AV Area (Vmax):    1.65 cm  AV Area (Vmean):   1.55 cm  AV Area (VTI):     1.70 cm  AV Vmax:           371.00 cm/s  AV Vmean:  288.000 cm/s  AV VTI:            0.962 m  AV Peak Grad:      55.1 mmHg  AV Mean Grad:      36.0 mmHg  LVOT Vmax:         115.00 cm/s  LVOT Vmean:        84.300 cm/s  LVOT VTI:          0.308 m  LVOT/AV VTI ratio: 0.32  AI PHT:            595 msec     AORTA  Ao Root diam: 3.50 cm   MITRAL VALVE                TRICUSPID VALVE  MV Area (PHT): 2.42 cm     TR Peak grad:   17.6 mmHg  MV Decel Time: 313 msec     TR Vmax:        210.00 cm/s  MV E velocity: 64.30 cm/s  MV A velocity: 102.00 cm/s  SHUNTS  MV E/A ratio:  0.63         Systemic VTI:  0.31  m                              Systemic Diam: 2.60 cm   Armanda Magic MD  Electronically signed by Armanda Magic MD  Signature Date/Time: 10/27/2020/9:27:54 PM      Treatments:   12/01/2020 [name] [id number]   Surgeon:  [name], MD   First Assistant: Jari Favre,  PA-C     Preoperative Diagnosis:  Severe bicuspid aortic valve stenosis     Postoperative Diagnosis:  Same     Procedure:   Median Sternotomy Extracorporeal circulation 3.   Aortic valve replacement using a 25 mm Edwards INSPIRIS RESILIA pericardial valve.   Anesthesia:  General Endotracheal     Clinical History/Surgical Indication:   This 80 year old gentleman has a bicuspid aortic valve with fusion of the left and right cusp and very heavy calcification of the valve with a calcium score of 5345.  There is significant calcification in the aortic annulus.  With a heavily calcified bicuspid aortic valve I think the best option for treatment is open surgical aortic valve replacement to prevent the likely complication of paravalvular leak.  He has mild dilatation of the ascending aorta but it is still below the threshold where I would replace his aorta especially in a 80 year old patient.  I discussed the alternative of transcatheter aortic valve replacement and my concerns about doing that in the setting of a heavily calcified bicuspid aortic valve.  He understands and is in agreement with proceeding with open surgery using a bioprosthetic aortic valve. I discussed the operative procedure with the patient and his wife including alternatives, benefits and risks; including but not limited to bleeding, blood transfusion, infection, stroke, myocardial infarction, graft failure, heart block requiring a permanent pacemaker, organ dysfunction, and death.  [name] understands and agrees to proceed.    Discharge Exam: Blood pressure (!) 149/82, pulse 78, temperature 97.9 F (36.6 C), temperature source Oral,  resp. rate 17, height 6' (1.829 m), weight 108 kg, SpO2 93 %.   General appearance: alert, cooperative, and no distress Heart: regular rate and rhythm Lungs: clear to auscultation bilaterally Abdomen: benign Extremities: trace edema Wound: incis healing well  Disposition: Discharge disposition: 01-Home or Self Care  Discharge Instructions     Amb Referral to Cardiac Rehabilitation   Complete by: As directed    Diagnosis: Valve Replacement   Valve: Aortic   After initial evaluation and assessments completed: Virtual Based Care may be provided alone or in conjunction with Phase 2 Cardiac Rehab based on patient barriers.: Yes      Allergies as of 12/08/2020       Reactions   Hydrochlorothiazide Other (See Comments)   fatigue        Medication List     TAKE these medications    amLODipine 10 MG tablet Commonly known as: NORVASC Take 10 mg by mouth every evening.   aspirin 325 MG EC tablet Take 1 tablet (325 mg total) by mouth daily.   atorvastatin 20 MG tablet Commonly known as: LIPITOR Take 20 mg by mouth every evening.   cholecalciferol 25 MCG (1000 UNIT) tablet Commonly known as: VITAMIN D Take 1,000 Units by mouth in the morning. Notes to patient: Take as you were prior to admission    finasteride 5 MG tablet Commonly known as: PROSCAR Take 5 mg by mouth in the morning.   Fish Oil 1000 MG Caps Take 1,000 mg by mouth 2 (two) times daily. Notes to patient: Take as you were prior to admission    multivitamin with minerals Tabs tablet Take 1 tablet by mouth in the morning. Notes to patient: Take as you were prior to admission    olmesartan 20 MG tablet Commonly known as: BENICAR Take 20 mg by mouth in the morning. Notes to patient: Take as you were prior to admission  Take tomorrow 12/09/20   traMADol 50 MG tablet Commonly known as: ULTRAM Take 1 tablet (50 mg total) by mouth every 6 (six) hours as needed for up to 7 days for moderate pain.         Follow-up Information     [name], MD. Call today.   Specialty: Internal Medicine Contact information: [address] [place] [address] [phone]         [name], MD .   Specialties: Cardiology, Radiology, Interventional Cardiology Why: Please see discharge paperwork for follow-up appointments with cardiology. Contact information: [address] [place] [address] [phone]         [name], MD Follow up.   Specialty: Cardiothoracic Surgery Why: Please see discharge paperwork for follow-up appointment with nurse for suture removal as well as appointment to see Dr. Laneta Simmers.  On the date you see Dr. Virgel Gess please obtain a chest x-ray at [facility] 1/2-hour prior to appointment.  It is located in the same office complex on the first floor. Contact information: [address] [place] [address] [phone]         [facility] [address] Office Follow up.   Specialty: Cardiology Why: 12/15/20 @ 3:20PM Contact information: [address], [address] [place] [address] [phone]        [name], MD .   Specialty: Cardiology Why: 03/21/21 @ 3:00PM Contact information: [address] [place] [address] [phone]         [facility] nursing appointment Follow up.   Why: Your suture removal appointment is on 6/28 at 10:30am Contact information: Dr. Sharee Pimple office                Signed: Sharlene Dory 12/08/2020, 12:36 PM

## 2020-12-01 NOTE — Anesthesia Procedure Notes (Signed)
Arterial Line Insertion Start/End6/16/2022 6:55 AM, 12/01/2020 7:00 AM  Patient location: Pre-op. Preanesthetic checklist: patient identified, IV checked, risks and benefits discussed, surgical consent, monitors and equipment checked, pre-op evaluation and anesthesia consent Lidocaine 1% used for infiltration and patient sedated Left, radial was placed Catheter size: 20 Fr Hand hygiene performed  and maximum sterile barriers used   Attempts: 1 Procedure performed without using ultrasound guided technique. Following insertion, dressing applied and Biopatch. Post procedure assessment: normal and unchanged  Patient tolerated the procedure well with no immediate complications.

## 2020-12-01 NOTE — Anesthesia Procedure Notes (Signed)
Central Venous Catheter Insertion Performed by: Atilano Median, DO, anesthesiologist Start/End6/16/2022 6:56 AM, 12/01/2020 7:08 AM Patient location: Pre-op. Preanesthetic checklist: patient identified, IV checked, site marked, risks and benefits discussed, surgical consent, monitors and equipment checked, pre-op evaluation, timeout performed and anesthesia consent Position: Trendelenburg Lidocaine 1% used for infiltration and patient sedated Hand hygiene performed  and maximum sterile barriers used  Catheter size: 8.5 Fr Central line was placed.Sheath introducer Procedure performed using ultrasound guided technique. Ultrasound Notes:anatomy identified, needle tip was noted to be adjacent to the nerve/plexus identified, no ultrasound evidence of intravascular and/or intraneural injection and image(s) printed for medical record Attempts: 1 Following insertion, line sutured, dressing applied and Biopatch. Post procedure assessment: blood return through all ports, free fluid flow and no air  Patient tolerated the procedure well with no immediate complications.

## 2020-12-01 NOTE — Discharge Instructions (Addendum)
Discharge Instructions:  1. You may shower, please wash incisions daily with soap and water and keep dry.  If you wish to cover wounds with dressing you may do so but please keep clean and change daily.  No tub baths or swimming until incisions have completely healed.  If your incisions become red or develop any drainage please call our office at [phone]  2. No Driving until cleared by Dr. Sharee Pimple office and you are no longer using narcotic pain medications  3. Monitor your weight daily.. Please use the same scale and weigh at same time... If you gain 5-10 lbs in 48 hours with associated lower extremity swelling, please contact our office at [phone]  4. Fever of 101.5 for at least 24 hours with no source, please contact our office at [phone]  5. Activity- up as tolerated, please walk at least 3 times per day.  Avoid strenuous activity, no lifting, pushing, or pulling with your arms over 8-10 lbs for a minimum of 6 weeks  6. If any questions or concerns arise, please do not hesitate to contact our office at [phone]        Supplemental Discharge Instructions for  Pacemaker/Defibrillator Patients    Activity No heavy lifting or vigorous activity with your left/right arm for 6 to 8 weeks.  Do not raise your left/right arm above your head for one week.  Gradually raise your affected arm as drawn below.           12/11/20                      12/12/20                    12/13/20                  12/14/20   __  NO DRIVING until cleared to by BOTH doctors  WOUND CARE Keep the wound area clean and dry.  Do not get this area wet , no showers for 24 hours, you may shower 12/08/20 evening Dr. Graciela Husbands used DERMABOND to close your wound.  DO NOT peel this off.  Do not rub/scrub the area, pat dry. If you notice any drainage or discharge from the wound, any swelling or bruising at the site, or you develop a fever > 101? F after you are discharged home, call the office at  once.  Special Instructions You are still able to use cellular telephones; use the ear opposite the side where you have your pacemaker/defibrillator.  Avoid carrying your cellular phone near your device. When traveling through airports, show security personnel your identification card to avoid being screened in the metal detectors.  Ask the security personnel to use the hand wand. Avoid arc welding equipment, MRI testing (magnetic resonance imaging), TENS units (transcutaneous nerve stimulators).  Call the office for questions about other devices. Avoid electrical appliances that are in poor condition or are not properly grounded. Microwave ovens are safe to be near or to operate.

## 2020-12-01 NOTE — Procedures (Signed)
Extubation Procedure Note  Patient Details:   Name: Derek [place] DOB: [birth year] MRN: [id number]   Airway Documentation:    Vent end date: 12/01/20 Vent end time: 1704   Evaluation  O2 sats: stable throughout Complications: No apparent complications Patient did tolerate procedure well. Bilateral Breath Sounds: Clear, Diminished   Yes  Positive cuff leak noted, NIF -27, VC 570, pt placed on [place] 4L w/ humidity no stridor noted. RN working with pt on incentive spirometry  [name] 12/01/2020, 5:12 PM

## 2020-12-01 NOTE — Anesthesia Procedure Notes (Addendum)
Procedure Name: Intubation Date/Time: 12/01/2020 8:07 AM Performed by: Jairo Ben, MD Pre-anesthesia Checklist: Patient identified, Emergency Drugs available, Suction available and Patient being monitored Patient Re-evaluated:Patient Re-evaluated prior to induction Oxygen Delivery Method: Circle System Utilized Preoxygenation: Pre-oxygenation with 100% oxygen Induction Type: IV induction Ventilation: Mask ventilation without difficulty Laryngoscope Size: 4 and Glidescope Grade View: Grade II Tube type: Oral Tube size: 8.0 mm Number of attempts: 3 Airway Equipment and Method: Video-laryngoscopy and Rigid stylet Placement Confirmation: ETT inserted through vocal cords under direct vision, positive ETCO2 and breath sounds checked- equal and bilateral Secured at: 23 cm Tube secured with: Tape Dental Injury: Teeth and Oropharynx as per pre-operative assessment  Future Recommendations: Recommend- induction with short-acting agent, and alternative techniques readily available Comments: Attempt by SRNA, CRNA, and MDA with Bougie. GlideScope utilized successfully.

## 2020-12-02 ENCOUNTER — Inpatient Hospital Stay (HOSPITAL_COMMUNITY): Payer: Medicare Other

## 2020-12-02 ENCOUNTER — Encounter (HOSPITAL_COMMUNITY): Payer: Self-pay | Admitting: Surgery

## 2020-12-02 LAB — BASIC METABOLIC PANEL
Anion gap: 7 (ref 5–15)
Anion gap: 9 (ref 5–15)
BUN: 15 mg/dL (ref 8–23)
BUN: 17 mg/dL (ref 8–23)
CO2: 22 mmol/L (ref 22–32)
CO2: 22 mmol/L (ref 22–32)
Calcium: 8.1 mg/dL — ABNORMAL LOW (ref 8.9–10.3)
Calcium: 8.1 mg/dL — ABNORMAL LOW (ref 8.9–10.3)
Chloride: 110 mmol/L (ref 98–111)
Chloride: 104 mmol/L (ref 98–111)
Creatinine, Ser: 1 mg/dL (ref 0.61–1.24)
Creatinine, Ser: 1.31 mg/dL — ABNORMAL HIGH (ref 0.61–1.24)
GFR, Estimated: >60 mL/min (ref >60)
GFR, Estimated: 55 mL/min — ABNORMAL LOW (ref >60)
Glucose, Bld: 126 mg/dL — ABNORMAL HIGH (ref 70–99)
Glucose, Bld: 195 mg/dL — ABNORMAL HIGH (ref 70–99)
Potassium: 3.9 mmol/L (ref 3.5–5.1)
Potassium: 3.8 mmol/L (ref 3.5–5.1)
Sodium: 139 mmol/L (ref 135–145)
Sodium: 135 mmol/L (ref 135–145)

## 2020-12-02 LAB — MAGNESIUM
Magnesium: 2.4 mg/dL (ref 1.7–2.4)
Magnesium: 2.1 mg/dL (ref 1.7–2.4)

## 2020-12-02 LAB — CBC
HCT: 31.3 % — ABNORMAL LOW (ref 39.0–52.0)
HCT: 31.6 % — ABNORMAL LOW (ref 39.0–52.0)
Hemoglobin: 10.7 g/dL — ABNORMAL LOW (ref 13.0–17.0)
Hemoglobin: 10.5 g/dL — ABNORMAL LOW (ref 13.0–17.0)
MCH: 32.3 pg (ref 26.0–34.0)
MCH: 32.4 pg (ref 26.0–34.0)
MCHC: 34.2 g/dL (ref 30.0–36.0)
MCHC: 33.2 g/dL (ref 30.0–36.0)
MCV: 94.6 fL (ref 80.0–100.0)
MCV: 97.5 fL (ref 80.0–100.0)
Platelets: 122 10*3/uL — ABNORMAL LOW (ref 150–400)
Platelets: 131 10*3/uL — ABNORMAL LOW (ref 150–400)
RBC: 3.31 MIL/uL — ABNORMAL LOW (ref 4.22–5.81)
RBC: 3.24 MIL/uL — ABNORMAL LOW (ref 4.22–5.81)
RDW: 13.8 % (ref 11.5–15.5)
RDW: 14.1 % (ref 11.5–15.5)
WBC: 13.4 10*3/uL — ABNORMAL HIGH (ref 4.0–10.5)
WBC: 14.9 10*3/uL — ABNORMAL HIGH (ref 4.0–10.5)
nRBC: 0 % (ref 0.0–0.2)
nRBC: 0 % (ref 0.0–0.2)

## 2020-12-02 LAB — GLUCOSE, CAPILLARY
Glucose-Capillary: 109 mg/dL — ABNORMAL HIGH (ref 70–99)
Glucose-Capillary: 120 mg/dL — ABNORMAL HIGH (ref 70–99)
Glucose-Capillary: 121 mg/dL — ABNORMAL HIGH (ref 70–99)
Glucose-Capillary: 124 mg/dL — ABNORMAL HIGH (ref 70–99)
Glucose-Capillary: 126 mg/dL — ABNORMAL HIGH (ref 70–99)
Glucose-Capillary: 131 mg/dL — ABNORMAL HIGH (ref 70–99)
Glucose-Capillary: 141 mg/dL — ABNORMAL HIGH (ref 70–99)
Glucose-Capillary: 142 mg/dL — ABNORMAL HIGH (ref 70–99)
Glucose-Capillary: 152 mg/dL — ABNORMAL HIGH (ref 70–99)
Glucose-Capillary: 157 mg/dL — ABNORMAL HIGH (ref 70–99)

## 2020-12-02 LAB — SURGICAL PATHOLOGY

## 2020-12-02 MED ORDER — INSULIN ASPART 100 UNIT/ML IJ SOLN
0.0000 [IU] | INTRAMUSCULAR | Status: DC
  Administered 2020-12-02 – 2020-12-03 (×6): 2 [IU] via SUBCUTANEOUS

## 2020-12-02 MED ORDER — FINASTERIDE 5 MG PO TABS
5.0000 mg | ORAL_TABLET | Freq: Every day | ORAL | Status: DC
  Administered 2020-12-02 – 2020-12-08 (×7): 5 mg via ORAL
  Filled 2020-12-02 (×7): qty 1

## 2020-12-02 MED ORDER — FUROSEMIDE 10 MG/ML IJ SOLN
40.0000 mg | Freq: Two times a day (BID) | INTRAMUSCULAR | Status: AC
  Administered 2020-12-02 (×2): 40 mg via INTRAVENOUS
  Filled 2020-12-02 (×2): qty 4

## 2020-12-02 MED ORDER — ENOXAPARIN SODIUM 40 MG/0.4ML IJ SOSY
40.0000 mg | PREFILLED_SYRINGE | Freq: Every day | INTRAMUSCULAR | Status: DC
  Administered 2020-12-02 – 2020-12-06 (×5): 40 mg via SUBCUTANEOUS
  Filled 2020-12-02 (×5): qty 0.4

## 2020-12-02 MED ORDER — POTASSIUM CHLORIDE CRYS ER 20 MEQ PO TBCR
20.0000 meq | EXTENDED_RELEASE_TABLET | Freq: Three times a day (TID) | ORAL | Status: AC
  Administered 2020-12-02 (×3): 20 meq via ORAL
  Filled 2020-12-02 (×3): qty 1

## 2020-12-02 MED ORDER — INSULIN ASPART 100 UNIT/ML IJ SOLN: 0.0000 [IU] | INTRAMUSCULAR | Status: DC

## 2020-12-02 MED ORDER — AMLODIPINE BESYLATE 10 MG PO TABS
10.0000 mg | ORAL_TABLET | Freq: Every day | ORAL | Status: DC
  Administered 2020-12-02 – 2020-12-08 (×7): 10 mg via ORAL
  Filled 2020-12-02 (×7): qty 1

## 2020-12-02 MED ORDER — IRBESARTAN 150 MG PO TABS
150.0000 mg | ORAL_TABLET | Freq: Every day | ORAL | Status: DC
  Administered 2020-12-02 – 2020-12-08 (×7): 150 mg via ORAL
  Filled 2020-12-02 (×7): qty 1

## 2020-12-02 MED FILL — Thrombin (Recombinant) For Soln 20000 Unit: CUTANEOUS | Qty: 1 | Status: AC

## 2020-12-02 MED FILL — Magnesium Sulfate Inj 50%: INTRAMUSCULAR | Qty: 10 | Status: AC

## 2020-12-02 MED FILL — Heparin Sodium (Porcine) Inj 1000 Unit/ML: INTRAMUSCULAR | Qty: 30 | Status: AC

## 2020-12-02 MED FILL — Potassium Chloride Inj 2 mEq/ML: INTRAVENOUS | Qty: 40 | Status: AC

## 2020-12-02 NOTE — Progress Notes (Signed)
1 Day Post-Op Procedure(s) (LRB): AORTIC VALVE REPLACEMENT (AVR) USING INSPIRIS AORTIC VALVE (N/A) TRANSESOPHAGEAL ECHOCARDIOGRAM (TEE) (N/A) Subjective: No complaints  Objective: Vital signs in last 24 hours: Temp:  [95.36 F (35.2 C)-99.9 F (37.7 C)] 99.1 F (37.3 C) (06/17 0645) Pulse Rate:  [69-109] 109 (06/17 0645) Cardiac Rhythm: A-V Sequential paced (06/17 0400) Resp:  [4-29] 14 (06/17 0645) BP: (86-163)/(23-93) 107/81 (06/17 0630) SpO2:  [95 %-100 %] 98 % (06/17 0645) Arterial Line BP: (91-170)/(46-80) 143/70 (06/17 0645) FiO2 (%):  [40 %-50 %] 40 % (06/16 1605) Weight:  [110.4 kg] 110.4 kg (06/17 0500)  Hemodynamic parameters for last 24 hours: PAP: (12-21)/(3-14) 19/11 CO:  [3.6 L/min-8.7 L/min] 6.6 L/min CI:  [1.6 L/min/m2-3.8 L/min/m2] 2.9 L/min/m2  Intake/Output from previous day: 06/16 0701 - 06/17 0700 In: 5518.2 [P.O.:30; I.V.:3168.3; Blood:675; IV Piggyback:1645] Out: 4216 [Urine:2560; Blood:1266; Chest Tube:390] Intake/Output this shift: No intake/output data recorded.  General appearance: alert and cooperative Neurologic: intact Heart: regular rate and rhythm, S1, S2 normal, no murmur Lungs: clear to auscultation bilaterally Extremities: edema mild Wound: dressings dry  Lab Results: Recent Labs    12/01/20 1917 12/02/20 0302  WBC 11.5* 13.4*  HGB 10.7* 10.7*  HCT 30.8* 31.3*  PLT 120* 122*   BMET:  Recent Labs    12/01/20 1917 12/02/20 0302  NA 139 139  K 4.1 3.9  CL 110 110  CO2 21* 22  GLUCOSE 119* 126*  BUN 16 15  CREATININE 0.94 1.00  CALCIUM 8.0* 8.1*    PT/INR:  Recent Labs    12/01/20 1330  LABPROT 18.0*  INR 1.5*   ABG    Component Value Date/Time   PHART 7.377 12/01/2020 1819   HCO3 19.8 (L) 12/01/2020 1819   TCO2 21 (L) 12/01/2020 1819   ACIDBASEDEF 5.0 (H) 12/01/2020 1819   O2SAT 98.0 12/01/2020 1819   CBG (last 3)  Recent Labs    12/02/20 0304 12/02/20 0424 12/02/20 0647  GLUCAP 124* 126* 120*    CXR: clear ECG: pending  Assessment/Plan: S/P Procedure(s) (LRB): AORTIC VALVE REPLACEMENT (AVR) USING INSPIRIS AORTIC VALVE (N/A) TRANSESOPHAGEAL ECHOCARDIOGRAM (TEE) (N/A)  POD 1 Hemodynamically stable but hypertensive, still on NTG. Will resume Norvasc this am. Resume olmesartan as BP allows. No beta blocker with baseline preop heart block on monitor and immediate postop high grade AV block.   Rhythm this am appears to be 1st degree AV block with RBBB which is same as preop. Will get ECG to confirm. Pacer changed to VVI backup at 50.   DC swan, arterial line and all chest tubes.  Volume excess: wt up 7 lbs from preop. Start diuresis.  Glucose under control on low dose insulin drip with no hx of DM and preop Hgb A1c of 5.5. DC drip and continue SSI today. If glucose stays under control can stop tomorrow.  OOB, IS, ambulate.  Discussed status with wife and daughter at bedside.   LOS: 1 day    [name] 12/02/2020

## 2020-12-02 NOTE — Progress Notes (Signed)
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

## 2020-12-02 NOTE — Anesthesia Postprocedure Evaluation (Addendum)
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 (Chest) TRANSESOPHAGEAL ECHOCARDIOGRAM (TEE)     Patient location during evaluation: SICU Anesthesia Type: General Level of consciousness: awake and alert, patient cooperative and oriented Pain management: pain level controlled Vital Signs Assessment: post-procedure vital signs reviewed and stable Respiratory status: nonlabored ventilation, spontaneous breathing, respiratory function stable and patient connected to nasal cannula oxygen Cardiovascular status: blood pressure returned to baseline and stable Postop Assessment: able to ambulate, adequate PO intake and no apparent nausea or vomiting Anesthetic complications: no Comments: Pt sitting up in chair, very pleased with his progress, says he feels great   No notable events documented.  Last Vitals:  Vitals:   12/02/20 1300 12/02/20 1315  BP: 130/88 (!) 148/78  Pulse: 75 73  Resp: 19 (!) 24  Temp:    SpO2: 95% 97%    Last Pain:  Vitals:   12/02/20 1200  TempSrc:   PainSc: 8                  Keston Seever,E. Jailyn Langhorst

## 2020-12-02 NOTE — Progress Notes (Signed)
Pt stood at bedside to weigh at this time. Pt tolerated well.

## 2020-12-03 ENCOUNTER — Inpatient Hospital Stay (HOSPITAL_COMMUNITY): Payer: Medicare Other

## 2020-12-03 LAB — GLUCOSE, CAPILLARY
Glucose-Capillary: 138 mg/dL — ABNORMAL HIGH (ref 70–99)
Glucose-Capillary: 108 mg/dL — ABNORMAL HIGH (ref 70–99)

## 2020-12-03 LAB — CBC
HCT: 29.2 % — ABNORMAL LOW (ref 39.0–52.0)
Hemoglobin: 9.8 g/dL — ABNORMAL LOW (ref 13.0–17.0)
MCH: 32.7 pg (ref 26.0–34.0)
MCHC: 33.6 g/dL (ref 30.0–36.0)
MCV: 97.3 fL (ref 80.0–100.0)
Platelets: DECREASED 10*3/uL (ref 150–400)
RBC: 3 MIL/uL — ABNORMAL LOW (ref 4.22–5.81)
RDW: 14.2 % (ref 11.5–15.5)
WBC: 13.7 10*3/uL — ABNORMAL HIGH (ref 4.0–10.5)
nRBC: 0 % (ref 0.0–0.2)

## 2020-12-03 LAB — BASIC METABOLIC PANEL
Anion gap: 6 (ref 5–15)
BUN: 19 mg/dL (ref 8–23)
CO2: 25 mmol/L (ref 22–32)
Calcium: 8.2 mg/dL — ABNORMAL LOW (ref 8.9–10.3)
Chloride: 105 mmol/L (ref 98–111)
Creatinine, Ser: 1.33 mg/dL — ABNORMAL HIGH (ref 0.61–1.24)
GFR, Estimated: 54 mL/min — ABNORMAL LOW (ref >60)
Glucose, Bld: 141 mg/dL — ABNORMAL HIGH (ref 70–99)
Potassium: 4.2 mmol/L (ref 3.5–5.1)
Sodium: 136 mmol/L (ref 135–145)

## 2020-12-03 MED ORDER — FUROSEMIDE 40 MG PO TABS
40.0000 mg | ORAL_TABLET | Freq: Two times a day (BID) | ORAL | Status: DC
  Administered 2020-12-03 – 2020-12-04 (×3): 40 mg via ORAL
  Filled 2020-12-03 (×3): qty 1

## 2020-12-03 MED ORDER — ~~LOC~~ CARDIAC SURGERY, PATIENT & FAMILY EDUCATION: Freq: Once | Status: AC

## 2020-12-03 MED ORDER — POTASSIUM CHLORIDE CRYS ER 20 MEQ PO TBCR
20.0000 meq | EXTENDED_RELEASE_TABLET | Freq: Two times a day (BID) | ORAL | Status: AC
  Administered 2020-12-03 – 2020-12-04 (×4): 20 meq via ORAL
  Filled 2020-12-03 (×4): qty 1

## 2020-12-03 NOTE — Progress Notes (Signed)
Mobility Specialist - Progress Note   12/03/20 1528  Mobility  Activity Ambulated in hall  Level of Assistance Contact guard assist, steadying assist  Assistive Device None  Distance Ambulated (ft) 450 ft  Mobility Ambulated with assistance in hallway  Mobility Response Tolerated well  Mobility performed by Mobility specialist  $Mobility charge 1 Mobility   Pt asx throughout ambulation. Pt back in bed after walk, call bell at side. VSS throughout.  Mamie Levers Mobility Specialist Mobility Specialist Phone: [phone]

## 2020-12-03 NOTE — Progress Notes (Addendum)
      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  Minimal soreness in chest.  No SOB.  Poor appetite  Objective: Vital signs: BP Readings from Last 1 Encounters:  12/03/20 104/82   Pulse Readings from Last 1 Encounters:  12/03/20 75   Resp Readings from Last 1 Encounters:  12/03/20 19   Temp Readings from Last 1 Encounters:  12/03/20 98.3 F (36.8 C) (Oral)    Hemodynamics:    Physical Exam:  Rhythm:   sinus  Breath sounds: clear  Heart sounds:  RRR  Incisions:  Dressings dry, intact  Abdomen:  Soft, non-distended, non-tender  Extremities:  Warm, well-perfused    Intake/Output from previous day: 06/17 0701 - 06/18 0700 In: 2057.5 [P.O.:360; I.V.:1297.4; IV Piggyback:400.1] Out: 1195 [Urine:1165; Chest Tube:30] Intake/Output this shift: Total I/O In: 42.5 [I.V.:42.5] Out: -   Lab Results:  CBC: Recent Labs    12/02/20 1551 12/03/20 0319  WBC 14.9* 13.7*  HGB 10.5* 9.8*  HCT 31.6* 29.2*  PLT 131* PLATELET CLUMPS NOTED ON SMEAR, COUNT APPEARS DECREASED    BMET:  Recent Labs    12/02/20 1551 12/03/20 0319  NA 135 136  K 3.8 4.2  CL 104 105  CO2 22 25  GLUCOSE 195* 141*  BUN 17 19  CREATININE 1.31* 1.33*  CALCIUM 8.1* 8.2*     PT/INR:   Recent Labs    12/01/20 1330  LABPROT 18.0*  INR 1.5*    CBG (last 3)  Recent Labs    12/02/20 2124 12/02/20 2346 12/03/20 0737  GLUCAP 109* 142* 138*    ABG    Component Value Date/Time   PHART 7.377 12/01/2020 1819   PCO2ART 33.9 12/01/2020 1819   PO2ART 112 (H) 12/01/2020 1819   HCO3 19.8 (L) 12/01/2020 1819   TCO2 21 (L) 12/01/2020 1819   ACIDBASEDEF 5.0 (H) 12/01/2020 1819   O2SAT 98.0 12/01/2020 1819    CXR: PORTABLE CHEST  1 VIEW   COMPARISON:  12/02/2020   FINDINGS: Pulmonary artery catheter has been removed. Right jugular central line is still present. Linear density at the left lung base is suggestive for atelectasis. Negative for pneumothorax. Heart and mediastinum are stable with median sternotomy wires and prosthetic aortic valve. Chest drains have been removed.   IMPRESSION: 1. Removal of chest tubes.  Negative for pneumothorax. 2. Left basilar atelectasis.     Electronically Signed   By: Richarda Overlie M.D.   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

## 2020-12-04 ENCOUNTER — Inpatient Hospital Stay (HOSPITAL_COMMUNITY): Payer: Medicare Other

## 2020-12-04 LAB — CBC
HCT: 28.8 % — ABNORMAL LOW (ref 39.0–52.0)
Hemoglobin: 9.6 g/dL — ABNORMAL LOW (ref 13.0–17.0)
MCH: 32.7 pg (ref 26.0–34.0)
MCHC: 33.3 g/dL (ref 30.0–36.0)
MCV: 98 fL (ref 80.0–100.0)
Platelets: 103 10*3/uL — ABNORMAL LOW (ref 150–400)
RBC: 2.94 MIL/uL — ABNORMAL LOW (ref 4.22–5.81)
RDW: 14.2 % (ref 11.5–15.5)
WBC: 9.7 10*3/uL (ref 4.0–10.5)
nRBC: 0 % (ref 0.0–0.2)

## 2020-12-04 LAB — BASIC METABOLIC PANEL
Anion gap: 6 (ref 5–15)
BUN: 19 mg/dL (ref 8–23)
CO2: 26 mmol/L (ref 22–32)
Calcium: 8.7 mg/dL — ABNORMAL LOW (ref 8.9–10.3)
Chloride: 103 mmol/L (ref 98–111)
Creatinine, Ser: 1.23 mg/dL (ref 0.61–1.24)
GFR, Estimated: 60 mL/min — ABNORMAL LOW (ref >60)
Glucose, Bld: 113 mg/dL — ABNORMAL HIGH (ref 70–99)
Potassium: 4.8 mmol/L (ref 3.5–5.1)
Sodium: 135 mmol/L (ref 135–145)

## 2020-12-04 NOTE — Progress Notes (Signed)
Mobility Specialist - Progress Note   12/04/20 1441  Mobility  Activity Ambulated in hall  Level of Assistance Standby assist, set-up cues, supervision of patient - no hands on  Assistive Device None  Distance Ambulated (ft) 470 ft  Mobility Ambulated with assistance in hallway  Mobility Response Tolerated well  Mobility performed by Mobility specialist  $Mobility charge 1 Mobility   Pt asx throughout ambulation. Pt to recliner after walk, call bell at side. VSS throughout.  Mamie Levers Mobility Specialist Mobility Specialist Phone: [phone]

## 2020-12-04 NOTE — Progress Notes (Addendum)
      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 No complaints.   Objective: Vital signs in last 24 hours: Temp:  [98.1 F (36.7 C)-98.6 F (37 C)] 98.1 F (36.7 C) (06/19 0320) Pulse Rate:  [70-92] 75 (06/18 2348) Cardiac Rhythm: Atrial fibrillation (06/19 0746) Resp:  [10-23] 16 (06/19 0320) BP: (98-143)/(60-90) 113/75 (06/19 0320) SpO2:  [90 %-96 %] 92 % (06/19 0320) Weight:  [111.7 kg] 111.7 kg (06/19 0328)     Intake/Output from previous day: 06/18 0701 - 06/19 0700 In: 376.3 [P.O.:240; I.V.:136.3] Out: 415 [Urine:415] Intake/Output this shift: No intake/output data recorded.  General appearance: alert, cooperative, and no distress Heart: regular rate and rhythm, S1, S2 normal, no murmur, click, rub or gallop Lungs: clear to auscultation bilaterally Abdomen: soft, non-tender; bowel sounds normal; no masses,  no organomegaly Extremities: extremities normal, atraumatic, no cyanosis or edema Wound: clean and dry  Lab Results: Recent Labs    12/03/20 0319 12/04/20 0128  WBC 13.7* 9.7  HGB 9.8* 9.6*  HCT 29.2* 28.8*  PLT PLATELET CLUMPS NOTED ON SMEAR, COUNT APPEARS DECREASED 103*   BMET:  Recent Labs    12/03/20 0319 12/04/20 0128  NA 136 135  K 4.2 4.8  CL 105 103  CO2 25 26  GLUCOSE 141* 113*  BUN 19 19  CREATININE 1.33* 1.23  CALCIUM 8.2* 8.7*    PT/INR:  Recent Labs    12/01/20 1330  LABPROT 18.0*  INR 1.5*   ABG    Component Value Date/Time   PHART 7.377 12/01/2020 1819   HCO3 19.8 (L) 12/01/2020 1819   TCO2 21 (L) 12/01/2020 1819   ACIDBASEDEF 5.0 (H) 12/01/2020 1819   O2SAT 98.0 12/01/2020 1819   CBG (last 3)  Recent Labs    12/02/20 2346 12/03/20 0737 12/03/20 1125  GLUCAP 142* 138* 108*     Assessment/Plan: S/P Procedure(s) (LRB): 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 Continue asa/statin/Avapro/Norvasc. AV block so holding BB Pulm- tolerating room air with good oxygen saturation Renal-creatinine 1.23, electrolytes okay.  H and H 9.6/28.8, expected acute blood loss anemia Endo- blood glucose well controlled  Plan: Feels good this morning. Walked in the halls 3x yesterday. Will obtain an EKG this morning. He is on a backup rate of 50 on the pacer. Keep wires today.    LOS: 3 days    Sharlene Dory 12/04/2020   I have seen and examined the patient and agree with the assessment and plan as outlined.  Making good progress.  Possibly home soon.  Purcell Nails, MD 12/04/2020 12:37 PM

## 2020-12-04 NOTE — Progress Notes (Signed)
PA on call notified about EKG results showing ST elevation and lateral injury. Pt resting comfortably in bed with no distress. No new orders given. Will continue to monitor.   Shellsea Borunda M

## 2020-12-04 NOTE — Plan of Care (Signed)
  Problem: Education: Goal: Will demonstrate proper wound care and an understanding of methods to prevent future damage Outcome: Progressing Goal: Knowledge of disease or condition will improve Outcome: Progressing Goal: Knowledge of the prescribed therapeutic regimen will improve Outcome: Progressing Goal: Individualized Educational Video(s) Outcome: Progressing   

## 2020-12-05 MED ORDER — POTASSIUM CHLORIDE CRYS ER 20 MEQ PO TBCR
20.0000 meq | EXTENDED_RELEASE_TABLET | Freq: Every day | ORAL | Status: DC
  Administered 2020-12-05 – 2020-12-07 (×3): 20 meq via ORAL
  Filled 2020-12-05 (×3): qty 1

## 2020-12-05 MED ORDER — FUROSEMIDE 40 MG PO TABS
40.0000 mg | ORAL_TABLET | Freq: Every day | ORAL | Status: DC
  Administered 2020-12-05 – 2020-12-07 (×3): 40 mg via ORAL
  Filled 2020-12-05 (×3): qty 1

## 2020-12-05 NOTE — Progress Notes (Signed)
Mobility Specialist - Progress Note   12/05/20 1518  Mobility  Activity Ambulated in hall  Level of Assistance Standby assist, set-up cues, supervision of patient - no hands on  Assistive Device None  Distance Ambulated (ft) 470 ft  Mobility Ambulated with assistance in hallway  Mobility Response Tolerated well  Mobility performed by Mobility specialist;Family member  $Mobility charge 1 Mobility   Accompanied pt and family member as they were leaving the room to ambulate. He was asx throughout. Pt left in room w/ family member present. VSS.    Mamie Levers Mobility Specialist Mobility Specialist Phone: [phone]

## 2020-12-05 NOTE — Progress Notes (Signed)
CARDIAC REHAB PHASE I   PRE:  Rate/Rhythm: 73 SR  BP:  Supine:   Sitting: 146/82  Standing:    SaO2: 97%RA  MODE:  Ambulation: 470 ft   POST:  Rate/Rhythm: 92 SR  BP:  Supine:   Sitting: 149/80  Standing:    SaO2: 96%RA 0915-1010 Pt walked 470 ft on RA with hand held asst. Slightly SOB but sats good. Encouraged IS and more walks today. To recliner with call bell. Wife in room. Discussed CRP 2 and referral to [place] done. Answered some questions re activity at home.   [name], RN BSN  12/05/2020 10:09 AM

## 2020-12-05 NOTE — Progress Notes (Signed)
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 Intake/Output this shift: No intake/output data recorded.  General appearance: alert, cooperative, and no distress Heart: regular rate and rhythm and soft systolic murmur Lungs: clear to auscultation bilaterally Abdomen: benign Extremities: min edema Wound: incis healing well  Lab Results: Recent Labs    12/03/20 0319 12/04/20 0128  WBC 13.7* 9.7  HGB 9.8* 9.6*  HCT 29.2* 28.8*  PLT PLATELET CLUMPS NOTED ON SMEAR, COUNT APPEARS DECREASED 103*   BMET:  Recent Labs    12/03/20 0319 12/04/20 0128  NA 136 135  K 4.2 4.8  CL 105 103  CO2 25 26  GLUCOSE 141* 113*  BUN 19 19  CREATININE 1.33* 1.23  CALCIUM 8.2* 8.7*    PT/INR: No results for input(s): LABPROT, INR in the last 72 hours. ABG    Component Value Date/Time   PHART 7.377 12/01/2020 1819   HCO3 19.8 (L) 12/01/2020 1819   TCO2 21 (L) 12/01/2020 1819   ACIDBASEDEF 5.0 (H) 12/01/2020 1819   O2SAT 98.0 12/01/2020 1819   CBG (last 3)  Recent Labs    12/02/20 2346 12/03/20 0737 12/03/20 1125  GLUCAP 142* 138* 108*    Meds Scheduled Meds:  acetaminophen  1,000 mg Oral Q6H   amLODipine  10 mg Oral Daily   aspirin EC  325 mg Oral Daily    atorvastatin  20 mg Oral QPM   bisacodyl  10 mg Oral Daily   Or   bisacodyl  10 mg Rectal Daily   docusate sodium  200 mg Oral Daily   enoxaparin (LOVENOX) injection  40 mg Subcutaneous QHS   finasteride  5 mg Oral Daily   furosemide  40 mg Oral BID   irbesartan  150 mg Oral Daily   mouth rinse  15 mL Mouth Rinse BID   pantoprazole  40 mg Oral Daily   sodium chloride flush  3 mL Intravenous Q12H   Continuous Infusions:  sodium chloride     lactated ringers     PRN Meds:.ondansetron (ZOFRAN) IV, oxyCODONE, sodium chloride flush, traMADol  Xrays DG Chest 2 View  Result Date: 12/04/2020 CLINICAL DATA:  Atelectasis. EXAM: CHEST - 2 VIEW COMPARISON:  12/03/2020 FINDINGS: Aortic valve replacement with cardiac enlargement. Negative for heart failure. Small pleural effusion. Mild left lower lobe atelectasis improved. Right lung clear IMPRESSION: Left lower lobe atelectasis shows mild interval improvement. Small left effusion unchanged. Negative for edema. Electronically Signed   By: Marlan Palau M.D.   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 Will turn pacer off and cont to observe. Will arrange for Zio patch. Will plan eliquis at d/c short term. 2 sats good on RA 3 UOP not accurate, weight up about 3 kg- reduce lasix to q day 4 no new labs or CXR 5 cont rehab and pulm toilet   LOS: 4 days    Rowe Clack PA-C Pager [phone] 12/05/2020

## 2020-12-05 NOTE — TOC Benefit Eligibility Note (Signed)
Transition of Care [facility]) Benefit Eligibility Note    Patient Details  Name: Derek Washington MRN: [id number] Date of Birth: Aug 01, 1940   Medication/Dose: Everlene Balls  5 MG BID  Covered?: Yes  Tier: 3 Drug  Prescription Coverage Preferred Pharmacy: [facility] with Person/Company/Phone Number:: QUI  @  [employer] #  [phone]  Co-Pay: $ 47.00     Deductible:  (NO DEDUCTIBLE WITH PLAN  and  OUT-OF-POCKET: Secundino Ginger Phone Number: 12/05/2020, 1:58 PM

## 2020-12-06 DIAGNOSIS — I441 Atrioventricular block, second degree: Secondary | ICD-10-CM

## 2020-12-06 LAB — POCT I-STAT, CHEM 8
BUN: 9 mg/dL (ref 8–23)
Calcium, Ion: 0.62 mmol/L — CL (ref 1.15–1.40)
Chloride: 113 mmol/L — ABNORMAL HIGH (ref 98–111)
Creatinine, Ser: 0.3 mg/dL — ABNORMAL LOW (ref 0.61–1.24)
Glucose, Bld: 75 mg/dL (ref 70–99)
HCT: 18 % — ABNORMAL LOW (ref 39.0–52.0)
Hemoglobin: 6.1 g/dL — CL (ref 13.0–17.0)
Potassium: 2.2 mmol/L — CL (ref 3.5–5.1)
Sodium: 144 mmol/L (ref 135–145)
TCO2: 14 mmol/L — ABNORMAL LOW (ref 22–32)

## 2020-12-06 LAB — BASIC METABOLIC PANEL
Anion gap: 8 (ref 5–15)
BUN: 18 mg/dL (ref 8–23)
CO2: 26 mmol/L (ref 22–32)
Calcium: 8.8 mg/dL — ABNORMAL LOW (ref 8.9–10.3)
Chloride: 104 mmol/L (ref 98–111)
Creatinine, Ser: 1.13 mg/dL (ref 0.61–1.24)
GFR, Estimated: >60 mL/min (ref >60)
Glucose, Bld: 92 mg/dL (ref 70–99)
Potassium: 4.1 mmol/L (ref 3.5–5.1)
Sodium: 138 mmol/L (ref 135–145)

## 2020-12-06 MED FILL — Sodium Chloride IV Soln 0.9%: INTRAVENOUS | Qty: 4000 | Status: AC

## 2020-12-06 MED FILL — Lidocaine HCl Local Soln Prefilled Syringe 100 MG/5ML (2%): INTRAMUSCULAR | Qty: 5 | Status: AC

## 2020-12-06 MED FILL — Sodium Bicarbonate IV Soln 8.4%: INTRAVENOUS | Qty: 50 | Status: AC

## 2020-12-06 MED FILL — Mannitol IV Soln 20%: INTRAVENOUS | Qty: 500 | Status: AC

## 2020-12-06 MED FILL — Heparin Sodium (Porcine) Inj 1000 Unit/ML: INTRAMUSCULAR | Qty: 10 | Status: AC

## 2020-12-06 MED FILL — Electrolyte-R (PH 7.4) Solution: INTRAVENOUS | Qty: 3000 | Status: AC

## 2020-12-06 NOTE — Progress Notes (Signed)
Mobility Specialist - Progress Note   12/06/20 1420  Mobility  Activity Ambulated in hall  Level of Assistance Independent  Assistive Device None  Distance Ambulated (ft) 470 ft  Mobility Ambulated with assistance in hallway  Mobility Response Tolerated well  Mobility performed by Mobility specialist  $Mobility charge 1 Mobility   Pt asx throughout ambulation. Pt to recliner after walk, call bell at side. VSS throughout.  Mamie Levers Mobility Specialist Mobility Specialist Phone: [phone]

## 2020-12-06 NOTE — Progress Notes (Addendum)
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 (N/A) TRANSESOPHAGEAL ECHOCARDIOGRAM (TEE) (N/A) Subjective: Conts to feel well, no c/o  Objective: Vital signs in last 24 hours: Temp:  [97.5 F (36.4 C)-98.4 F (36.9 C)] 97.5 F (36.4 C) (06/21 0726) Pulse Rate:  [76-85] 81 (06/21 0726) Cardiac Rhythm: Heart block (06/20 1900) Resp:  [13-20] 13 (06/21 0726) BP: (108-153)/(81-91) 153/81 (06/21 0726) SpO2:  [94 %-97 %] 95 % (06/21 0726) Weight:  [107.7 kg] 107.7 kg (06/21 0402)  Hemodynamic parameters for last 24 hours:    Intake/Output from previous day: 06/20 0701 - 06/21 0700 In: 480 [P.O.:480] Out: -  Intake/Output this shift: Total I/O In: 240 [P.O.:240] Out: -   General appearance: alert, cooperative, and no distress Heart: regular rate and rhythm and 2/6 sys murmur Lungs: min dim in bases Abdomen: benign Extremities: trace edema Wound: incis healing well  Lab Results: Recent Labs    12/04/20 0128  WBC 9.7  HGB 9.6*  HCT 28.8*  PLT 103*   BMET:  Recent Labs    12/04/20 0128 12/06/20 0119  NA 135 138  K 4.8 4.1  CL 103 104  CO2 26 26  GLUCOSE 113* 92  BUN 19 18  CREATININE 1.23 1.13  CALCIUM 8.7* 8.8*    PT/INR: No results for input(s): LABPROT, INR in the last 72 hours. ABG    Component Value Date/Time   PHART 7.377 12/01/2020 1819   HCO3 19.8 (L) 12/01/2020 1819   TCO2 21 (L) 12/01/2020 1819   ACIDBASEDEF 5.0 (H) 12/01/2020 1819   O2SAT 98.0 12/01/2020 1819   CBG (last 3)  Recent Labs    12/03/20 0737 12/03/20 1125  GLUCAP 138* 108*    Meds Scheduled Meds:  acetaminophen  1,000 mg Oral Q6H   amLODipine  10 mg Oral Daily   aspirin EC  325 mg Oral Daily   atorvastatin  20 mg Oral QPM   bisacodyl  10 mg Oral Daily   Or   bisacodyl  10 mg Rectal Daily   docusate sodium  200 mg Oral  Daily   enoxaparin (LOVENOX) injection  40 mg Subcutaneous QHS   finasteride  5 mg Oral Daily   furosemide  40 mg Oral Daily   irbesartan  150 mg Oral Daily   mouth rinse  15 mL Mouth Rinse BID   pantoprazole  40 mg Oral Daily   potassium chloride  20 mEq Oral Daily   sodium chloride flush  3 mL Intravenous Q12H   Continuous Infusions:  sodium chloride     lactated ringers     PRN Meds:.ondansetron (ZOFRAN) IV, oxyCODONE, sodium chloride flush, traMADol  Xrays No results found.  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 Short episode of afib 2 sats good on RA 3 renal fxn normal 4 BS well controlled 5 will d/w MD rhythm issues prior to discharge decision  LOS: 5 days    Rowe Clack PA-C Pager [phone] 12/06/2020    Chart reviewed, patient examined, agree with above. He feels well and looks good overall. 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 He has been asymptomatic. Will ask EP to consult before sending home.

## 2020-12-06 NOTE — Progress Notes (Signed)
1033 Pt sleeping. Wife stated he has walked this morning. Will return and educate later when awake. [name] RN BSN 12/06/2020 10:34 AM

## 2020-12-06 NOTE — Consult Note (Addendum)
Cardiology Consultation:   Patient ID: Derek Washington MRN: [id number]; DOB: Dec 30, 1940  Admit date: 12/01/2020 Date of Consult: 12/06/2020  PCP:  [name], MD   [facility] Providers Cardiologist:  Lance Muss, MD   {   Patient Profile:f   Derek Washington is a 80 y.o. male with a hx of HTN, HLD, bicuspid AV/AS and AI who is being seen 12/06/2020 for the evaluation of intermittent heart block at the request of Dr. Laneta Simmers.  History of Present Illness:   Derek Washington with a hx of falls vs faint of late and workup revelealed NOD by cath and found to have severe AS, referredto Dr. Laneta Simmers and planned for AVR.  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 Fells like he could go home   Past Medical History:  Diagnosis Date   Bicuspid aortic valve    LAST ECHOCARDIOGRAM APPROXIMATELY 2016? MILD AORTIC REGURG   BPH (benign prostatic hyperplasia)    Hard of hearing    Heart murmur    High cholesterol    Hydrocele    Hypertension    Insomnia    Low back pain    RIGHT   Mild obesity    Nocturia    Right elbow tendonitis    Tinnitus     Past Surgical History:  Procedure Laterality Date   AORTIC VALVE REPLACEMENT N/A 12/01/2020   Procedure: AORTIC VALVE REPLACEMENT  (AVR) USING INSPIRIS AORTIC VALVE;  Surgeon: [name], MD;  Location: [facility] OR;  Service: Open Heart Surgery;  Laterality: N/A;   HYDROCELE EXCISION  2017   DR. [name] 2017   JOINT REPLACEMENT     left total knee replacement      2007    right knee total replacement      2011   RIGHT/LEFT HEART CATH AND CORONARY ANGIOGRAPHY N/A 11/03/2020   Procedure: RIGHT/LEFT HEART CATH AND CORONARY ANGIOGRAPHY;  Surgeon: [name], MD;  Location: [facility] INVASIVE CV LAB;  Service: Cardiovascular;  Laterality: N/A;   SCROTAL EXPLORATION N/A 12/26/2015   Procedure: SCROTUM EXPLORATION;  Surgeon: [name], MD;  Location: [facility] ORS;  Service: Urology;  Laterality: N/A;  EXCISION OF LEFT EPIDIDYMAL CYST/SPERMATOCELE   TEE WITHOUT CARDIOVERSION N/A 12/01/2020   Procedure: TRANSESOPHAGEAL ECHOCARDIOGRAM (TEE);  Surgeon: [name], MD;  Location: [facility] OR;  Service: Open Heart Surgery;  Laterality: N/A;   TONSILLECTOMY     VASECTOMY     1968     Home Medications:  Prior to Admission medications   Medication Sig Start Date End Date Taking? Authorizing Provider  amLODipine (NORVASC) 10 MG tablet Take 10 mg by mouth every evening.   Yes [provider]  atorvastatin (LIPITOR) 20 MG tablet Take 20 mg  by mouth every evening. 11/15/15  Yes [provider]  cholecalciferol (VITAMIN D) 25 MCG (1000 UNIT) tablet Take 1,000 Units by mouth in the morning.   Yes [provider]  finasteride (PROSCAR) 5 MG tablet Take 5 mg by mouth in the morning. 11/15/15  Yes [provider]  Multiple Vitamin (MULTIVITAMIN WITH MINERALS) TABS tablet Take 1 tablet by mouth in the morning.   Yes [provider]  olmesartan (BENICAR) 20 MG tablet Take 20 mg by mouth in the morning.   Yes [provider]  Omega-3 Fatty Acids (FISH OIL) 1000 MG CAPS Take 1,000 mg by mouth 2 (two) times daily.   Yes [provider]    Inpatient Medications: Scheduled  Meds:  acetaminophen  1,000 mg Oral Q6H   amLODipine  10 mg Oral Daily   aspirin EC  325 mg Oral Daily   atorvastatin  20 mg Oral QPM   bisacodyl  10 mg Oral Daily   Or   bisacodyl  10 mg Rectal Daily   docusate sodium  200 mg Oral Daily   enoxaparin (LOVENOX) injection  40 mg Subcutaneous QHS   finasteride  5 mg Oral Daily   furosemide  40 mg Oral Daily   irbesartan  150 mg Oral Daily   mouth rinse  15 mL Mouth Rinse BID   pantoprazole  40 mg Oral Daily   potassium chloride  20 mEq Oral Daily   sodium chloride flush  3 mL Intravenous Q12H   Continuous Infusions:  sodium chloride     lactated ringers     PRN Meds: ondansetron (ZOFRAN) IV, oxyCODONE, sodium chloride flush, traMADol  Allergies:    Allergies  Allergen Reactions   Hydrochlorothiazide Other (See Comments)    fatigue    Social History:   Social History   Socioeconomic History   Marital status: Married    Spouse name: Not on file   Number of children: 1   Years of education: Not on file   Highest education level: Not on file  Occupational History   Occupation: RETIRED  Tobacco Use   Smoking status: Never   Smokeless tobacco: Never  Vaping Use   Vaping Use: Never used  Substance and Sexual Activity   Alcohol use: Yes    Comment: occas    Drug use: No   Sexual activity: Not on file  Other Topics Concern   Not on file  Social History Narrative   Not on file   Social Determinants of Health   Financial Resource Strain: Not on file  Food Insecurity: Not on file  Transportation Needs: Not on file  Physical Activity: Not on file  Stress: Not on file  Social Connections: Not on file  Intimate Partner Violence: Not on file    Family History:   Family History  Problem Relation Age of Onset   Other Mother 64       HEAVY SMOKER   COPD Father 25       HEAVY SMOKER   Other Sister 8       HEAVY SMOKER     ROS:  Please see the history of present illness.  All other ROS reviewed and negative.      Physical Exam/Data:   Vitals:   12/05/20 1948 12/05/20 2329 12/06/20 0402 12/06/20 0726  BP: 118/89 (!) 145/81 (!) 147/87 (!) 153/81  Pulse: 78 76 85 81  Resp: 15 17 20 13   Temp: 98.2 F (36.8 C) 98 F (  36.7 C) 98.4 F (36.9 C) (!) 97.5 F (36.4 C)  TempSrc: Oral Oral Oral Oral  SpO2: 97% 96% 94% 95%  Weight:   107.7 kg   Height:        Intake/Output Summary (Last 24 hours) at 12/06/2020 1144 Last data filed at 12/06/2020 0727 Gross per 24 hour  Intake 240 ml  Output --  Net 240 ml   Last 3 Weights 12/06/2020 12/05/2020 12/04/2020  Weight (lbs) 237 lb 6.4 oz 243 lb 6.4 oz 246 lb 3.2 oz  Weight (kg) 107.684 kg 110.406 kg 111.676 kg     Body mass index is 32.2 kg/m.  General:  Well nourished, well developed, in no acute distress HEENT: normal Lymph: no adenopathy Neck: no JVD Endocrine:  No thryomegaly Vascular: No carotid bruits; FA pulses 2+ bilaterally without bruits  Cardiac:   RRR; 2/6 SM, no gallops or  Lungs:  CTA b/l, no wheezing, rhonchi or rales  Abd: soft, nontender, no hepatomegaly  Ext: no edema Musculoskeletal:  No deformities Skin: warm and dry  Neuro:  CNs 2-12 intact, no focal abnormalities noted Psych:  Normal affect   EKG:  The EKG was personally reviewed and demonstrates:    #1 is SR 75bpm, RBBB, 1st degree AVblock, PR 272ms, LAD #2 atypical aflutter 61bpm, RBBB  OLD 10/31/20 SR 60bpm, RBBB, 1st degree Avblock PR 272ms, LAD  Telemetry:  Telemetry was personally reviewed and demonstrates SR 60's-70's, rare episodes of heart block noted, occur during waking hours though as well with transient rates 30's 2-5 seconds in duration of 2:1 AVblock   Relevant CV Studies:  12/01/20" inter-op TEE POST-OP IMPRESSIONS  Limited post-CPB exam: The patient separated easily from CPB.  - Left Ventricle: The left ventricular function is unchanged from  pre-bypass  images. Contractility is vigorous, with no wall motion abnormalities.  - Right Ventricle:  The right ventricular function appears unchanged from  pre-bypass images.  - Aortic Valve: A pericardial bioprosthetic valve was placed, leaflets are  freely mobile, with peak gradient 12 mmHg, mean gradient 7 mmHg. No  regurgitation post repair. No perivalvular leak noted. The valve is  functioning  well.  - Mitral Valve: The mitral valve function appears unchanged from  pre-bypass  images.  - Tricuspid Valve: The tricuspid valve function appears unchanged from  pre-bypass images.   PRE-OP FINDINGS   Left Ventricle: The left ventricle has normal systolic function, with an  ejection fraction of 60-65%, measured 62.6%. The cavity size was mildly  dilated. No evidence of left ventricular regional wall motion  abnormalities. There is no left ventricular  hypertrophy. Left ventricular diastolic function was not evaluated.   Right Ventricle: The right ventricle has normal systolic function. The  cavity was normal. There is no increase in right ventricular wall  thickness.   Left Atrium: Left atrial size was dilated. No left atrial/left atrial  appendage thrombus was detected. Left atrial appendage velocity is normal  at greater than 40 cm/s.   Right Atrium: Right atrial size was dilated.   Interatrial Septum: No atrial level shunt detected by color flow Doppler.   Pericardium: Trivial pericardial effusion is present. The pericardial  effusion is circumferential.   Mitral Valve: The mitral valve is normal in structure. Mitral valve  regurgitation is trivial by color flow Doppler. There is no evidence of  mitral valve vegetation. There is no evidence of mitral stenosis, with  peak grad 2 mmHg, mean grad 1 mmHg.   Tricuspid Valve: The tricuspid  valve was normal in structure. Tricuspid  valve regurgitation is trivial by color flow Doppler. No evidence of  tricuspid stenosis is present. There is no evidence of tricuspid valve  vegetation.   Aortic Valve: The aotic annulus is  calcified. The aortic valve is  bicuspid, with fusion of the Left and Right coronary cusps. All leaflet  tips are thickened. The Left and Right fused leaflet is non-mobile. Aortic  valve regurgitation is mild by color flow   Doppler. The jet is eccentric, and posteriorly directed. There is  moderate stenosis of the aortic valve, with peak gradient 55 mmHg, and  mean gradient 34 mmHg, although the functioning Non-coronary leaftet moves  well. There is no evidence of aortic  valve vegetation.   Pulmonic Valve: The pulmonic valve was normal in structure, with normal  leaflet mobility. No evidence of pulmonic stenosis. Pulmonic valve  regurgitation is trivial, around the PA catheter, by color flow Doppler.    Aorta: There is scattered, Grade 1, plaque throughout the descending  aorta. The ascending arota is calcified.     Pulmonary Artery: The pulmonary artery is of normal size.   Venous: The inferior vena cava is normal in size with greater than 50%  respiratory variability, suggesting right atrial pressure of 3 mmHg.    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  I suspect his symptoms are related to his bicuspid aortic valve stenosis/regurgitation which will be addressed by Dr. Eldridge Dace as an outpatient.  The sheath was removed and a TR band was placed on the right wrist to achieve patent hemostasis.  The patient left lab in stable condition.   10/27/20: TTE IMPRESSIONS   1. Left ventricular ejection fraction, by estimation, is 55 to 60%. The  left ventricle has normal function. The left ventricle has no regional  wall motion abnormalities. There is mild concentric left ventricular  hypertrophy. Left ventricular diastolic  parameters are consistent with Grade I diastolic dysfunction (impaired  relaxation). Elevated left ventricular end-diastolic pressure.   2. Right ventricular systolic function is mildly reduced. The right   ventricular size is moderately enlarged. There is normal pulmonary artery  systolic pressure. The estimated right ventricular systolic pressure is  20.6 mmHg.   3. Left atrial size was severely dilated.   4. Right atrial size was mild to moderately dilated.   5. The mitral valve is normal in structure. Trivial mitral valve  regurgitation. No evidence of mitral stenosis.   6. The aortic valve is bicuspid. There is severe calcifcation of the  aortic valve. There is severe thickening of the aortic valve. Aortic valve  regurgitation is mild. Moderate aortic valve stenosis. Aortic  regurgitation PHT measures 595 msec. Aortic  valve mean gradient measures 36.0 mmHg. Aortic valve Vmax measures 3.71  m/s.   7. Aortic dilatation noted. There is moderate dilatation of the ascending  aorta, measuring 44 mm.   8. The inferior vena cava is normal in size with greater than 50%  respiratory variability, suggesting right atrial pressure of 3 mmHg.   9. There appears to be moderate AS. The AVA is overestimated due to  overestimated LVOT diameter measurement. The dimnesionless index was 0.32.   Laboratory Data:  High Sensitivity Troponin:  No results for input(s): TROPONINIHS in the last 720 hours.   Chemistry Recent Labs  Lab 12/03/20 0319 12/04/20 0128 12/06/20 0119  NA 136 135 138  K 4.2 4.8 4.1  CL  105 103 104  CO2 25 26 26   GLUCOSE 141* 113* 92  BUN 19 19 18   CREATININE 1.33* 1.23 1.13  CALCIUM 8.2* 8.7* 8.8*  GFRNONAA 54* 60* >60  ANIONGAP 6 6 8     No results for input(s): PROT, ALBUMIN, AST, ALT, ALKPHOS, BILITOT in the last 168 hours. Hematology Recent Labs  Lab 12/02/20 1551 12/03/20 0319 12/04/20 0128  WBC 14.9* 13.7* 9.7  RBC 3.24* 3.00* 2.94*  HGB 10.5* 9.8* 9.6*  HCT 31.6* 29.2* 28.8*  MCV 97.5 97.3 98.0  MCH 32.4 32.7 32.7  MCHC 33.2 33.6 33.3  RDW 14.1 14.2 14.2  PLT 131* PLATELET CLUMPS NOTED ON SMEAR, COUNT APPEARS DECREASED 103*   BNPNo results for input(s):  BNP, PROBNP in the last 168 hours.  DDimer No results for input(s): DDIMER in the last 168 hours.   Radiology/Studies:  DG Chest 2 View  Result Date: 12/04/2020 CLINICAL DATA:  Atelectasis. EXAM: CHEST - 2 VIEW COMPARISON:  12/03/2020 FINDINGS: Aortic valve replacement with cardiac enlargement. Negative for heart failure. Small pleural effusion. Mild left lower lobe atelectasis improved. Right lung clear IMPRESSION: Left lower lobe atelectasis shows mild interval improvement. Small left effusion unchanged. Negative for edema. Electronically Signed   By: 12/06/20 M.D.   On: 12/04/2020 11:55   DG Chest Port 1 View  Result Date: 12/03/2020 CLINICAL DATA:  Status post aortic valve replacement. EXAM: PORTABLE CHEST 1 VIEW COMPARISON:  12/02/2020 FINDINGS: Pulmonary artery catheter has been removed. Right jugular central line is still present. Linear density at the left lung base is suggestive for atelectasis. Negative for pneumothorax. Heart and mediastinum are stable with median sternotomy wires and prosthetic aortic valve. Chest drains have been removed. IMPRESSION: 1. Removal of chest tubes.  Negative for pneumothorax. 2. Left basilar atelectasis. Electronically Signed   By: 12/06/2020 M.D.   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  He has had No symptoms of  dizziness, near syncope or syncope here.  He feels quite well and would like to go home, though if needed of course would stay.  I lean towards pacing given his significant baseline conduction system disease I will review with Dr. 12/04/2020 and he will see him later today.   ADDEND Dr. Richarda Overlie has seen and examined the patient Recommends PPM Will hold NPO at [facility] tonight Implanting EP MD for tomorrow will see in the morning   Risk Assessment/Risk Scores:  {  For questions or updates, please contact [facility] Please consult [email or web address] for contact info under    Signed, 12/05/2020, PA-C  12/06/2020 11:44 AM   I have seen, examined the patient, and reviewed the above assessment and plan.  Changes to above are made where necessary.  On exam, RRR.   The patient has advanced conduction system disease with recent syncope.  Now with  second degree AV block observed.  I think it may be best to proceed with PPM implantation.  I have discussed at length with the patient and spouse who are willing to proceed.  No reversible causes are found.  Dr Graciela Husbands to see in AM.  Co Sign: Hillis Range, MD 12/06/2020 9:37 PM

## 2020-12-06 NOTE — Care Management Important Message (Signed)
Important Message  Patient Details  Name: Derek Washington MRN: [id number] Date of Birth: 1940-12-22   Medicare Important Message Given:  Yes     Oralia Rud Shavonne Ambroise 12/06/2020, 3:16 PM

## 2020-12-06 NOTE — Progress Notes (Signed)
[id number] Pt has walked twice today with wife. Education completed with pt and wife who voiced understanding. Stressed importance of sternal precautions, IS and gave walking instructions. Referred to [place] CRP 2. Gave heart healthy diet. Wrote down how to view d/c video. [name] RN BSN 12/06/2020 1:24 PM

## 2020-12-07 ENCOUNTER — Other Ambulatory Visit: Payer: Self-pay | Admitting: Student

## 2020-12-07 ENCOUNTER — Inpatient Hospital Stay (HOSPITAL_COMMUNITY)
Admission: RE | Disposition: A | Discharge status: Home / Self Care | Payer: Self-pay | Source: Home / Self Care | Attending: Surgery

## 2020-12-07 DIAGNOSIS — I442 Atrioventricular block, complete: Secondary | ICD-10-CM

## 2020-12-07 DIAGNOSIS — I350 Nonrheumatic aortic (valve) stenosis: Secondary | ICD-10-CM

## 2020-12-07 DIAGNOSIS — Z952 Presence of prosthetic heart valve: Secondary | ICD-10-CM

## 2020-12-07 HISTORY — PX: PACEMAKER IMPLANT: EP1218

## 2020-12-07 SURGERY — PACEMAKER IMPLANT

## 2020-12-07 MED ORDER — SODIUM CHLORIDE 0.9 % IV SOLN
INTRAVENOUS | Status: DC
Start: 2020-12-07 — End: 2020-12-07

## 2020-12-07 MED ORDER — LIDOCAINE HCL 1 % IJ SOLN
INTRAMUSCULAR | Status: AC
  Filled 2020-12-07: qty 20

## 2020-12-07 MED ORDER — CHLORHEXIDINE GLUCONATE 4 % EX LIQD
60.0000 mL | Freq: Once | CUTANEOUS | Status: AC
  Administered 2020-12-07: 4 via TOPICAL
  Filled 2020-12-07: qty 15

## 2020-12-07 MED ORDER — CHLORHEXIDINE GLUCONATE 4 % EX LIQD
60.0000 mL | Freq: Once | CUTANEOUS | Status: AC
  Filled 2020-12-07: qty 15

## 2020-12-07 MED ORDER — ACETAMINOPHEN 325 MG PO TABS
325.0000 mg | ORAL_TABLET | ORAL | Status: DC | PRN
Start: 2020-12-07 — End: 2020-12-08

## 2020-12-07 MED ORDER — FENTANYL CITRATE (PF) 100 MCG/2ML IJ SOLN
INTRAMUSCULAR | Status: AC
  Filled 2020-12-07: qty 2

## 2020-12-07 MED ORDER — FENTANYL CITRATE (PF) 100 MCG/2ML IJ SOLN
INTRAMUSCULAR | Status: DC | PRN
  Administered 2020-12-07 (×4): 25 ug via INTRAVENOUS

## 2020-12-07 MED ORDER — HEPARIN (PORCINE) IN NACL 1000-0.9 UT/500ML-% IV SOLN
INTRAVENOUS | Status: DC | PRN
  Administered 2020-12-07: 500 mL

## 2020-12-07 MED ORDER — CEFAZOLIN SODIUM-DEXTROSE 2-4 GM/100ML-% IV SOLN
2.0000 g | INTRAVENOUS | Status: AC
  Administered 2020-12-07: 2 g via INTRAVENOUS
  Filled 2020-12-07: qty 100

## 2020-12-07 MED ORDER — MIDAZOLAM HCL 5 MG/5ML IJ SOLN
INTRAMUSCULAR | Status: DC | PRN
  Administered 2020-12-07 (×4): 1 mg via INTRAVENOUS

## 2020-12-07 MED ORDER — CEFAZOLIN SODIUM-DEXTROSE 1-4 GM/50ML-% IV SOLN
1.0000 g | Freq: Four times a day (QID) | INTRAVENOUS | Status: AC
  Administered 2020-12-07 – 2020-12-08 (×3): 1 g via INTRAVENOUS
  Filled 2020-12-07 (×3): qty 50

## 2020-12-07 MED ORDER — ONDANSETRON HCL 4 MG/2ML IJ SOLN: 4.0000 mg | Freq: Four times a day (QID) | INTRAMUSCULAR | Status: DC | PRN

## 2020-12-07 MED ORDER — MIDAZOLAM HCL 5 MG/5ML IJ SOLN
INTRAMUSCULAR | Status: AC
  Filled 2020-12-07: qty 5

## 2020-12-07 MED ORDER — SODIUM CHLORIDE 0.9 % IV SOLN
80.0000 mg | INTRAVENOUS | Status: AC
  Administered 2020-12-07: 80 mg
  Filled 2020-12-07: qty 2

## 2020-12-07 MED ORDER — SODIUM CHLORIDE 0.9 % IV SOLN: INTRAVENOUS | Status: DC

## 2020-12-07 MED ORDER — CEFAZOLIN SODIUM-DEXTROSE 2-4 GM/100ML-% IV SOLN
INTRAVENOUS | Status: AC
  Filled 2020-12-07: qty 100

## 2020-12-07 MED ORDER — SODIUM CHLORIDE 0.9 % IV SOLN
INTRAVENOUS | Status: AC
  Filled 2020-12-07: qty 2

## 2020-12-07 MED ORDER — LIDOCAINE HCL (PF) 1 % IJ SOLN
INTRAMUSCULAR | Status: DC | PRN
  Administered 2020-12-07: 50 mL

## 2020-12-07 MED ORDER — HEPARIN (PORCINE) IN NACL 1000-0.9 UT/500ML-% IV SOLN
INTRAVENOUS | Status: AC
  Filled 2020-12-07: qty 500

## 2020-12-07 SURGICAL SUPPLY — 12 items
CABLE SURGICAL S-101-97-12 (CABLE) ×2 IMPLANT
CATH RIGHTSITE C315HIS02 (CATHETERS) ×2 IMPLANT
HEMOSTAT SURGICEL 2X4 FIBR (HEMOSTASIS) ×2 IMPLANT
IPG PACE AZUR XT DR MRI W1DR01 (Pacemaker) ×1 IMPLANT
LEAD CAPSURE NOVUS 5076-52CM (Lead) ×2 IMPLANT
LEAD SELECT SECURE 3830 383069 (Lead) ×1 IMPLANT
PACE AZURE XT DR MRI W1DR01 (Pacemaker) ×2 IMPLANT
PAD PRO RADIOLUCENT 2001M-C (PAD) ×2 IMPLANT
SELECT SECURE 3830 383069 (Lead) ×2 IMPLANT
SHEATH 7FR PRELUDE SNAP 13 (SHEATH) ×4 IMPLANT
TRAY PACEMAKER INSERTION (PACKS) ×2 IMPLANT
WIRE HI TORQ VERSACORE-J 145CM (WIRE) ×2 IMPLANT

## 2020-12-07 NOTE — Progress Notes (Signed)
[id number] Pt getting ready to bathe and has walked once today. For PPM today. Discussed with pt the importance of keeping arm of pacemaker side close to body. Discussed that he will be advised as when he can move arm in stages to ensure lead placement. Will follow up tomorrow. [name] RN BSN 12/07/2020 11:07 AM

## 2020-12-07 NOTE — Progress Notes (Addendum)
Progress Note  Patient Name: Derek Washington Date of Encounter: 12/07/2020  [facility] Cardiologist: Lance Muss, MD   Subjective   Feels well  Inpatient Medications    Scheduled Meds:  amLODipine  10 mg Oral Daily   aspirin EC  325 mg Oral Daily   atorvastatin  20 mg Oral QPM   bisacodyl  10 mg Oral Daily   Or   bisacodyl  10 mg Rectal Daily   docusate sodium  200 mg Oral Daily   finasteride  5 mg Oral Daily   furosemide  40 mg Oral Daily   irbesartan  150 mg Oral Daily   mouth rinse  15 mL Mouth Rinse BID   pantoprazole  40 mg Oral Daily   potassium chloride  20 mEq Oral Daily   sodium chloride flush  3 mL Intravenous Q12H   Continuous Infusions:  sodium chloride     lactated ringers     PRN Meds: ondansetron (ZOFRAN) IV, oxyCODONE, sodium chloride flush, traMADol   Vital Signs    Vitals:   12/06/20 1943 12/07/20 0401 12/07/20 0555 12/07/20 0800  BP: 139/84 140/82  (!) 145/73  Pulse:  74  78  Resp: 15 17  14   Temp: 98.1 F (36.7 C) 98 F (36.7 C)  98 F (36.7 C)  TempSrc: Oral Oral  Oral  SpO2: 96% 93%  94%  Weight:   106.8 kg   Height:       No intake or output data in the 24 hours ending 12/07/20 0849 Last 3 Weights 12/07/2020 12/06/2020 12/05/2020  Weight (lbs) 235 lb 6.4 oz 237 lb 6.4 oz 243 lb 6.4 oz  Weight (kg) 106.777 kg 107.684 kg 110.406 kg      Telemetry    Sinus with some conduction block 2:1   ECG    No new EKGs - Personally Reviewed  Physical Exam   GEN: No acute distress.   Neck: No JVD Cardiac: RRR,1-2/6SM, no rubs, or gallops.  Respiratory: CTA b/l. GI: Soft, nontender, non-distended  MS: No edema; No deformity. Neuro:  Nonfocal  Psych: Normal affect   Labs    High Sensitivity Troponin:  No results for input(s): TROPONINIHS in the last 720 hours.    Chemistry Recent Labs  Lab 12/03/20 0319 12/04/20 0128 12/06/20 0119  NA 136 135 138  K 4.2 4.8 4.1  CL 105 103 104  CO2 25 26 26   GLUCOSE 141* 113* 92   BUN 19 19 18   CREATININE 1.33* 1.23 1.13  CALCIUM 8.2* 8.7* 8.8*  GFRNONAA 54* 60* >60  ANIONGAP 6 6 8      Hematology Recent Labs  Lab 12/02/20 1551 12/03/20 0319 12/04/20 0128  WBC 14.9* 13.7* 9.7  RBC 3.24* 3.00* 2.94*  HGB 10.5* 9.8* 9.6*  HCT 31.6* 29.2* 28.8*  MCV 97.5 97.3 98.0  MCH 32.4 32.7 32.7  MCHC 33.2 33.6 33.3  RDW 14.1 14.2 14.2  PLT 131* PLATELET CLUMPS NOTED ON SMEAR, COUNT APPEARS DECREASED 103*    BNPNo results for input(s): BNP, PROBNP in the last 168 hours.   DDimer No results for input(s): DDIMER in the last 168 hours.   Radiology    No results found.  Cardiac Studies   12/01/20" inter-op TEE POST-OP IMPRESSIONS  Limited post-CPB exam: The patient separated easily from CPB.  - Left Ventricle: The left ventricular function is unchanged from  pre-bypass  images. Contractility is vigorous, with no wall motion abnormalities.  - Right Ventricle: The right  ventricular function appears unchanged from  pre-bypass images.  - Aortic Valve: A pericardial bioprosthetic valve was placed, leaflets are  freely mobile, with peak gradient 12 mmHg, mean gradient 7 mmHg. No  regurgitation post repair. No perivalvular leak noted. The valve is  functioning  well.  - Mitral Valve: The mitral valve function appears unchanged from  pre-bypass  images.  - Tricuspid Valve: The tricuspid valve function appears unchanged from  pre-bypass images.   PRE-OP FINDINGS   Left Ventricle: The left ventricle has normal systolic function, with an  ejection fraction of 60-65%, measured 62.6%. The cavity size was mildly  dilated. No evidence of left ventricular regional wall motion  abnormalities. There is no left ventricular  hypertrophy. Left ventricular diastolic function was not evaluated.   Right Ventricle: The right ventricle has normal systolic function. The  cavity was normal. There is no increase in right ventricular wall  thickness.   Left Atrium: Left atrial  size was dilated. No left atrial/left atrial  appendage thrombus was detected. Left atrial appendage velocity is normal  at greater than 40 cm/s.   Right Atrium: Right atrial size was dilated.   Interatrial Septum: No atrial level shunt detected by color flow Doppler.   Pericardium: Trivial pericardial effusion is present. The pericardial  effusion is circumferential.   Mitral Valve: The mitral valve is normal in structure. Mitral valve  regurgitation is trivial by color flow Doppler. There is no evidence of  mitral valve vegetation. There is no evidence of mitral stenosis, with  peak grad 2 mmHg, mean grad 1 mmHg.   Tricuspid Valve: The tricuspid valve was normal in structure. Tricuspid  valve regurgitation is trivial by color flow Doppler. No evidence of  tricuspid stenosis is present. There is no evidence of tricuspid valve  vegetation.   Aortic Valve: The aotic annulus is calcified. The aortic valve is  bicuspid, with fusion of the Left and Right coronary cusps. All leaflet  tips are thickened. The Left and Right fused leaflet is non-mobile. Aortic  valve regurgitation is mild by color flow   Doppler. The jet is eccentric, and posteriorly directed. There is  moderate stenosis of the aortic valve, with peak gradient 55 mmHg, and  mean gradient 34 mmHg, although the functioning Non-coronary leaftet moves  well. There is no evidence of aortic  valve vegetation.   Pulmonic Valve: The pulmonic valve was normal in structure, with normal  leaflet mobility. No evidence of pulmonic stenosis. Pulmonic valve  regurgitation is trivial, around the PA catheter, by color flow Doppler.    Aorta: There is scattered, Grade 1, plaque throughout the descending  aorta. The ascending arota is calcified.     Pulmonary Artery: The pulmonary artery is of normal size.   Venous: The inferior vena cava is normal in size with greater than 50%  respiratory variability, suggesting right atrial  pressure of 3 mmHg.     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  I suspect his symptoms are related to his bicuspid aortic valve stenosis/regurgitation which will be addressed by Dr. Eldridge Dace as an outpatient.  The sheath was removed and a TR band was placed on the right wrist to achieve patent hemostasis.  The patient left lab in stable condition.     10/27/20: TTE IMPRESSIONS   1. Left ventricular ejection fraction, by estimation, is 55 to 60%. The  left ventricle has normal function. The left ventricle  has no regional  wall motion abnormalities. There is mild concentric left ventricular  hypertrophy. Left ventricular diastolic  parameters are consistent with Grade I diastolic dysfunction (impaired  relaxation). Elevated left ventricular end-diastolic pressure.   2. Right ventricular systolic function is mildly reduced. The right  ventricular size is moderately enlarged. There is normal pulmonary artery  systolic pressure. The estimated right ventricular systolic pressure is  20.6 mmHg.   3. Left atrial size was severely dilated.   4. Right atrial size was mild to moderately dilated.   5. The mitral valve is normal in structure. Trivial mitral valve  regurgitation. No evidence of mitral stenosis.   6. The aortic valve is bicuspid. There is severe calcifcation of the  aortic valve. There is severe thickening of the aortic valve. Aortic valve  regurgitation is mild. Moderate aortic valve stenosis. Aortic  regurgitation PHT measures 595 msec. Aortic  valve mean gradient measures 36.0 mmHg. Aortic valve Vmax measures 3.71  m/s.   7. Aortic dilatation noted. There is moderate dilatation of the ascending  aorta, measuring 44 mm.   8. The inferior vena cava is normal in size with greater than 50%  respiratory variability, suggesting right atrial pressure of 3 mmHg.   9. There appears to be moderate AS. The AVA is overestimated due to   overestimated LVOT diameter measurement. The dimnesionless index was 0.32.  Patient Profile     80 y.o. 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 Graciela Husbands has seen and examined the patient this morning, discussed rational for PPM, the implant procedure and potential risks/benefits The patient is agreeable to proceed   2. He has had some brief episodes of PAFib/flutter post op None noted last 24-48 hours Will defer anticoagulation decision to CTS service    For questions or updates, please contact [facility] Please consult [email or web address] for contact info under        Signed, Sheilah Pigeon, PA-C  12/07/2020, 8:49 AM   (As above) Will proceed with pacing for presyncope, AoV replacement with antecedent RBBB and now intermittent high grade heart block The benefits and risks were reviewed including but not limited to death,  perforation, infection, lead dislodgement and device malfunction.  The patient understands agrees and is willing to proceed.

## 2020-12-07 NOTE — Progress Notes (Signed)
6 Days Post-Op Procedure(s) (LRB): AORTIC VALVE REPLACEMENT (AVR) USING INSPIRIS AORTIC VALVE (N/A) TRANSESOPHAGEAL ECHOCARDIOGRAM (TEE) (N/A) Subjective: No complaints.  Objective: Vital signs in last 24 hours: Temp:  [98 F (36.7 C)-98.1 F (36.7 C)] 98 F (36.7 C) (06/22 0401) Pulse Rate:  [74] 74 (06/22 0401) Cardiac Rhythm: Heart block (06/22 0010) Resp:  [15-20] 17 (06/22 0401) BP: (139-140)/(82-84) 140/82 (06/22 0401) SpO2:  [93 %-96 %] 93 % (06/22 0401) Weight:  [106.8 kg] 106.8 kg (06/22 0555)  Hemodynamic parameters for last 24 hours:    Intake/Output from previous day: 06/21 0701 - 06/22 0700 In: 240 [P.O.:240] Out: -  Intake/Output this shift: No intake/output data recorded.  General appearance: alert and cooperative Neurologic: intact Heart: regular rate and rhythm, S1, S2 normal, no murmur Lungs: clear to auscultation bilaterally Extremities: no edema Wound: incision healing well  Lab Results: No results for input(s): WBC, HGB, HCT, PLT in the last 72 hours. BMET:  Recent Labs    12/06/20 0119  NA 138  K 4.1  CL 104  CO2 26  GLUCOSE 92  BUN 18  CREATININE 1.13  CALCIUM 8.8*    PT/INR: No results for input(s): LABPROT, INR in the last 72 hours. ABG    Component Value Date/Time   PHART 7.377 12/01/2020 1819   HCO3 19.8 (L) 12/01/2020 1819   TCO2 21 (L) 12/01/2020 1819   ACIDBASEDEF 5.0 (H) 12/01/2020 1819   O2SAT 98.0 12/01/2020 1819   CBG (last 3)  No results for input(s): GLUCAP in the last 72 hours.  Assessment/Plan: S/P Procedure(s) (LRB): AORTIC VALVE REPLACEMENT (AVR) USING INSPIRIS AORTIC VALVE (N/A) TRANSESOPHAGEAL ECHOCARDIOGRAM (TEE) (N/A)  POD 6 Hemodynamically stable in sinus rhythm. Telemetry shows a few missed beats. Planning PPM by EP today. Will remove temp wires after that.   Overall he has been very stable and I think he can go home tomorrow after PPM today if no changes.   LOS: 6 days    [name] 12/07/2020

## 2020-12-07 NOTE — Progress Notes (Signed)
Patient arrived after 3pm from cathlab  will hold pulling pacing wires until AM. Spoke with Gershon Crane PAC. Will monitor. Report given to oncoming RN . Jaedon Siler, Randall An RN

## 2020-12-07 NOTE — Progress Notes (Signed)
Ordered 6-week post-op Echo following AVR per protocol.   Primary Cardiologist: Dr. Eldridge Dace  CT surgeon: Dr. Laneta Simmers.  Corrin Parker, PA-C 12/07/2020 4:45 PM Pager: [phone]

## 2020-12-07 NOTE — Progress Notes (Signed)
Patient arrived to 4e17 from cath lab. Patient with left chest incision clean dry intact. With sling. Will monitor. Moody Robben, Randall An RN

## 2020-12-08 ENCOUNTER — Inpatient Hospital Stay (HOSPITAL_COMMUNITY): Payer: Medicare Other

## 2020-12-08 ENCOUNTER — Encounter (HOSPITAL_COMMUNITY): Payer: Self-pay | Admitting: Internal Medicine

## 2020-12-08 DIAGNOSIS — Z952 Presence of prosthetic heart valve: Secondary | ICD-10-CM

## 2020-12-08 DIAGNOSIS — Z950 Presence of cardiac pacemaker: Secondary | ICD-10-CM

## 2020-12-08 MED ORDER — ASPIRIN 325 MG PO TBEC: 325.0000 mg | DELAYED_RELEASE_TABLET | Freq: Every day | ORAL | 0 refills | Status: DC

## 2020-12-08 MED ORDER — TRAMADOL HCL 50 MG PO TABS: 50.0000 mg | ORAL_TABLET | Freq: Four times a day (QID) | ORAL | 0 refills | Status: AC | PRN

## 2020-12-08 MED FILL — Lidocaine HCl Local Inj 1%: INTRAMUSCULAR | Qty: 50 | Status: AC

## 2020-12-08 NOTE — Progress Notes (Addendum)
Progress Note  Patient Name: Derek Washington Date of Encounter: 12/08/2020  [facility] Cardiologist: Lance Muss, MD   Subjective   Feels well, no site pain, CP, no SOB  Inpatient Medications    Scheduled Meds:  amLODipine  10 mg Oral Daily   aspirin EC  325 mg Oral Daily   atorvastatin  20 mg Oral QPM   bisacodyl  10 mg Oral Daily   Or   bisacodyl  10 mg Rectal Daily   docusate sodium  200 mg Oral Daily   finasteride  5 mg Oral Daily   irbesartan  150 mg Oral Daily   mouth rinse  15 mL Mouth Rinse BID   pantoprazole  40 mg Oral Daily   sodium chloride flush  3 mL Intravenous Q12H   Continuous Infusions:  sodium chloride      ceFAZolin (ANCEF) IV 1 g (12/08/20 0158)   lactated ringers     PRN Meds: acetaminophen, ondansetron (ZOFRAN) IV, ondansetron (ZOFRAN) IV, oxyCODONE, sodium chloride flush, traMADol   Vital Signs    Vitals:   12/08/20 0339 12/08/20 0758 12/08/20 0800 12/08/20 0815  BP: (!) 145/79 (!) 144/90 128/82 134/77  Pulse: 75  74 72  Resp: 15  14 12   Temp: 97.9 F (36.6 C)     TempSrc: Oral     SpO2: 95%  93% 94%  Weight: 108 kg     Height:        Intake/Output Summary (Last 24 hours) at 12/08/2020 0908 Last data filed at 12/08/2020 0159 Gross per 24 hour  Intake 226.36 ml  Output 800 ml  Net -573.64 ml   Last 3 Weights 12/08/2020 12/07/2020 12/06/2020  Weight (lbs) 238 lb 1.6 oz 235 lb 6.4 oz 237 lb 6.4 oz  Weight (kg) 108 kg 106.777 kg 107.684 kg      Telemetry    SR rare A and or Av paced beat  ECG    SR  74, RBBB, 1st degree AVBlock, LAD - Personally Reviewed  Physical Exam   GEN: No acute distress.   Neck: No JVD Cardiac: RRR,1-2/6SM, no rubs, or gallops.  Respiratory: CTA b/l. GI: Soft, nontender, non-distended  MS: No edema; No deformity. Neuro:  Nonfocal  Psych: Normal affect   R chest: ppm site: dermabond intact, no bleeding hematoma, drainage  Labs    High Sensitivity Troponin:  No results for input(s):  TROPONINIHS in the last 720 hours.    Chemistry Recent Labs  Lab 12/03/20 0319 12/04/20 0128 12/06/20 0119  NA 136 135 138  K 4.2 4.8 4.1  CL 105 103 104  CO2 25 26 26   GLUCOSE 141* 113* 92  BUN 19 19 18   CREATININE 1.33* 1.23 1.13  CALCIUM 8.2* 8.7* 8.8*  GFRNONAA 54* 60* >60  ANIONGAP 6 6 8      Hematology Recent Labs  Lab 12/02/20 1551 12/03/20 0319 12/04/20 0128  WBC 14.9* 13.7* 9.7  RBC 3.24* 3.00* 2.94*  HGB 10.5* 9.8* 9.6*  HCT 31.6* 29.2* 28.8*  MCV 97.5 97.3 98.0  MCH 32.4 32.7 32.7  MCHC 33.2 33.6 33.3  RDW 14.1 14.2 14.2  PLT 131* PLATELET CLUMPS NOTED ON SMEAR, COUNT APPEARS DECREASED 103*    BNPNo results for input(s): BNP, PROBNP in the last 168 hours.   DDimer No results for input(s): DDIMER in the last 168 hours.   Radiology    DG Chest 2 View  Result Date: 12/08/2020 CLINICAL DATA:  Cardiac device in situ. EXAM:  CHEST - 2 VIEW COMPARISON:  Chest x-ray 12/04/2020. FINDINGS: Cardiac pacer with lead tips over the right atrium right ventricle. Prior median sternotomy. Cardiomegaly. No pulmonary venous congestion. Mild bibasilar subsegmental atelectasis. Tiny bilateral pleural effusions. No pneumothorax. IMPRESSION: 1. Cardiac pacer with lead tips over the right atrium and right ventricle. No pneumothorax. 2. Prior median sternotomy. Stable cardiomegaly. No pulmonary venous congestion. 3. Mild bibasilar subsegmental atelectasis. Tiny bilateral pleural effusions. Electronically Signed   By: Maisie Fus  Register   On: 12/08/2020 06:47    Cardiac Studies   12/01/20" inter-op TEE POST-OP IMPRESSIONS  Limited post-CPB exam: The patient separated easily from CPB.  - Left Ventricle: The left ventricular function is unchanged from  pre-bypass  images. Contractility is vigorous, with no wall motion abnormalities.  - Right Ventricle: The right ventricular function appears unchanged from  pre-bypass images.  - Aortic Valve: A pericardial bioprosthetic valve was  placed, leaflets are  freely mobile, with peak gradient 12 mmHg, mean gradient 7 mmHg. No  regurgitation post repair. No perivalvular leak noted. The valve is  functioning  well.  - Mitral Valve: The mitral valve function appears unchanged from  pre-bypass  images.  - Tricuspid Valve: The tricuspid valve function appears unchanged from  pre-bypass images.   PRE-OP FINDINGS   Left Ventricle: The left ventricle has normal systolic function, with an  ejection fraction of 60-65%, measured 62.6%. The cavity size was mildly  dilated. No evidence of left ventricular regional wall motion  abnormalities. There is no left ventricular  hypertrophy. Left ventricular diastolic function was not evaluated.   Right Ventricle: The right ventricle has normal systolic function. The  cavity was normal. There is no increase in right ventricular wall  thickness.   Left Atrium: Left atrial size was dilated. No left atrial/left atrial  appendage thrombus was detected. Left atrial appendage velocity is normal  at greater than 40 cm/s.   Right Atrium: Right atrial size was dilated.   Interatrial Septum: No atrial level shunt detected by color flow Doppler.   Pericardium: Trivial pericardial effusion is present. The pericardial  effusion is circumferential.   Mitral Valve: The mitral valve is normal in structure. Mitral valve  regurgitation is trivial by color flow Doppler. There is no evidence of  mitral valve vegetation. There is no evidence of mitral stenosis, with  peak grad 2 mmHg, mean grad 1 mmHg.   Tricuspid Valve: The tricuspid valve was normal in structure. Tricuspid  valve regurgitation is trivial by color flow Doppler. No evidence of  tricuspid stenosis is present. There is no evidence of tricuspid valve  vegetation.   Aortic Valve: The aotic annulus is calcified. The aortic valve is  bicuspid, with fusion of the Left and Right coronary cusps. All leaflet  tips are thickened. The Left  and Right fused leaflet is non-mobile. Aortic  valve regurgitation is mild by color flow   Doppler. The jet is eccentric, and posteriorly directed. There is  moderate stenosis of the aortic valve, with peak gradient 55 mmHg, and  mean gradient 34 mmHg, although the functioning Non-coronary leaftet moves  well. There is no evidence of aortic  valve vegetation.   Pulmonic Valve: The pulmonic valve was normal in structure, with normal  leaflet mobility. No evidence of pulmonic stenosis. Pulmonic valve  regurgitation is trivial, around the PA catheter, by color flow Doppler.    Aorta: There is scattered, Grade 1, plaque throughout the descending  aorta. The ascending arota is calcified.  Pulmonary Artery: The pulmonary artery is of normal size.   Venous: The inferior vena cava is normal in size with greater than 50%  respiratory variability, suggesting right atrial pressure of 3 mmHg.     11/03/20 [facility] IMPRESSION: Mr. Karnes has minimal CAD with at most a 50% lesion in his PLA branch.  I suspect his symptoms are related to his bicuspid aortic valve stenosis/regurgitation which will be addressed by Dr. Eldridge Dace as an outpatient.  The sheath was removed and a TR band was placed on the right wrist to achieve patent hemostasis.  The patient left lab in stable condition.     10/27/20: TTE IMPRESSIONS   1. Left ventricular ejection fraction, by estimation, is 55 to 60%. The  left ventricle has normal function. The left ventricle has no regional  wall motion abnormalities. There is mild concentric left ventricular  hypertrophy. Left ventricular diastolic  parameters are consistent with Grade I diastolic dysfunction (impaired  relaxation). Elevated left ventricular end-diastolic pressure.   2. Right ventricular systolic function is mildly reduced. The right  ventricular size is moderately enlarged. There is normal pulmonary artery  systolic pressure. The estimated right ventricular systolic  pressure is  20.6 mmHg.   3. Left atrial size was severely dilated.   4. Right atrial size was mild to moderately dilated.   5. The mitral valve is normal in structure. Trivial mitral valve  regurgitation. No evidence of mitral stenosis.   6. The aortic valve is bicuspid. There is severe calcifcation of the  aortic valve. There is severe thickening of the aortic valve. Aortic valve  regurgitation is mild. Moderate aortic valve stenosis. Aortic  regurgitation PHT measures 595 msec. Aortic  valve mean gradient measures 36.0 mmHg. Aortic valve Vmax measures 3.71  m/s.   7. Aortic dilatation noted. There is moderate dilatation of the ascending  aorta, measuring 44 mm.   8. The inferior vena cava is normal in size with greater than 50%  respiratory variability, suggesting right atrial pressure of 3 mmHg.   9. There appears to be moderate AS. The AVA is overestimated due to  overestimated LVOT diameter measurement. The dimnesionless index was 0.32.  Patient Profile     80 y.o. 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 Graciela Husbands CXR this AM with no ptx Device check this AM with intact function Site is stable Wound care and activity restrictions were reviewed with the patient Post pacing f/u is in place   2. He has had some brief episodes of PAFib/flutter post op None noted last 48 +hours Will defer anticoagulation decision to CTS service No OAC please from PPM perspective for 3 days if it is decided  that he needs   Dr. Lalla Brothers will see later this AM, though anticipate will be clear to discharge from EP perspective  ADDEND: Dr. Lalla Brothers has seen and examined the patient OK to discharge from EP standpoint    For questions or updates, please contact [facility] Please consult  [email or web address] for contact info under        Signed, Sheilah PigeonRenee Lynn Darianne Muralles, PA-C  12/08/2020, 9:08 AM   (

## 2020-12-08 NOTE — Progress Notes (Addendum)
[address]       [place] [address]             [phone]      1 Day Post-Op Procedure(s) (LRB): PACEMAKER IMPLANT (N/A) Subjective: Pacer placed yesterday, feels well  Objective: Vital signs in last 24 hours: Temp:  [97.8 F (36.6 C)-98.1 F (36.7 C)] 97.9 F (36.6 C) (06/23 0339) Pulse Rate:  [67-157] 75 (06/23 0339) Cardiac Rhythm: Normal sinus rhythm (06/22 2355) Resp:  [0-38] 15 (06/23 0339) BP: (120-169)/(73-110) 145/79 (06/23 0339) SpO2:  [0 %-100 %] 95 % (06/23 0339) Weight:  [818 kg] 108 kg (06/23 0339)  Hemodynamic parameters for last 24 hours:    Intake/Output from previous day: 06/22 0701 - 06/23 0700 In: 226.4 [I.V.:226.4] Out: 800 [Urine:800] Intake/Output this shift: No intake/output data recorded.  General appearance: alert, cooperative, and no distress Heart: regular rate and rhythm Lungs: clear to auscultation bilaterally Abdomen: benign Extremities: trace edema Wound: incis healing well  Lab Results: No results for input(s): WBC, HGB, HCT, PLT in the last 72 hours. BMET:  Recent Labs    12/06/20 0119  NA 138  K 4.1  CL 104  CO2 26  GLUCOSE 92  BUN 18  CREATININE 1.13  CALCIUM 8.8*    PT/INR: No results for input(s): LABPROT, INR in the last 72 hours. ABG    Component Value Date/Time   PHART 7.377 12/01/2020 1819   HCO3 19.8 (L) 12/01/2020 1819   TCO2 21 (L) 12/01/2020 1819   ACIDBASEDEF 5.0 (H) 12/01/2020 1819   O2SAT 98.0 12/01/2020 1819   CBG (last 3)  No results for input(s): GLUCAP in the last 72 hours.  Meds Scheduled Meds:  amLODipine  10 mg Oral Daily   aspirin EC  325 mg Oral Daily   atorvastatin  20 mg Oral QPM   bisacodyl  10 mg Oral Daily   Or   bisacodyl  10 mg Rectal Daily   docusate sodium  200 mg Oral Daily   finasteride  5 mg Oral Daily   furosemide  40 mg Oral Daily   irbesartan  150 mg Oral Daily   mouth rinse  15 mL Mouth Rinse BID   pantoprazole  40 mg Oral Daily    potassium chloride  20 mEq Oral Daily   sodium chloride flush  3 mL Intravenous Q12H   Continuous Infusions:  sodium chloride      ceFAZolin (ANCEF) IV 1 g (12/08/20 0158)   lactated ringers     PRN Meds:.acetaminophen, ondansetron (ZOFRAN) IV, ondansetron (ZOFRAN) IV, oxyCODONE, sodium chloride flush, traMADol  Xrays DG Chest 2 View  Result Date: 12/08/2020 CLINICAL DATA:  Cardiac device in situ. EXAM: CHEST - 2 VIEW COMPARISON:  Chest x-ray 12/04/2020. FINDINGS: Cardiac pacer with lead tips over the right atrium right ventricle. Prior median sternotomy. Cardiomegaly. No pulmonary venous congestion. Mild bibasilar subsegmental atelectasis. Tiny bilateral pleural effusions. No pneumothorax. IMPRESSION: 1. Cardiac pacer with lead tips over the right atrium and right ventricle. No pneumothorax. 2. Prior median sternotomy. Stable cardiomegaly. No pulmonary venous congestion. 3. Mild bibasilar subsegmental atelectasis. Tiny bilateral pleural effusions. Electronically Signed   By: Maisie Fus  Register   On: 12/08/2020 06:47    Assessment/Plan: S/P Procedure(s) (LRB): PACEMAKER IMPLANT (N/A)  1 afebrile, sBP 120's - 160's, sinus rhythm 2 sats good on RA 3 CXR minor atx/tiny effusions 4 EKG- bifascicular block 5 no new labs 6 will need EP/Pacer check prior to  d/c decision, EPW's to be removed this am- returned late to floor yesterday 7 weight below preop- will d/c lasix      LOS: 7 days    Rowe Clack PA-C Pager [phone] 12/08/2020   Chart reviewed, patient examined, agree with above. He feels well and can go home today if ok with EP. CXR looks ok.

## 2020-12-08 NOTE — Progress Notes (Signed)
Patient given discharge instructions, medication list and follow up appointments. IV and tele were dcd. Will discharge home as ordered. Transported to exit via wheel chair and nursing staff. Kamree Wiens Jessup RN  

## 2020-12-08 NOTE — Progress Notes (Signed)
Patient EPW pulled per protocol and as ordered. All ends intact. Patient reminded to lie supine approximately one hour bp 128/82 heart rate 75 on monitor. Darcey Demma, Randall An RN

## 2020-12-08 NOTE — Progress Notes (Signed)
CARDIAC REHAB PHASE I   PRE:  Rate/Rhythm: v paced 77  sinus  BP:  Supine: 137/85  Sitting:   Standing:    SaO2: 96%RA  MODE:  Ambulation: 450 ft   POST:  Rate/Rhythm: 93 SR, v paced 81  BP:  Supine:   Sitting: 156/106, 149/82  Standing:    SaO2: 96%RA 0942-1010 Pt walked 450 ft independently with steady gait. Needed reminding to keep left arm down due to pacemaker. BP elevated after walk but taken again immediately  and at 149/82. To recliner after walk with wife in room.  [name], RN BSN  12/08/2020 10:05 AM

## 2020-12-09 LAB — TYPE AND SCREEN
ABO/RH(D): A POS
Antibody Screen: NEGATIVE
Unit division: 0
Unit division: 0

## 2020-12-09 LAB — BPAM RBC
Blood Product Expiration Date: 202207082359
Blood Product Expiration Date: 202207082359
ISSUE DATE / TIME: 202206160748
ISSUE DATE / TIME: 202206160748
Unit Type and Rh: 6200
Unit Type and Rh: 6200

## 2020-12-13 ENCOUNTER — Encounter (INDEPENDENT_AMBULATORY_CARE_PROVIDER_SITE_OTHER): Payer: Self-pay

## 2020-12-13 ENCOUNTER — Other Ambulatory Visit: Payer: Self-pay

## 2020-12-13 DIAGNOSIS — Z4802 Encounter for removal of sutures: Secondary | ICD-10-CM

## 2020-12-15 ENCOUNTER — Other Ambulatory Visit: Payer: Self-pay

## 2020-12-15 ENCOUNTER — Ambulatory Visit (INDEPENDENT_AMBULATORY_CARE_PROVIDER_SITE_OTHER): Payer: Medicare Other

## 2020-12-15 ENCOUNTER — Telehealth (HOSPITAL_COMMUNITY): Payer: Self-pay

## 2020-12-15 DIAGNOSIS — I442 Atrioventricular block, complete: Secondary | ICD-10-CM | POA: Diagnosis not present

## 2020-12-15 LAB — CUP PACEART INCLINIC DEVICE CHECK
Battery Remaining Longevity: 149 mo
Battery Voltage: 3.22 V
Brady Statistic AP VP Percent: 1.18 %
Brady Statistic AP VS Percent: 0 %
Brady Statistic AS VP Percent: 98.56 %
Brady Statistic AS VS Percent: 0.25 %
Brady Statistic RA Percent Paced: 1.24 %
Brady Statistic RV Percent Paced: 99.28 %
Date Time Interrogation Session: 20220630154942
Implantable Lead Implant Date: 20220622
Implantable Lead Implant Date: 20220622
Implantable Lead Location: 753859
Implantable Lead Location: 753860
Implantable Lead Model: 3830
Implantable Lead Model: 5076
Implantable Pulse Generator Implant Date: 20220622
Lead Channel Impedance Value: 266 Ohm
Lead Channel Impedance Value: 418 Ohm
Lead Channel Impedance Value: 475 Ohm
Lead Channel Impedance Value: 627 Ohm
Lead Channel Pacing Threshold Amplitude: 0.5 V
Lead Channel Pacing Threshold Amplitude: 1.25 V
Lead Channel Pacing Threshold Pulse Width: 0.4 ms
Lead Channel Pacing Threshold Pulse Width: 0.4 ms
Lead Channel Sensing Intrinsic Amplitude: 11.5 mV
Lead Channel Sensing Intrinsic Amplitude: 3.25 mV
Lead Channel Setting Pacing Amplitude: 3.5 V
Lead Channel Setting Pacing Amplitude: 3.5 V
Lead Channel Setting Pacing Pulse Width: 0.4 ms
Lead Channel Setting Sensing Sensitivity: 0.9 mV

## 2020-12-15 NOTE — Telephone Encounter (Signed)
Called and spoke with pt in regards to CR, pt stated he is not interested at this time.   Closed referral 

## 2020-12-15 NOTE — Telephone Encounter (Signed)
Pt insurance is active and benefits verified through [facility]. Co-pay $0.00, DED $0.00/$0.00 met, out of pocket $3,600.00/$922.32 met, co-insurance 0%. No pre-authorization required. Passport, 12/15/20 @ 11:42AM, OZD#[id number]   Will contact patient to see if he is interested in the Cardiac Rehab Program. If interested, patient will need to complete follow up appt. Once completed, patient will be contacted for scheduling upon review by the RN Navigator.

## 2020-12-15 NOTE — Progress Notes (Signed)
Wound check appointment. Dermabond removed. Wound without redness or edema. Incision edges approximated, wound well healed. Normal device function. Thresholds, sensing, and impedances consistent with implant measurements. Device programmed at 3.5V/auto capture programmed on for extra safety margin until 3 month visit. Histogram distribution appropriate for patient and level of activity. 8 mode switches, longest episode 42 minutes, peak A/V rates 400/72.  Reviewed with Dr. Graciela Husbands, he advises SCAF, continue monitoring for now.   No high ventricular rates noted. Patient educated about wound care, arm mobility, lifting restrictions. ROV with Dr. Graciela Husbands 03/21/21.  Patient is enrolled in remote monitoring, next scheduled check 03/08/21.

## 2020-12-15 NOTE — Patient Instructions (Signed)
   After Your Pacemaker   Monitor your pacemaker site for redness, swelling, and drainage. Call the device clinic at [phone] if you experience these symptoms or fever/chills.  If your incision is closed with Dermabond:  You may shower 1 day after your defibrillator implant and wash your incision with soap and water. Avoid lotions, ointments, or perfumes over your incision until it is well-healed.  You may use a hot tub or a pool after your wound check appointment if the incision is completely closed.  Do not lift over 10 pounds with the affected arm until 6 weeks after your procedure. There are no other restrictions in arm movement after your wound check appointment.  You may drive, unless driving has been restricted by your healthcare providers.  Your Pacemaker is MRI compatible.  Remote monitoring is used to monitor your pacemaker from home. This monitoring is scheduled every 91 days by our office. It allows [place] to keep an eye on the functioning of your device to ensure it is working properly. You will routinely see your Electrophysiologist annually (more often if necessary).

## 2020-12-21 ENCOUNTER — Telehealth: Payer: Self-pay

## 2020-12-21 NOTE — Telephone Encounter (Signed)
Carelink alert received for "AF > burden (>6hrs/24hrs). Presenting rhythm AFlutter with rates 80-90bpm. Onset 12/19/20 @ 1458. History of short episodes, (<1hr seen at post-op 12/15/20). On ASA."  Successful telephone encounter to patient to assess for s/s of AFL. Known hx of SCAF (see wound check note 12/15/20) Patient states he did not feel well and had some shortness of breath the evening of onset. Patient feels well today. Assisted patient with sending current transmission. Current EGM AS/VP@80 . Will forward to Dr. Graciela Husbands for review.  Current EGM 12/21/20@ 23:59   Transmission  12/19/20@21 :36

## 2020-12-22 ENCOUNTER — Telehealth (HOSPITAL_COMMUNITY): Payer: Self-pay

## 2020-12-22 ENCOUNTER — Other Ambulatory Visit: Payer: Self-pay

## 2020-12-22 ENCOUNTER — Ambulatory Visit: Payer: Medicare Other

## 2020-12-22 ENCOUNTER — Encounter: Payer: Self-pay | Admitting: Interventional Cardiology

## 2020-12-22 ENCOUNTER — Ambulatory Visit: Payer: Medicare Other | Admitting: Interventional Cardiology

## 2020-12-22 VITALS — BP 142/88 | HR 77 | Ht 72.0 in | Wt 232.0 lb

## 2020-12-22 DIAGNOSIS — E782 Mixed hyperlipidemia: Secondary | ICD-10-CM

## 2020-12-22 DIAGNOSIS — Z952 Presence of prosthetic heart valve: Secondary | ICD-10-CM

## 2020-12-22 DIAGNOSIS — Z950 Presence of cardiac pacemaker: Secondary | ICD-10-CM | POA: Diagnosis not present

## 2020-12-22 DIAGNOSIS — I10 Essential (primary) hypertension: Secondary | ICD-10-CM | POA: Diagnosis not present

## 2020-12-22 DIAGNOSIS — I4892 Unspecified atrial flutter: Secondary | ICD-10-CM

## 2020-12-22 NOTE — Telephone Encounter (Signed)
Called patient to see if he is interested in the Cardiac Rehab Program. Patient expressed interest. Explained scheduling process and went over insurance, patient verbalized understanding. Will contact patient for scheduling once f/u has been completed.  °

## 2020-12-22 NOTE — Telephone Encounter (Signed)
Recv'ed a phone call from [facility] Dr. Eldridge Dace nurse. Who stated pt had a f/u appt today in the office and changed his mind. He is now interested in CR. Marita Snellen I will pass pt information to RN for review.

## 2020-12-22 NOTE — Patient Instructions (Signed)
Medication Instructions:  Your physician recommends that you continue on your current medications as directed. Please refer to the Current Medication list given to you today.  *If you need a refill on your cardiac medications before your next appointment, please call your pharmacy*   Lab Work: none If you have labs (blood work) drawn today and your tests are completely normal, you will receive your results only by: MyChart Message (if you have MyChart) OR A paper copy in the mail If you have any lab test that is abnormal or we need to change your treatment, we will call you to review the results.   Testing/Procedures: none   Follow-Up: At [facility], you and your health needs are our priority.  As part of our continuing mission to provide you with exceptional heart care, we have created designated Provider Care Teams.  These Care Teams include your primary Cardiologist (physician) and Advanced Practice Providers (APPs -  Physician Assistants and Nurse Practitioners) who all work together to provide you with the care you need, when you need it.  We recommend signing up for the patient portal called "MyChart".  Sign up information is provided on this After Visit Summary.  MyChart is used to connect with patients for Virtual Visits (Telemedicine).  Patients are able to view lab/test results, encounter notes, upcoming appointments, etc.  Non-urgent messages can be sent to your provider as well.   To learn more about what you can do with MyChart, go to [email or web address].    Your next appointment:   November 4,2022 at 3:00  The format for your next appointment:   In Person  Provider:   Everette Rank, MD   Other Instructions

## 2020-12-22 NOTE — Progress Notes (Signed)
Cardiology Office Note   Date:  12/22/2020   ID:  Derek Washington, DOB [birth year], MRN [id number]  PCP:  [name], MD    No chief complaint on file.  S/p AVR, pacemaker  Wt Readings from Last 3 Encounters:  12/22/20 232 lb (105.2 kg)  12/08/20 238 lb 1.6 oz (108 kg)  11/29/20 234 lb 11.2 oz (106.5 kg)       History of Present Illness: Derek Washington is a 80 y.o. male  with a history of bicuspid aortic valve, aortic stenosis and aortic regurgitation.    He moved to [place] from [place].     He reported more shortness of breath in 2022. Echo showed Aortic valve Mean gradient increased to 36 mm.  Ao root was 44.   I spoke to his son-in-law, Dr. Margo Aye, who explained that the patient has had some fatigue, DOE and two "stumbles" where the details are unclear whether he lost consciousness. He does not think he blacked out. On one occasion, he  fell into a chair and it broke.   Wife noted that when they walk, he will have some chest pressure once the HR goes to 115.  Not every time he walks.  Sx resolve when he slows down and HR drops.   Has occasional PND.  He eventually had cath and AVR.  Due to persistent bradycardia, he had a pacer placed.  He had atrial flutter as well.    He had some twinges at the chest incision.  They lasted a second here there.  Overall, he is back to walking.  His ability to exert, particularly when walking up stairs is significantly improved.   Past Medical History:  Diagnosis Date   Bicuspid aortic valve    LAST ECHOCARDIOGRAM APPROXIMATELY 2016? MILD AORTIC REGURG   BPH (benign prostatic hyperplasia)    Hard of hearing    Heart murmur    High cholesterol    Hydrocele    Hypertension    Insomnia    Low back pain    RIGHT   Mild obesity    Nocturia    Right elbow tendonitis    Tinnitus     Past Surgical History:  Procedure Laterality Date   AORTIC VALVE REPLACEMENT N/A 12/01/2020   Procedure: AORTIC VALVE REPLACEMENT (AVR) USING  INSPIRIS AORTIC VALVE;  Surgeon: [name], MD;  Location: [facility] OR;  Service: Open Heart Surgery;  Laterality: N/A;   HYDROCELE EXCISION  2017   DR. [name] 2017   JOINT REPLACEMENT     left total knee replacement      2007    PACEMAKER IMPLANT N/A 12/07/2020   Procedure: PACEMAKER IMPLANT;  Surgeon: [name], MD;  Location: [facility] INVASIVE CV LAB;  Service: Cardiovascular;  Laterality: N/A;   right knee total replacement      2011   RIGHT/LEFT HEART CATH AND CORONARY ANGIOGRAPHY N/A 11/03/2020   Procedure: RIGHT/LEFT HEART CATH AND CORONARY ANGIOGRAPHY;  Surgeon: [name], MD;  Location: [facility] INVASIVE CV LAB;  Service: Cardiovascular;  Laterality: N/A;   SCROTAL EXPLORATION N/A 12/26/2015   Procedure: SCROTUM EXPLORATION;  Surgeon: [name], MD;  Location: [facility] ORS;  Service: Urology;  Laterality: N/A;  EXCISION OF LEFT EPIDIDYMAL CYST/SPERMATOCELE   TEE WITHOUT CARDIOVERSION N/A 12/01/2020   Procedure: TRANSESOPHAGEAL ECHOCARDIOGRAM (TEE);  Surgeon: [name], MD;  Location[facility] OR;  Service: Open Heart Surgery;  Laterality: N/A;   TONSILLECTOMY  VASECTOMY     1968     Current Outpatient Medications  Medication Sig Dispense Refill   amLODipine (NORVASC) 10 MG tablet Take 10 mg by mouth every evening.     aspirin EC 325 MG EC tablet Take 1 tablet (325 mg total) by mouth daily. 30 tablet 0   atorvastatin (LIPITOR) 20 MG tablet Take 20 mg by mouth every evening.     cholecalciferol (VITAMIN D) 25 MCG (1000 UNIT) tablet Take 1,000 Units by mouth in the morning.     finasteride (PROSCAR) 5 MG tablet Take 5 mg by mouth in the morning.     Multiple Vitamin (MULTIVITAMIN WITH MINERALS) TABS tablet Take 1 tablet by mouth in the morning.     olmesartan (BENICAR) 20 MG tablet Take 20 mg by mouth in the morning.     Omega-3 Fatty Acids (FISH OIL) 1000 MG CAPS Take 1,000 mg by mouth 2 (two) times daily.     No current facility-administered medications for this  visit.    Allergies:   Hydrochlorothiazide    Social History:  The patient  reports that he has never smoked. He has never used smokeless tobacco. He reports current alcohol use. He reports that he does not use drugs.   Family History:  The patient's family history includes COPD (age of onset: 49) in his father; Other (age of onset: 75) in his mother and sister.    ROS:  Please see the history of present illness.   Otherwise, review of systems are positive for rare sensatons around the chest incision site.   All other systems are reviewed and negative.    PHYSICAL EXAM: VS:  BP (!) 142/88   Pulse 77   Ht 6' (1.829 m)   Wt 232 lb (105.2 kg)   SpO2 95%   BMI 31.46 kg/m  , BMI Body mass index is 31.46 kg/m. GEN: Well nourished, well developed, in no acute distress HEENT: normal Neck: no JVD, carotid bruits, or masses Cardiac: RRR; no murmurs, rubs, or gallops,no edema  Respiratory:  clear to auscultation bilaterally, normal work of breathing GI: soft, nontender, nondistended, + BS MS: no deformity or atrophy Skin: warm and dry, no rash Neuro:  Strength and sensation are intact Psych: euthymic mood, full affect   EKG:   The ekg ordered 6/23 demonstrates normal sinus rhythm, right bundle branch block, left anterior fascicular block   Recent Labs: 11/29/2020: ALT 23 12/02/2020: Magnesium 2.1 12/04/2020: Hemoglobin 9.6; Platelets 103 12/06/2020: BUN 18; Creatinine, Ser 1.13; Potassium 4.1; Sodium 138   Lipid Panel No results found for: CHOL, TRIG, HDL, CHOLHDL, VLDL, LDLCALC, LDLDIRECT   Other studies Reviewed: Additional studies/ records that were reviewed today with results demonstrating: Hospital records reviewed.   ASSESSMENT AND PLAN:  S/p AVR: needs SBE prophylaxis.  Atrial flutter noted on pacemaker on July 4.  Greater than 6 hours.  Will discuss with Dr. Laneta Simmers and Dr. Graciela Husbands regarding switching his aspirin to Eliquis 5 mg twice a day.  We may need to use this at  least short-term.  Anticoagulation was not used at hospital discharge due to his pacemaker implant.  He could not identify distinct palpitations but did say he felt somewhat tired on the day of the atrial flutter. HTN: The current medical regimen is effective;  continue present plan and medications. Hyperlipidemia LDL 94 in 2022.  Continue atorvastatin. Pacer: followed by EP.    Current medicines are reviewed at length with the patient today.  The  patient concerns regarding his medicines were addressed.  The following changes have been made:  No change  Labs/ tests ordered today include:  No orders of the defined types were placed in this encounter.   Recommend 150 minutes/week of aerobic exercise Low fat, low carb, high fiber diet recommended  Disposition:   FU in 4 months   Signed, Lance Muss, MD  12/22/2020 10:37 AM    [facility] [address] [place], [place], [place]  [address] Phone: [phone]; Fax: [phone]

## 2020-12-23 ENCOUNTER — Telehealth: Payer: Self-pay | Admitting: Interventional Cardiology

## 2020-12-23 NOTE — Telephone Encounter (Signed)
Spoke with pt and reviewed information from Dr. Eldridge Dace.  Pt agreeable to plan and appreciative for call.

## 2020-12-23 NOTE — Telephone Encounter (Signed)
   Discussed with Dr. Graciela Husbands.  Due to short duration of atrial flutter (6 hours), will continue aspirin and continue to monitor.  If he has episodes between 12-24 hours, would reconsider starting ELiquis.   [name], MD

## 2020-12-28 ENCOUNTER — Ambulatory Visit: Payer: Medicare Other | Admitting: Cardiology

## 2020-12-30 NOTE — Telephone Encounter (Signed)
He is post op from AVR in June 2022.  If he has not had any AFib from July 4 until now, does that change things?

## 2021-01-02 MED ORDER — APIXABAN 5 MG PO TABS: 5.0000 mg | ORAL_TABLET | Freq: Two times a day (BID) | ORAL | 3 refills | Status: DC

## 2021-01-02 NOTE — Telephone Encounter (Signed)
Of Note Pacemaker date and time is 24 hours ahead.Today is 7/18 however device is reporting 7/19  Successful telephone encounter to patient to request manual transmission to assess for additional AT/AF events. Patient AS/VP today, rate 77. There are 5 additional AT/AF episodes since July 4th with EGMs. Most recent and longest 12/31/20 (actually 12/30/20) beginning at 0348 and lasting 65 hours, 51 min. Average A/V 280/74. Fastest A/V 306/75. Patient denies symptoms and states "I feel great" and acknowledges daily exercise by walking 2 miles. Discuss s/s of cardiac emergencies and when to seek medical care. Patient appreciative of call and will wait for additional recommendations is applicable from MD.

## 2021-01-02 NOTE — Telephone Encounter (Signed)
Derek Washington,  Are we able to do a remote check to see if he has had more AFib since July 4?  Thanks.  JV

## 2021-01-02 NOTE — Telephone Encounter (Signed)
Placed order for Eliquis. Discontinued aspirin. Placed samples at front desk with patient assistance information if needed and saving card.

## 2021-01-02 NOTE — Addendum Note (Signed)
Addended by: Sampson Goon on: 01/02/2021 04:46 PM   Modules accepted: Orders

## 2021-01-16 ENCOUNTER — Ambulatory Visit (HOSPITAL_COMMUNITY): Payer: Medicare Other | Attending: Internal Medicine

## 2021-01-16 ENCOUNTER — Other Ambulatory Visit: Payer: Self-pay | Admitting: Surgery

## 2021-01-16 ENCOUNTER — Other Ambulatory Visit: Payer: Self-pay

## 2021-01-16 DIAGNOSIS — Z952 Presence of prosthetic heart valve: Secondary | ICD-10-CM

## 2021-01-16 DIAGNOSIS — I350 Nonrheumatic aortic (valve) stenosis: Secondary | ICD-10-CM | POA: Insufficient documentation

## 2021-01-16 LAB — ECHOCARDIOGRAM COMPLETE
AR max vel: 1.89 cm2
AV Area VTI: 1.88 cm2
AV Area mean vel: 1.98 cm2
AV Mean grad: 12 mmHg
AV Peak grad: 21.6 mmHg
Ao pk vel: 2.33 m/s
Area-P 1/2: 1.68 cm2
S' Lateral: 3.5 cm

## 2021-01-18 ENCOUNTER — Ambulatory Visit
Admission: RE | Admit: 2021-01-18 | Discharge: 2021-01-18 | Disposition: A | Discharge status: Home / Self Care | Payer: Medicare Other | Source: Ambulatory Visit | Attending: Surgery | Admitting: Surgery

## 2021-01-18 ENCOUNTER — Encounter: Payer: Self-pay | Admitting: Surgery

## 2021-01-18 ENCOUNTER — Ambulatory Visit (INDEPENDENT_AMBULATORY_CARE_PROVIDER_SITE_OTHER): Payer: Self-pay | Admitting: Surgery

## 2021-01-18 ENCOUNTER — Other Ambulatory Visit: Payer: Self-pay

## 2021-01-18 VITALS — BP 150/86 | HR 74 | Resp 20 | Ht 72.0 in | Wt 227.0 lb

## 2021-01-18 DIAGNOSIS — Z952 Presence of prosthetic heart valve: Secondary | ICD-10-CM

## 2021-01-18 NOTE — Progress Notes (Signed)
   HPI: Patient returns for routine postoperative follow-up having undergone AVR using a 25 mm Edwards INSPIRIS pericardial valve on 12/01/2020. 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 A PPM was implanted. Since hospital discharge the patient reports that he has been feeling well. He is walking 2 miles per day without difficulty.   Current Outpatient Medications  Medication Sig Dispense Refill   amLODipine (NORVASC) 10 MG tablet Take 10 mg by mouth every evening.     apixaban (ELIQUIS) 5 MG TABS tablet Take 1 tablet (5 mg total) by mouth 2 (two) times daily. 180 tablet 3   atorvastatin (LIPITOR) 20 MG tablet Take 20 mg by mouth every evening.     cholecalciferol (VITAMIN D) 25 MCG (1000 UNIT) tablet Take 1,000 Units by mouth in the morning.     finasteride (PROSCAR) 5 MG tablet Take 5 mg by mouth in the morning.     Multiple Vitamin (MULTIVITAMIN WITH MINERALS) TABS tablet Take 1 tablet by mouth in the morning.     olmesartan (BENICAR) 20 MG tablet Take 20 mg by mouth in the morning.     Omega-3 Fatty Acids (FISH OIL) 1000 MG CAPS Take 1,000 mg by mouth 2 (two) times daily.     No current facility-administered medications for this visit.    Physical Exam: BP (!) 150/86 (BP Location: Left Arm, Patient Position: Sitting)   Pulse 74   Resp 20   Ht 6' (1.829 m)   Wt 227 lb (103 kg)   SpO2 94% Comment: RA  BMI 30.79 kg/m  He looks well Cardiac exam shows a regular rate and rhythm with normal heart sounds. There is no murmur. Lungs are clear. The incision is healing well and the sternum is stable. There is no peripheral edema.  Diagnostic Tests:  Narrative & Impression  CLINICAL DATA:  Post aortic valve replacement   EXAM: CHEST - 2 VIEW   COMPARISON:  12/08/2020    FINDINGS: Changes of median sternotomy. Left pacer remains in place, unchanged. Heart is normal size. Scattered aortic atherosclerosis. No confluent airspace opacities or effusions. No acute bony abnormality.   IMPRESSION: No active cardiopulmonary disease.     Electronically Signed   By: Charlett Nose M.D.   On: 01/18/2021 09:30      Impression:  He is doing very well 7 weeks following his surgery. He has returned to driving. I encouraged him to keep ambulating as much as possible but to avoid heavy lifting, pushing and pulling for 3 months postop.   Plan:  He has an appt with EP in October and cardiology in November. He will return to see me if he has any problems with his incisions.   [name], MD [facility] [phone]

## 2021-02-06 ENCOUNTER — Other Ambulatory Visit: Payer: Self-pay | Admitting: Pharmacist

## 2021-02-06 ENCOUNTER — Telehealth (HOSPITAL_COMMUNITY): Payer: Self-pay

## 2021-02-06 MED ORDER — APIXABAN 5 MG PO TABS: 5.0000 mg | ORAL_TABLET | Freq: Two times a day (BID) | ORAL | 1 refills | Status: DC

## 2021-02-06 NOTE — Telephone Encounter (Signed)
Pt is not interested in the cardiac rehab program. Closed referral 

## 2021-03-08 ENCOUNTER — Ambulatory Visit (INDEPENDENT_AMBULATORY_CARE_PROVIDER_SITE_OTHER): Payer: Medicare Other

## 2021-03-08 DIAGNOSIS — I442 Atrioventricular block, complete: Secondary | ICD-10-CM

## 2021-03-08 LAB — CUP PACEART REMOTE DEVICE CHECK
Battery Remaining Longevity: 154 mo
Battery Voltage: 3.2 V
Brady Statistic AP VP Percent: 22.87 %
Brady Statistic AP VS Percent: 0 %
Brady Statistic AS VP Percent: 76.65 %
Brady Statistic AS VS Percent: 0.48 %
Brady Statistic RA Percent Paced: 23.06 %
Brady Statistic RV Percent Paced: 99.5 %
Date Time Interrogation Session: 20220920213345
Implantable Lead Implant Date: 20220622
Implantable Lead Implant Date: 20220622
Implantable Lead Location: 753859
Implantable Lead Location: 753860
Implantable Lead Model: 3830
Implantable Lead Model: 5076
Implantable Pulse Generator Implant Date: 20220622
Lead Channel Impedance Value: 247 Ohm
Lead Channel Impedance Value: 361 Ohm
Lead Channel Impedance Value: 456 Ohm
Lead Channel Impedance Value: 608 Ohm
Lead Channel Pacing Threshold Amplitude: 0.625 V
Lead Channel Pacing Threshold Amplitude: 1 V
Lead Channel Pacing Threshold Pulse Width: 0.4 ms
Lead Channel Pacing Threshold Pulse Width: 0.4 ms
Lead Channel Sensing Intrinsic Amplitude: 2.125 mV
Lead Channel Sensing Intrinsic Amplitude: 2.125 mV
Lead Channel Sensing Intrinsic Amplitude: 8.25 mV
Lead Channel Sensing Intrinsic Amplitude: 8.25 mV
Lead Channel Setting Pacing Amplitude: 2 V
Lead Channel Setting Pacing Amplitude: 2 V
Lead Channel Setting Pacing Pulse Width: 0.4 ms
Lead Channel Setting Sensing Sensitivity: 0.9 mV

## 2021-03-15 NOTE — Progress Notes (Signed)
Remote pacemaker transmission.   

## 2021-03-16 DIAGNOSIS — Z950 Presence of cardiac pacemaker: Secondary | ICD-10-CM | POA: Insufficient documentation

## 2021-03-16 DIAGNOSIS — I442 Atrioventricular block, complete: Secondary | ICD-10-CM | POA: Insufficient documentation

## 2021-03-21 ENCOUNTER — Ambulatory Visit (INDEPENDENT_AMBULATORY_CARE_PROVIDER_SITE_OTHER): Payer: Medicare Other | Admitting: Internal Medicine

## 2021-03-21 ENCOUNTER — Other Ambulatory Visit: Payer: Self-pay

## 2021-03-21 ENCOUNTER — Encounter: Payer: Self-pay | Admitting: Internal Medicine

## 2021-03-21 DIAGNOSIS — Z950 Presence of cardiac pacemaker: Secondary | ICD-10-CM | POA: Diagnosis not present

## 2021-03-21 DIAGNOSIS — I442 Atrioventricular block, complete: Secondary | ICD-10-CM

## 2021-03-21 NOTE — Patient Instructions (Addendum)
   Medication Instructions:  Your physician recommends that you continue on your current medications as directed. Please refer to the Current Medication list given to you today.  *If you need a refill on your cardiac medications before your next appointment, please call your pharmacy*   Lab Work: CBC today If you have labs (blood work) drawn today and your tests are completely normal, you will receive your results only by: MyChart Message (if you have MyChart) OR A paper copy in the mail If you have any lab test that is abnormal or we need to change your treatment, we will call you to review the results.   Testing/Procedures: None ordered.    Follow-Up: At [facility], you and your health needs are our priority.  As part of our continuing mission to provide you with exceptional heart care, we have created designated Provider Care Teams.  These Care Teams include your primary Cardiologist (physician) and Advanced Practice Providers (APPs -  Physician Assistants and Nurse Practitioners) who all work together to provide you with the care you need, when you need it.  We recommend signing up for the patient portal called "MyChart".  Sign up information is provided on this After Visit Summary.  MyChart is used to connect with patients for Virtual Visits (Telemedicine).  Patients are able to view lab/test results, encounter notes, upcoming appointments, etc.  Non-urgent messages can be sent to your provider as well.   To learn more about what you can do with MyChart, go to [email or web address].    Your next appointment:   9 months with Dr Graciela Husbands

## 2021-03-21 NOTE — Progress Notes (Addendum)
Patient Care Team: [name], MD as PCP - General (Internal Medicine) [name], MD as PCP - Cardiology (Cardiology)   HPI  Derek Washington is a 80 y.o. 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 fib and flutter anticoagulated with apixaban   DATE TEST EF   5/22 LHC    % Non obstruc CAD X 50% RPAV  8/22 Echo   55-65 %         Date Cr K Hgb  6/22 1.13 4.1 9.6           Records and Results Reviewed   Past Medical History:  Diagnosis Date   Bicuspid aortic valve    LAST ECHOCARDIOGRAM APPROXIMATELY 2016? MILD AORTIC REGURG   BPH (benign prostatic hyperplasia)    Hard of hearing    Heart murmur    High cholesterol    Hydrocele    Hypertension    Insomnia    Low back pain    RIGHT   Mild obesity    Nocturia    Right elbow tendonitis    Tinnitus     Past Surgical History:  Procedure Laterality Date   AORTIC VALVE REPLACEMENT N/A 12/01/2020   Procedure: AORTIC VALVE REPLACEMENT (AVR) USING INSPIRIS AORTIC VALVE;  Surgeon: [name], MD;  Location: [facility] OR;  Service: Open Heart Surgery;  Laterality: N/A;   HYDROCELE EXCISION  2017   DR. [name] 2017   JOINT REPLACEMENT     left total knee replacement      2007    PACEMAKER IMPLANT N/A 12/07/2020   Procedure: PACEMAKER IMPLANT;  Surgeon: [name], MD;  Location: [facility] INVASIVE CV LAB;  Service: Cardiovascular;  Laterality: N/A;   right knee total replacement      2011   RIGHT/LEFT HEART CATH AND CORONARY ANGIOGRAPHY N/A 11/03/2020   Procedure: RIGHT/LEFT HEART CATH AND CORONARY ANGIOGRAPHY;  Surgeon: [name], MD;  Location: [facility] INVASIVE CV LAB;  Service: Cardiovascular;  Laterality: N/A;   SCROTAL EXPLORATION N/A 12/26/2015   Procedure: SCROTUM EXPLORATION;   Surgeon: [name], MD;  Location: [facility] ORS;  Service: Urology;  Laterality: N/A;  EXCISION OF LEFT EPIDIDYMAL CYST/SPERMATOCELE   TEE WITHOUT CARDIOVERSION N/A 12/01/2020   Procedure: TRANSESOPHAGEAL ECHOCARDIOGRAM (TEE);  Surgeon: [name], MD;  Location: [facility] OR;  Service: Open Heart Surgery;  Laterality: N/A;   TONSILLECTOMY     VASECTOMY     1968    Current Meds  Medication Sig   amLODipine (NORVASC) 10 MG tablet Take 10 mg by mouth every evening.   apixaban (ELIQUIS) 5 MG TABS tablet Take 1 tablet (5 mg total) by mouth 2 (two) times daily.   atorvastatin (LIPITOR) 20 MG tablet Take 20 mg by mouth every evening.   cholecalciferol (VITAMIN D) 25 MCG (1000 UNIT) tablet Take 1,000 Units by mouth in the morning.   finasteride (PROSCAR) 5 MG tablet Take 5 mg by mouth in the morning.   Multiple Vitamin (MULTIVITAMIN WITH MINERALS) TABS tablet Take 1 tablet by mouth in the morning.   olmesartan (BENICAR) 20 MG tablet Take 20 mg by mouth in the morning.   Omega-3 Fatty Acids (FISH OIL) 1000 MG CAPS Take 1,000 mg by mouth 2 (two) times daily.    Allergies  Allergen Reactions   Hydrochlorothiazide Other (See  Comments)    fatigue      Review of Systems negative except from HPI and PMH  Physical Exam BP (!) 160/86   Pulse 66   Ht 6' (1.829 m)   Wt 228 lb 9.6 oz (103.7 kg)   SpO2 98%   BMI 31.00 kg/m  Well developed and well nourished in no acute distress HENT normal E scleral and icterus clear Neck Supple JVP flat; carotids brisk and full Clear to ausculation Regular rate and rhythm, 2/6 murmurs gallops or rub Soft with active bowel sounds No clubbing cyanosis  Edema Alert and oriented, grossly normal motor and sensory function Skin Warm and Dry  ECG sinus rhythm PVC versus PAC Left frontal branch block configuration QRS 154  #2 sinus with P synchronous pacing and infusion, with a PR interval of 216 and a QRS of 134  CrCl cannot be calculated (Patient's most  recent lab result is older than the maximum 21 days allowed.).   Assessment and  Plan  Intermittent heart block with preoperative right bundle branch block  First-degree AV block  Status post aortic valve replacement  Atrial fibrillation  Anemia   Significant reprogramming was undertaken to try and create fusion between his intrinsic right bundle branch block and right ventricular apical pacing.  With modest prolongation of the SENSED AV, we are able to decrease confusion narrowing the QRS to about 130 ms from about 165 for the left bundle branch block right ventricular apical pacing configuration and compared to the 170 ms for his intrinsic right bundle branch block.  It was also associated with a modest prolongation of the AV delay required to promote fusion.  The AV delay was programmed at about 180 ms, with his intra-atrial conduction delay the PR interval on his twelve-lead is about 260 ms.  I do not think this PR interval is too long to impact negatively AV synchrony  With hx of anemia, will recheck today-- may need further evaluation   Current medicines are reviewed at length with the patient today .  The patient does not  have concerns regarding medicines.

## 2021-03-22 LAB — CBC
Hematocrit: 42.5 % (ref 37.5–51.0)
Hemoglobin: 14.1 g/dL (ref 13.0–17.7)
MCH: 31 pg (ref 26.6–33.0)
MCHC: 33.2 g/dL (ref 31.5–35.7)
MCV: 93 fL (ref 79–97)
Platelets: 204 10*3/uL (ref 150–450)
RBC: 4.55 x10E6/uL (ref 4.14–5.80)
RDW: 14 % (ref 11.6–15.4)
WBC: 9.2 10*3/uL (ref 3.4–10.8)

## 2021-04-20 NOTE — Progress Notes (Signed)
Cardiology Office Note   Date:  04/21/2021   ID:  Derek Washington, DOB 03/08/1941, MRN [id number]  PCP:  [name], MD    No chief complaint on file.  S/p AVR  Wt Readings from Last 3 Encounters:  04/21/21 229 lb 9.6 oz (104.1 kg)  03/21/21 228 lb 9.6 oz (103.7 kg)  01/18/21 227 lb (103 kg)       History of Present Illness: Derek Washington is a 80 y.o. male    with a history of bicuspid aortic valve, aortic stenosis and aortic regurgitation.    He moved to [place] from [place].     He reported more shortness of breath in 2022. Echo showed Aortic valve Mean gradient increased to 36 mm.  Ao root was 44.  Had some near syncope at that time.  He eventually had cath and AVR.  Due to persistent bradycardia, he had a pacer placed.  He had atrial flutter as well.  Eliquis was started due to high burden of AFib.     Denies : Chest pain.  Leg edema. Nitroglycerin use. Orthopnea. Palpitations. Paroxysmal nocturnal dyspnea. Shortness of breath. Syncope.    Had some dizziness with turning his head a certain way.  He feels like the room is spinning.  Stamina has improved greatly.  He thinks the aortic stenosis was likely bothering him for several months prior to his surgery but he just avoided activities that cause symptoms.  Now he is walking up hills without any difficulty.    Past Medical History:  Diagnosis Date   Bicuspid aortic valve    LAST ECHOCARDIOGRAM APPROXIMATELY 2016? MILD AORTIC REGURG   BPH (benign prostatic hyperplasia)    Hard of hearing    Heart murmur    High cholesterol    Hydrocele    Hypertension    Insomnia    Low back pain    RIGHT   Mild obesity    Nocturia    Right elbow tendonitis    Tinnitus     Past Surgical History:  Procedure Laterality Date   AORTIC VALVE REPLACEMENT N/A 12/01/2020   Procedure: AORTIC VALVE REPLACEMENT (AVR) USING INSPIRIS AORTIC VALVE;  Surgeon: [name], MD;  Location: [facility] OR;  Service: Open Heart Surgery;   Laterality: N/A;   HYDROCELE EXCISION  2017   DR. [name] 2017   JOINT REPLACEMENT     left total knee replacement      2007    PACEMAKER IMPLANT N/A 12/07/2020   Procedure: PACEMAKER IMPLANT;  Surgeon: [name], MD;  Location: [facility] INVASIVE CV LAB;  Service: Cardiovascular;  Laterality: N/A;   right knee total replacement      2011   RIGHT/LEFT HEART CATH AND CORONARY ANGIOGRAPHY N/A 11/03/2020   Procedure: RIGHT/LEFT HEART CATH AND CORONARY ANGIOGRAPHY;  Surgeon: [name], MD;  Location: [facility] INVASIVE CV LAB;  Service: Cardiovascular;  Laterality: N/A;   SCROTAL EXPLORATION N/A 12/26/2015   Procedure: SCROTUM EXPLORATION;  Surgeon: [name], MD;  Location: [facility] ORS;  Service: Urology;  Laterality: N/A;  EXCISION OF LEFT EPIDIDYMAL CYST/SPERMATOCELE   TEE WITHOUT CARDIOVERSION N/A 12/01/2020   Procedure: TRANSESOPHAGEAL ECHOCARDIOGRAM (TEE);  Surgeon: [name], MD;  Location: [facility] OR;  Service: Open Heart Surgery;  Laterality: N/A;   TONSILLECTOMY     VASECTOMY     1968     Current Outpatient Medications  Medication Sig Dispense Refill   amLODipine (NORVASC) 10 MG tablet Take 10  mg by mouth every evening.     apixaban (ELIQUIS) 5 MG TABS tablet Take 1 tablet (5 mg total) by mouth 2 (two) times daily. 180 tablet 1   atorvastatin (LIPITOR) 20 MG tablet Take 20 mg by mouth every evening.     cholecalciferol (VITAMIN D) 25 MCG (1000 UNIT) tablet Take 1,000 Units by mouth in the morning.     finasteride (PROSCAR) 5 MG tablet Take 5 mg by mouth in the morning.     Multiple Vitamin (MULTIVITAMIN WITH MINERALS) TABS tablet Take 1 tablet by mouth in the morning.     olmesartan (BENICAR) 20 MG tablet Take 20 mg by mouth in the morning.     Omega-3 Fatty Acids (FISH OIL) 1000 MG CAPS Take 1,000 mg by mouth 2 (two) times daily.     No current facility-administered medications for this visit.    Allergies:   Hydrochlorothiazide    Social History:  The patient   reports that he has never smoked. He has never used smokeless tobacco. He reports current alcohol use. He reports that he does not use drugs.   Family History:  The patient's family history includes COPD (age of onset: 37) in his father; Other (age of onset: 8) in his mother and sister.    ROS:  Please see the history of present illness.   Otherwise, review of systems are positive for vertigo sx.   All other systems are reviewed and negative.    PHYSICAL EXAM: VS:  BP 132/84   Pulse 78   Ht 6' (1.829 m)   Wt 229 lb 9.6 oz (104.1 kg)   SpO2 95%   BMI 31.14 kg/m  , BMI Body mass index is 31.14 kg/m. GEN: Well nourished, well developed, in no acute distress HEENT: normal Neck: no JVD, carotid bruits, or masses Cardiac: RRR; no murmurs, rubs, or gallops,no edema  Respiratory:  clear to auscultation bilaterally, normal work of breathing GI: soft, nontender, nondistended, + BS MS: no deformity or atrophy Skin: warm and dry, no rash Neuro:  Strength and sensation are intact Psych: euthymic mood, full affect    Recent Labs: 11/29/2020: ALT 23 12/02/2020: Magnesium 2.1 12/06/2020: BUN 18; Creatinine, Ser 1.13; Potassium 4.1; Sodium 138 03/21/2021: Hemoglobin 14.1; Platelets 204   Lipid Panel No results found for: CHOL, TRIG, HDL, CHOLHDL, VLDL, LDLCALC, LDLDIRECT   Other studies Reviewed: Additional studies/ records that were reviewed today with results demonstrating: labs reviewed.   ASSESSMENT AND PLAN:  S/p AVR for bicuspid aortic valve.  Has had some dizziness with certain head movements.  OK to try Epley maneuvers.   HTN: The current medical regimen is effective;  continue present plan and medications. Hyperlipidemia: The current medical regimen is effective;  continue present plan and medications.  Only taking fish oil.  Healthy diet. Consider low dose statin if numbers increase.  Pacer: followed by Dr. Graciela Husbands. Recently adjusted and he feels better.  May have been having  diaphargamatic pacing but this resolved.  THoracic aortic aneurysm: 42 mm by echo in 01/2021.  Will need f/u imaging.    Current medicines are reviewed at length with the patient today.  The patient concerns regarding his medicines were addressed.  The following changes have been made:  No change  Labs/ tests ordered today include:  No orders of the defined types were placed in this encounter.   Recommend 150 minutes/week of aerobic exercise Low fat, low carb, high fiber diet recommended  Disposition:   FU  in 6 months   Signed, Lance Muss, MD  04/21/2021 4:32 PM    [facility] [address] [place], [place], [place]  [address] Phone: [phone]; Fax: [phone]

## 2021-04-21 ENCOUNTER — Ambulatory Visit: Payer: Medicare Other | Admitting: Interventional Cardiology

## 2021-04-21 ENCOUNTER — Encounter: Payer: Self-pay | Admitting: Interventional Cardiology

## 2021-04-21 ENCOUNTER — Other Ambulatory Visit: Payer: Self-pay

## 2021-04-21 VITALS — BP 132/84 | HR 78 | Ht 72.0 in | Wt 229.6 lb

## 2021-04-21 DIAGNOSIS — Q231 Congenital insufficiency of aortic valve: Secondary | ICD-10-CM

## 2021-04-21 DIAGNOSIS — Z952 Presence of prosthetic heart valve: Secondary | ICD-10-CM | POA: Diagnosis not present

## 2021-04-21 DIAGNOSIS — Z950 Presence of cardiac pacemaker: Secondary | ICD-10-CM | POA: Diagnosis not present

## 2021-04-21 DIAGNOSIS — I7121 Aneurysm of the ascending aorta, without rupture: Secondary | ICD-10-CM

## 2021-04-21 DIAGNOSIS — E782 Mixed hyperlipidemia: Secondary | ICD-10-CM

## 2021-04-21 DIAGNOSIS — Q2381 Bicuspid aortic valve: Secondary | ICD-10-CM

## 2021-04-21 DIAGNOSIS — I10 Essential (primary) hypertension: Secondary | ICD-10-CM

## 2021-04-21 NOTE — Patient Instructions (Signed)
Medication Instructions:  Your physician recommends that you continue on your current medications as directed. Please refer to the Current Medication list given to you today.  *If you need a refill on your cardiac medications before your next appointment, please call your pharmacy*   Lab Work: None If you have labs (blood work) drawn today and your tests are completely normal, you will receive your results only by: MyChart Message (if you have MyChart) OR A paper copy in the mail If you have any lab test that is abnormal or we need to change your treatment, we will call you to review the results.   Follow-Up: At [facility], you and your health needs are our priority.  As part of our continuing mission to provide you with exceptional heart care, we have created designated Provider Care Teams.  These Care Teams include your primary Cardiologist (physician) and Advanced Practice Providers (APPs -  Physician Assistants and Nurse Practitioners) who all work together to provide you with the care you need, when you need it.  Your next appointment:   5 month(s) (April 2023)  The format for your next appointment:   In Person  Provider:   Lance Muss, MD {

## 2021-05-23 ENCOUNTER — Other Ambulatory Visit: Payer: Self-pay | Admitting: Interventional Cardiology

## 2021-05-24 NOTE — Telephone Encounter (Addendum)
Prescription refill request for Eliquis received. Indication: Bicuspid Aortic Valve Last office visit:04/21/21 Eldridge Dace)  Scr: 1.13 (12/06/20)  Age: 80 Weight: 104.1kg  Appropriate dose and refill sent to requested pharmacy.

## 2021-05-29 ENCOUNTER — Encounter: Payer: Self-pay | Admitting: Physical Therapy

## 2021-05-29 ENCOUNTER — Ambulatory Visit: Payer: Medicare Other | Attending: Internal Medicine | Admitting: Physical Therapy

## 2021-05-29 ENCOUNTER — Other Ambulatory Visit: Payer: Self-pay

## 2021-05-29 DIAGNOSIS — H8111 Benign paroxysmal vertigo, right ear: Secondary | ICD-10-CM | POA: Diagnosis not present

## 2021-05-29 DIAGNOSIS — R42 Dizziness and giddiness: Secondary | ICD-10-CM | POA: Diagnosis present

## 2021-05-29 DIAGNOSIS — R2681 Unsteadiness on feet: Secondary | ICD-10-CM | POA: Diagnosis present

## 2021-05-29 NOTE — Therapy (Signed)
[facility] [address] [place], [place], [address] Phone: [phone]   Fax:  [phone]  Physical Therapy Evaluation  Patient Details  Name: Derek Washington MRN: [id number] Date of Birth: 12-01-1940 Referring Provider (PT): Josetta Huddle, MD   Encounter Date: 05/29/2021   PT End of Session - 05/29/21 2029     Visit Number 1    Number of Visits 5    Date for PT Re-Evaluation 07/03/21    Authorization Type [employer] Medicare    Progress Note Due on Visit 10    PT Start Time 1100    PT Stop Time 1145    PT Time Calculation (min) 45 min    Activity Tolerance Patient tolerated treatment well    Behavior During Therapy [facility] for tasks assessed/performed             Past Medical History:  Diagnosis Date   Bicuspid aortic valve    LAST ECHOCARDIOGRAM APPROXIMATELY 2016? MILD AORTIC REGURG   BPH (benign prostatic hyperplasia)    Hard of hearing    Heart murmur    High cholesterol    Hydrocele    Hypertension    Insomnia    Low back pain    RIGHT   Mild obesity    Nocturia    Right elbow tendonitis    Tinnitus     Past Surgical History:  Procedure Laterality Date   AORTIC VALVE REPLACEMENT N/A 12/01/2020   Procedure: AORTIC VALVE REPLACEMENT (AVR) USING INSPIRIS 25MM AORTIC VALVE;  Surgeon: Gaye Pollack, MD;  Location: [place] [facility];  Service: Open Heart Surgery;  Laterality: N/A;   HYDROCELE EXCISION  2017   DR. [name] 2017   JOINT REPLACEMENT     left total knee replacement      2007    PACEMAKER IMPLANT N/A 12/07/2020   Procedure: PACEMAKER IMPLANT;  Surgeon: Deboraha Sprang, MD;  Location: [place] [facility];  Service: Cardiovascular;  Laterality: N/A;   right knee total replacement      2011   RIGHT/LEFT HEART CATH AND CORONARY ANGIOGRAPHY N/A 11/03/2020   Procedure: RIGHT/LEFT HEART CATH AND CORONARY ANGIOGRAPHY;  Surgeon: Lorretta Harp, MD;  Location: [place];  Service:  Cardiovascular;  Laterality: N/A;   SCROTAL EXPLORATION N/A 12/26/2015   Procedure: SCROTUM EXPLORATION;  Surgeon: Raynelle Bring, MD;  [facility];  Service: Urology;  Laterality: N/A;  EXCISION OF LEFT EPIDIDYMAL CYST/SPERMATOCELE   TEE WITHOUT CARDIOVERSION N/A 12/01/2020   Procedure: TRANSESOPHAGEAL ECHOCARDIOGRAM (TEE);  Surgeon: Gaye Pollack, MD;  Location: [facility];  Service: Open Heart Surgery;  Laterality: N/A;   TONSILLECTOMY     VASECTOMY     1968    There were no vitals filed for this visit.    Subjective Assessment - 05/29/21 1107     Subjective Dizziness has been going on for over a year; started with a gurgling sound in his R ear.  Spontaneous onset after turning his head quickly in the car.  Feels it when he turns his head quickly in sitting or standing.  Has to be careful about how he moves due to feeling lightheaded.  Denies true spinning vertigo, nausea, vomiting, denies sudden loss of hearing, denied any concurrent infections.  Has had one episode of double vision 6 months ago but not related to the dizziness.  Has not seen ENT or had imaging related to these symptoms.  Had two falls prior to surgery in June  but no falls since then.    Pertinent History Bicuspid aortic valve replacement, HOH, heart murmur, high cholesterol, HTN, insomnia, LBP, mild obesity, R elbow tendonitis, tinnitus, L TKA, pacemaker implant, R TKA    Limitations Walking;Standing    Diagnostic tests None    Currently in Pain? No/denies                [facility] PT Assessment - 05/29/21 1115       Assessment   Medical Diagnosis BPPV    Referring Provider (PT) [name], MD    Onset Date/Surgical Date 05/24/21    Prior Therapy not for dizziness      Precautions   Precautions Other (comment);ICD/Pacemaker    Precaution Comments Bicuspid aortic valve replacement, HOH, heart murmur, high cholesterol, HTN, insomnia, LBP, mild obesity, R elbow tendonitis, tinnitus, L TKA, pacemaker implant, R  TKA      Balance Screen   Has the patient fallen in the past 6 months Yes    How many times? 2      Home Environment   Additional Comments Driving      Prior Function   Level of Independence Independent    Leisure Walking for exercise      Observation/Other Assessments   Focus on Therapeutic Outcomes (FOTO)  Staff did not capture today      Sensation   Light Touch Appears Intact      Coordination   Gross Motor Movements are Fluid and Coordinated Yes    Finger Nose Finger Test [facility]    Heel Shin Test WFL      ROM / Strength   AROM / PROM / Strength Strength      Strength   Overall Strength Within functional limits for tasks performed      Ambulation/Gait   Ambulation/Gait Yes    Ambulation/Gait Assistance 7: Independent    Ambulation Distance (Feet) 230 Feet    Assistive device None    Gait Pattern Within Functional Limits    Ambulation Surface Level;Indoor              Vestibular Assessment - 05/29/21 1118       Symptom Behavior   Subjective history of current problem Has improved over the weekend; Thursday/Friday was the last day of symptoms.  Tried the "maneuvers" this past week; did both sides.  Has been HOH and had tinnitus for multiple years    Type of Dizziness  Imbalance    Frequency of Dizziness Intermittent    Duration of Dizziness constant when standing and moving around    Symptom Nature Motion provoked    Aggravating Factors Turning body quickly;Turning head quickly    Relieving Factors Comments   sitting   Progression of Symptoms Better      Oculomotor Exam   Oculomotor Alignment Normal    Ocular ROM WFL    Spontaneous Absent    Gaze-induced  Absent    Smooth Pursuits Intact    Saccades Intact      Oculomotor Exam-Fixation Suppressed    Left Head Impulse negative   Positive with vertical HIT   Right Head Impulse negative      Vestibulo-Ocular Reflex   VOR to Slow Head Movement Normal    VOR Cancellation Normal      Visual Acuity    Static 7    Dynamic 5   mild dizziness after completing     Positional Testing   Dix-Hallpike Dix-Hallpike Right;Dix-Hallpike Left    Horizontal Canal  Testing Horizontal Canal Right;Horizontal Canal Left      Dix-Hallpike Right   Dix-Hallpike Right Duration 0    Dix-Hallpike Right Symptoms No nystagmus      Dix-Hallpike Left   Dix-Hallpike Left Duration 0    Dix-Hallpike Left Symptoms No nystagmus      Horizontal Canal Right   Horizontal Canal Right Duration 0    Horizontal Canal Right Symptoms Normal      Horizontal Canal Left   Horizontal Canal Left Duration 0    Horizontal Canal Left Symptoms Normal      Balancemaster   Balancemaster Comment MCTSIB: 30 seconds condition 1, 2, 3; 14 seconds condition 4      Positional Sensitivities   Sit to Supine No dizziness    Supine to Left Side No dizziness    Supine to Right Side No dizziness    Supine to Sitting No dizziness    Right Hallpike No dizziness    Up from Right Hallpike No dizziness    Up from Left Hallpike No dizziness    Nose to Right Knee No dizziness    Right Knee to Sitting No dizziness    Nose to Left Knee No dizziness    Left Knee to Sitting No dizziness    Head Turning x 5 No dizziness    Head Nodding x 5 No dizziness    Pivot Right in Standing No dizziness    Pivot Left in Standing No dizziness    Rolling Right No dizziness    Rolling Left No dizziness                Objective measurements completed on examination: See above findings.      PT Education - 05/29/21 2028     Education Details clinical findings, PT POC and goals.  No indication of ongoing BPPV today, likely resolved over the weekend with maneuvers; discussed treatment for balance impairments    Person(s) Educated Patient    Methods Explanation    Comprehension Verbalized understanding               PT Long Term Goals - 05/29/21 2036       PT LONG TERM GOAL #1   Title Pt will demonstrate independence with final  vestibular/balance HEP    Time 5    Period Weeks    Status New    Target Date 07/03/21      PT LONG TERM GOAL #2   Title Pt will continue to demonstrate resolution of BPPV and will demonstrate ability to perform self-CRM if symptoms return    Time 5    Period Weeks    Status New    Target Date 07/03/21      PT LONG TERM GOAL #3   Title Pt will demonstrate improved sensory integration for balance as indicated by ability to hold condition 4 x 25-30 seconds on MCTSIB    Baseline 14 seconds    Time 5    Period Weeks    Status New    Target Date 07/03/21      PT LONG TERM GOAL #4   Title Pt will improve FGA to >/= 28/30 to indicate decreased falls risk when ambulating in the community for exercise    Baseline TBA    Time 5    Period Weeks    Status New    Target Date 07/03/21  Plan - 05/29/21 2030     Clinical Impression Statement Pt is a 80 year old male referred to Neuro OPPT for evaluation of vertigo/suspected BPPV.  Pt's PMH is significant for the following: Bicuspid aortic valve replacement, HOH, heart murmur, high cholesterol, HTN, insomnia, LBP, mild obesity, R elbow tendonitis, tinnitus, L TKA, pacemaker implant, R TKA. The following deficits were noted during pt's exam: pt was negative for nystagmus and positional vertigo - pt reports performing CRM this past weekend with significant improvement in symptoms today which may indicate self-resolution of BPPV; pt continues to present with disequilibrium with quick head movements and impaired standing balance placing pt at increased risk for falls.  Pt would benefit from skilled PT to address these impairments and functional limitations to maximize functional mobility independence and reduce falls risk.    Personal Factors and Comorbidities Comorbidity 3+;Time since onset of injury/illness/exacerbation    Comorbidities Bicuspid aortic valve replacement, HOH, heart murmur, high cholesterol, HTN, insomnia,  LBP, mild obesity, R elbow tendonitis, tinnitus, L TKA, pacemaker implant, R TKA    Examination-Activity Limitations Locomotion Level;Stand    Examination-Participation Restrictions Community Activity    Stability/Clinical Decision Making Stable/Uncomplicated    Clinical Decision Making Low    Rehab Potential Good    PT Frequency 1x / week    PT Duration Other (comment)   5 weeks; one week missed due to holiday   PT Treatment/Interventions ADLs/Self Care Home Management;Canalith Repostioning;Functional mobility training;Therapeutic activities;Therapeutic exercise;Balance training;Neuromuscular re-education;Patient/family education;Vestibular    PT Next Visit Plan Check FGA; any return of dizziness?  if so, recheck positional testing and treat as indicated.  Start HEP: VOR in standing, balance on compliant surface, EC    Consulted and Agree with Plan of Care Patient             Patient will benefit from skilled therapeutic intervention in order to improve the following deficits and impairments:  Decreased balance, Dizziness  Visit Diagnosis: BPPV (benign paroxysmal positional vertigo), right  Dizziness and giddiness  Unsteadiness on feet     Problem List Patient Active Problem List   Diagnosis Date Noted   Heart block AV complete ([place]) 03/16/2021   Pacemaker - MDT 03/16/2021   S/P AVR (aortic valve replacement) 12/01/2020   Benign prostatic hyperplasia 11/16/2020   Chronic cough 11/16/2020   Chronic rhinitis 11/16/2020   Gastroesophageal reflux disease 11/16/2020   Hyperlipidemia 11/16/2020   Insomnia 11/16/2020   Lumbosacral spondylosis without myelopathy 11/16/2020   Hypertension, benign 11/16/2020   Vitamin D deficiency 11/16/2020   Bicuspid aortic valve 11/05/2018   Herpes simplex 09/24/2013   Near syncope 03/02/2013   IFG (impaired fasting glucose) 11/09/2011   Carotid bruit 03/09/2011   RBBB (right bundle branch block with left anterior fascicular block)  03/09/2011    Rico Junker, PT, DPT 05/29/21    8:41 PM   [facility] [address] [place], [place], [address] Phone: [phone]   Fax:  [phone]  Name: Meghan Faatz MRN: [id number] Date of Birth: 05/22/1941

## 2021-06-07 ENCOUNTER — Ambulatory Visit: Payer: Medicare Other | Admitting: Physical Therapy

## 2021-06-07 ENCOUNTER — Ambulatory Visit (INDEPENDENT_AMBULATORY_CARE_PROVIDER_SITE_OTHER): Payer: Medicare Other

## 2021-06-07 ENCOUNTER — Other Ambulatory Visit: Payer: Self-pay

## 2021-06-07 ENCOUNTER — Encounter: Payer: Self-pay | Admitting: Physical Therapy

## 2021-06-07 DIAGNOSIS — H8111 Benign paroxysmal vertigo, right ear: Secondary | ICD-10-CM | POA: Diagnosis not present

## 2021-06-07 DIAGNOSIS — I442 Atrioventricular block, complete: Secondary | ICD-10-CM | POA: Diagnosis not present

## 2021-06-07 DIAGNOSIS — R42 Dizziness and giddiness: Secondary | ICD-10-CM

## 2021-06-07 DIAGNOSIS — R2681 Unsteadiness on feet: Secondary | ICD-10-CM

## 2021-06-07 LAB — CUP PACEART REMOTE DEVICE CHECK
Battery Remaining Longevity: 147 mo
Battery Voltage: 3.17 V
Brady Statistic AP VP Percent: 42.61 %
Brady Statistic AP VS Percent: 0.01 %
Brady Statistic AS VP Percent: 55.64 %
Brady Statistic AS VS Percent: 1.74 %
Brady Statistic RA Percent Paced: 43.15 %
Brady Statistic RV Percent Paced: 98.25 %
Date Time Interrogation Session: 20221220215745
Implantable Lead Implant Date: 20220622
Implantable Lead Implant Date: 20220622
Implantable Lead Location: 753859
Implantable Lead Location: 753860
Implantable Lead Model: 3830
Implantable Lead Model: 5076
Implantable Pulse Generator Implant Date: 20220622
Lead Channel Impedance Value: 228 Ohm
Lead Channel Impedance Value: 399 Ohm
Lead Channel Impedance Value: 437 Ohm
Lead Channel Impedance Value: 570 Ohm
Lead Channel Pacing Threshold Amplitude: 0.5 V
Lead Channel Pacing Threshold Amplitude: 1 V
Lead Channel Pacing Threshold Pulse Width: 0.4 ms
Lead Channel Pacing Threshold Pulse Width: 0.4 ms
Lead Channel Sensing Intrinsic Amplitude: 10.75 mV
Lead Channel Sensing Intrinsic Amplitude: 10.75 mV
Lead Channel Sensing Intrinsic Amplitude: 2.5 mV
Lead Channel Sensing Intrinsic Amplitude: 2.5 mV
Lead Channel Setting Pacing Amplitude: 2 V
Lead Channel Setting Pacing Amplitude: 2 V
Lead Channel Setting Pacing Pulse Width: 0.4 ms
Lead Channel Setting Sensing Sensitivity: 0.9 mV

## 2021-06-07 NOTE — Patient Instructions (Signed)
Access Code: [email or web address] URL: [email or web address] Date: 06/07/2021 Prepared by: Sherlie Ban  Exercises Tandem Walking with Counter Support - 2 x daily - 5 x weekly - 3 sets Romberg Stance Eyes Closed on Foam Pad - 2 x daily - 5 x weekly - 3 sets - 30 hold Narrow Stance with Eyes Closed and Head Nods on Foam Pad - 2 x daily - 5 x weekly - 2 sets - 10 reps Standing Gaze Stabilization with Head Rotation - 2 x daily - 5 x weekly - 3 sets - 60 hold Standing Gaze Stabilization with Head Nod - 2 x daily - 5 x weekly - 3 sets - 60 hold

## 2021-06-07 NOTE — Therapy (Signed)
[facility] [address] [place], [place], [address] Phone: [phone]   Fax:  [phone]  Physical Therapy Treatment  Patient Details  Name: Derek Washington MRN: [id number] Date of Birth: 1940/07/16 Referring Provider (PT): Josetta Huddle, MD   Encounter Date: 06/07/2021   PT End of Session - 06/07/21 1615     Visit Number 2    Number of Visits 5    Date for PT Re-Evaluation 07/03/21    Authorization Type UHC Medicare    Progress Note Due on Visit 10    PT Start Time 1614    PT Stop Time 1654    PT Time Calculation (min) 40 min    Activity Tolerance Patient tolerated treatment well    Behavior During Therapy [facility] for tasks assessed/performed             Past Medical History:  Diagnosis Date   Bicuspid aortic valve    LAST ECHOCARDIOGRAM APPROXIMATELY 2016? MILD AORTIC REGURG   BPH (benign prostatic hyperplasia)    Hard of hearing    Heart murmur    High cholesterol    Hydrocele    Hypertension    Insomnia    Low back pain    RIGHT   Mild obesity    Nocturia    Right elbow tendonitis    Tinnitus     Past Surgical History:  Procedure Laterality Date   AORTIC VALVE REPLACEMENT N/A 12/01/2020   Procedure: AORTIC VALVE REPLACEMENT (AVR) USING INSPIRIS 25MM AORTIC VALVE;  Surgeon: Gaye Pollack, MD;  Location: [facility];  Service: Open Heart Surgery;  Laterality: N/A;   HYDROCELE EXCISION  2017   DR. [name] 2017   JOINT REPLACEMENT     left total knee replacement      2007    PACEMAKER IMPLANT N/A 12/07/2020   Procedure: PACEMAKER IMPLANT;  Surgeon: Deboraha Sprang, MD;  Location: [facility];  Service: Cardiovascular;  Laterality: N/A;   right knee total replacement      2011   RIGHT/LEFT HEART CATH AND CORONARY ANGIOGRAPHY N/A 11/03/2020   Procedure: RIGHT/LEFT HEART CATH AND CORONARY ANGIOGRAPHY;  Surgeon: Lorretta Harp, MD;  Location: [facility] [place];  Service:  Cardiovascular;  Laterality: N/A;   SCROTAL EXPLORATION N/A 12/26/2015   Procedure: SCROTUM EXPLORATION;  Surgeon: Raynelle Bring, MD;  Location[facility];  Service: Urology;  Laterality: N/A;  EXCISION OF LEFT EPIDIDYMAL CYST/SPERMATOCELE   TEE WITHOUT CARDIOVERSION N/A 12/01/2020   Procedure: TRANSESOPHAGEAL ECHOCARDIOGRAM (TEE);  Surgeon: Gaye Pollack, MD;  Location: [facility];  Service: Open Heart Surgery;  Laterality: N/A;   TONSILLECTOMY     VASECTOMY     1968    There were no vitals filed for this visit.   Subjective Assessment - 06/07/21 1616     Subjective No changes, no dizziness.    Pertinent History Bicuspid aortic valve replacement, HOH, heart murmur, high cholesterol, HTN, insomnia, LBP, mild obesity, R elbow tendonitis, tinnitus, L TKA, pacemaker implant, R TKA    Limitations Walking;Standing    Diagnostic tests None    Currently in Pain? No/denies                [facility] PT Assessment - 06/07/21 1617       Functional Gait  Assessment   Gait assessed  Yes    Gait Level Surface Walks 20 ft in less than 5.5 sec, no assistive devices, good speed, no evidence for  imbalance, normal gait pattern, deviates no more than 6 in outside of the 12 in walkway width.    Change in Gait Speed Able to smoothly change walking speed without loss of balance or gait deviation. Deviate no more than 6 in outside of the 12 in walkway width.    Gait with Horizontal Head Turns Performs head turns smoothly with slight change in gait velocity (eg, minor disruption to smooth gait path), deviates 6-10 in outside 12 in walkway width, or uses an assistive device.    Gait with Vertical Head Turns Performs head turns with no change in gait. Deviates no more than 6 in outside 12 in walkway width.    Gait and Pivot Turn Pivot turns safely within 3 sec and stops quickly with no loss of balance.    Step Over Obstacle Is able to step over 2 stacked shoe boxes taped together (9 in total height) without changing  gait speed. No evidence of imbalance.    Gait with Narrow Base of Support Ambulates 4-7 steps.   6 steps   Gait with Eyes Closed Walks 20 ft, slow speed, abnormal gait pattern, evidence for imbalance, deviates 10-15 in outside 12 in walkway width. Requires more than 9 sec to ambulate 20 ft.   goes to R   Ambulating Backwards Walks 20 ft, no assistive devices, good speed, no evidence for imbalance, normal gait    Steps Alternating feet, no rail.    Total Score 25    FGA comment: low fall risk                            Vestibular Treatment/Exercise - 06/07/21 1631       Vestibular Treatment/Exercise   Vestibular Treatment Provided Gaze    Gaze Exercises X1 Viewing Horizontal;X1 Viewing Vertical      X1 Viewing Horizontal   Foot Position Standing with feet apart    Comments first rep x30 seconds, 3 x 60 seconds (last two reps to incr speed)      X1 Viewing Vertical   Foot Position Standing with feet apart    Comments first rep x30 seconds - mild lightheadedness that subsided quickly. 2x 60 seconds             Access Code: [email or web address] URL: [email or web address] Date: 06/07/2021 Prepared by: Sherlie Ban  Initiated HEP- see MedBridge for further details.   Exercises Tandem Walking with Counter Support - 2 x daily - 5 x weekly - 3 sets Romberg Stance Eyes Closed on Foam Pad - 2 x daily - 5 x weekly - 3 sets - 30 hold Narrow Stance with Eyes Closed and Head Nods on Foam Pad - 2 x daily - 5 x weekly - 2 sets - 10 reps - with slight space between feet, 2 x 10 reps head turns, 2 x 10 reps head nods. Incr difficulty with head nods.  Standing Gaze Stabilization with Head Rotation - 2 x daily - 5 x weekly - 3 sets - 60 hold Standing Gaze Stabilization with Head Nod - 2 x daily - 5 x weekly - 3 sets - 60 hold       PT Education - 06/07/21 1702     Education Details Initial HEP, results of FGA.    Person(s) Educated Patient    Methods  Explanation;Demonstration;Handout    Comprehension Verbalized understanding;Returned demonstration  PT Long Term Goals - 06/07/21 1627       PT LONG TERM GOAL #1   Title Pt will demonstrate independence with final vestibular/balance HEP    Time 5    Period Weeks    Status New    Target Date 07/03/21      PT LONG TERM GOAL #2   Title Pt will continue to demonstrate resolution of BPPV and will demonstrate ability to perform self-CRM if symptoms return    Time 5    Period Weeks    Status New    Target Date 07/03/21      PT LONG TERM GOAL #3   Title Pt will demonstrate improved sensory integration for balance as indicated by ability to hold condition 4 x 25-30 seconds on MCTSIB    Baseline 14 seconds    Time 5    Period Weeks    Status New    Target Date 07/03/21      PT LONG TERM GOAL #4   Title Pt will improve FGA to >/= 28/30 to indicate decreased falls risk when ambulating in the community for exercise    Baseline 25/30 on 06/07/21    Time 5    Period Weeks    Status Revised    Target Date 07/03/21                   Plan - 06/07/21 1704     Clinical Impression Statement Performed the FGA with pt scoring a 25/30, indicating pt is at a low fall risk. Pt most challenged by tandem, head turns, and eyes closed. LTG update as appropriate. Remainder of session focused on initiating HEP for standing VOR, incr vestibular input for balance on compliant surfaces, and tandem gait. Pt tolerated session well, will conitnue to progress towards LTGs.    Personal Factors and Comorbidities Comorbidity 3+;Time since onset of injury/illness/exacerbation    Comorbidities Bicuspid aortic valve replacement, HOH, heart murmur, high cholesterol, HTN, insomnia, LBP, mild obesity, R elbow tendonitis, tinnitus, L TKA, pacemaker implant, R TKA    Examination-Activity Limitations Locomotion Level;Stand    Examination-Participation Restrictions Community Activity     Stability/Clinical Decision Making Stable/Uncomplicated    Rehab Potential Good    PT Frequency 1x / week    PT Duration Other (comment)   5 weeks; one week missed due to holiday   PT Treatment/Interventions ADLs/Self Care Home Management;Canalith Repostioning;Functional mobility training;Therapeutic activities;Therapeutic exercise;Balance training;Neuromuscular re-education;Patient/family education;Vestibular    PT Next Visit Plan progress VOR in standing, balance on compliant surface, EC, tandem balance/narrow BOS. Head turns.    PT Home Exercise Plan [email or web address]    Consulted and Agree with Plan of Care Patient             Patient will benefit from skilled therapeutic intervention in order to improve the following deficits and impairments:  Decreased balance, Dizziness  Visit Diagnosis: Dizziness and giddiness  Unsteadiness on feet     Problem List Patient Active Problem List   Diagnosis Date Noted   Heart block AV complete ([place]) 03/16/2021   Pacemaker - MDT 03/16/2021   S/P AVR (aortic valve replacement) 12/01/2020   Benign prostatic hyperplasia 11/16/2020   Chronic cough 11/16/2020   Chronic rhinitis 11/16/2020   Gastroesophageal reflux disease 11/16/2020   Hyperlipidemia 11/16/2020   Insomnia 11/16/2020   Lumbosacral spondylosis without myelopathy 11/16/2020   Hypertension, benign 11/16/2020   Vitamin D deficiency 11/16/2020   Bicuspid aortic valve 11/05/2018   Herpes simplex  09/24/2013   Near syncope 03/02/2013   IFG (impaired fasting glucose) 11/09/2011   Carotid bruit 03/09/2011   RBBB (right bundle branch block with left anterior fascicular block) 03/09/2011    Arliss Journey, PT, DPT  06/07/2021, 5:06 PM  [facility] [address] [place], [place], [address] Phone: [phone]   Fax:  [phone]  Name: Derek Washington MRN: [id number] Date of Birth: [birth year]

## 2021-06-16 NOTE — Progress Notes (Signed)
Remote pacemaker transmission.   

## 2021-06-23 ENCOUNTER — Other Ambulatory Visit: Payer: Self-pay

## 2021-06-23 ENCOUNTER — Ambulatory Visit: Payer: Medicare Other | Attending: Internal Medicine | Admitting: Physical Therapy

## 2021-06-23 DIAGNOSIS — H8111 Benign paroxysmal vertigo, right ear: Secondary | ICD-10-CM | POA: Diagnosis present

## 2021-06-23 DIAGNOSIS — R42 Dizziness and giddiness: Secondary | ICD-10-CM | POA: Diagnosis present

## 2021-06-23 DIAGNOSIS — R2681 Unsteadiness on feet: Secondary | ICD-10-CM | POA: Diagnosis present

## 2021-06-23 NOTE — Patient Instructions (Addendum)
Gaze Stabilization: Standing Feet Together (Compliant Surface)    Feet together on pillow, keeping eyes on target on wall __3__ feet away, tilt head down 15-30 and move head side to side for __60__ seconds. Repeat while moving head up and down for __60__ seconds. Do __2-3__ sessions per day.   Access Code: [email or web address] URL: [email or web address] Date: 06/23/2021 Prepared by: Bufford Lope  Exercises Tandem Walking with Counter Support - 2 x daily - 5 x weekly - 3 sets Narrow Stance with Eyes Closed and Head Nods on Foam Pad - 2 x daily - 5 x weekly - 2 sets - 10 reps Narrow Stance with Eyes Closed and Head Rotation on Foam Pad - 2 x daily - 5 x weekly - 2 sets - 10 reps Standing Single Leg Hip Flexion Extension - 1 x daily - 7 x weekly - 2 sets - 10 reps

## 2021-06-23 NOTE — Therapy (Signed)
[facility] [address] [place], [place], [address] Phone: [phone]   Fax:  [phone]  Physical Therapy Treatment  Patient Details  Name: Derek Washington MRN: [id number] Date of Birth: [birth year] Referring Provider (Derek Washington): Josetta Huddle, MD   Encounter Date: 06/23/2021   Derek Washington End of Session - 06/23/21 1526     Visit Number 3    Number of Visits 5    Date for Derek Washington Re-Evaluation 07/03/21    Authorization Type [employer] Medicare    Progress Note Due on Visit 10    Derek Washington Start Time 1315    Derek Washington Stop Time 1355    Derek Washington Time Calculation (min) 40 min    Activity Tolerance Patient tolerated treatment well    Behavior During Therapy [facility] for tasks assessed/performed             Past Medical History:  Diagnosis Date   Bicuspid aortic valve    LAST ECHOCARDIOGRAM APPROXIMATELY 2016? MILD AORTIC REGURG   BPH (benign prostatic hyperplasia)    Hard of hearing    Heart murmur    High cholesterol    Hydrocele    Hypertension    Insomnia    Low back pain    RIGHT   Mild obesity    Nocturia    Right elbow tendonitis    Tinnitus     Past Surgical History:  Procedure Laterality Date   AORTIC VALVE REPLACEMENT N/A 12/01/2020   Procedure: AORTIC VALVE REPLACEMENT (AVR) USING INSPIRIS 25MM AORTIC VALVE;  Surgeon: Gaye Pollack, MD;  Location: [place] [facility];  Service: Open Heart Surgery;  Laterality: N/A;   HYDROCELE EXCISION  2017   DR. [name] 2017   JOINT REPLACEMENT     left total knee replacement      2007    PACEMAKER IMPLANT N/A 12/07/2020   Procedure: PACEMAKER IMPLANT;  Surgeon: Deboraha Sprang, MD;  Location: [place] [facility];  Service: Cardiovascular;  Laterality: N/A;   right knee total replacement      2011   RIGHT/LEFT HEART CATH AND CORONARY ANGIOGRAPHY N/A 11/03/2020   Procedure: RIGHT/LEFT HEART CATH AND CORONARY ANGIOGRAPHY;  Surgeon: Lorretta Harp, MD;  Location: [place] [facility];  Service: Cardiovascular;   Laterality: N/A;   SCROTAL EXPLORATION N/A 12/26/2015   Procedure: SCROTUM EXPLORATION;  Surgeon: Raynelle Bring, MD;  Location: [facility];  Service: Urology;  Laterality: N/A;  EXCISION OF LEFT EPIDIDYMAL CYST/SPERMATOCELE   TEE WITHOUT CARDIOVERSION N/A 12/01/2020   Procedure: TRANSESOPHAGEAL ECHOCARDIOGRAM (TEE);  Surgeon: Gaye Pollack, MD;  Location: [place];  Service: Open Heart Surgery;  Laterality: N/A;   TONSILLECTOMY     VASECTOMY     1968    There were no vitals filed for this visit.   Subjective Assessment - 06/23/21 1323     Subjective Had another little spell of dizziness but did the manuever again and symptoms resolved.  Has been doing the exercises a couple times a day without difficulty.  Got up quickly from a chair and was a little off balance but it settled quickly.    Pertinent History Bicuspid aortic valve replacement, HOH, heart murmur, high cholesterol, HTN, insomnia, LBP, mild obesity, R elbow tendonitis, tinnitus, L TKA, pacemaker implant, R TKA    Limitations Walking;Standing    Diagnostic tests None    Currently in Pain? No/denies               Vestibular Treatment/Exercise - 06/23/21 1329  Vestibular Treatment/Exercise   Vestibular Treatment Provided Gaze    Gaze Exercises X1 Viewing Horizontal;X1 Viewing Vertical      X1 Viewing Horizontal   Foot Position feet together solid surface, feet together compliant surface    Comments 60 seconds no dizziness      X1 Viewing Vertical   Foot Position feet together solid surface, feet together compliant surface    Comments 60 seconds no dizziness              Balance Exercises - 06/23/21 1523       Balance Exercises: Standing   Standing Eyes Closed Narrow base of support (BOS);Head turns;Foam/compliant surface;Other reps (comment);Limitations    Standing Eyes Closed Limitations holding balance x 30 seconds and then with 10 reps head turns, 10 reps head nods.  Greater imbalance with head nods     SLS Eyes open;Solid surface;Intermittent upper extremity support;Limitations    SLS Limitations 10 reps each side, SLS with contralateral LE lifting slowly and stepping forwards <> back slowly for more dynamic control in single limb stance    Tandem Gait Forward;Intermittent upper extremity support;4 reps;Limitations    Tandem Gait Limitations performed with intermittent UE support on counter.  Derek Washington performing quickly at home.  Cued Derek Washington to hold each step forwards x 2 seconds before progressing forwards               Derek Washington Education - 06/23/21 1526     Education Details updated HEP    Person(s) Educated Patient    Methods Explanation;Demonstration;Handout    Comprehension Verbalized understanding;Returned demonstration               Derek Washington Long Term Goals - 06/07/21 1627       Derek Washington LONG TERM GOAL #1   Title Derek Washington will demonstrate independence with final vestibular/balance HEP    Time 5    Period Weeks    Status New    Target Date 07/03/21      Derek Washington LONG TERM GOAL #2   Title Derek Washington will continue to demonstrate resolution of BPPV and will demonstrate ability to perform self-CRM if symptoms return    Time 5    Period Weeks    Status New    Target Date 07/03/21      Derek Washington LONG TERM GOAL #3   Title Derek Washington will demonstrate improved sensory integration for balance as indicated by ability to hold condition 4 x 25-30 seconds on MCTSIB    Baseline 14 seconds    Time 5    Period Weeks    Status New    Target Date 07/03/21      Derek Washington LONG TERM GOAL #4   Title Derek Washington will improve FGA to >/= 28/30 to indicate decreased falls risk when ambulating in the community for exercise    Baseline 25/30 on 06/07/21    Time 5    Period Weeks    Status Revised    Target Date 07/03/21                   Plan - 06/23/21 1527     Clinical Impression Statement Derek Washington continues to be able to perform self-CRM at home successfully to treat vertigo.  Derek Washington today focused on progressing HEP and providing cues to slow down  patient movement to improve postural control and balance reactions.  If symptoms continue to be stable, anticipate Derek Washington will be able to D/C from therapy in the next two visits.    Personal  Factors and Comorbidities Comorbidity 3+;Time since onset of injury/illness/exacerbation    Comorbidities Bicuspid aortic valve replacement, HOH, heart murmur, high cholesterol, HTN, insomnia, LBP, mild obesity, R elbow tendonitis, tinnitus, L TKA, pacemaker implant, R TKA    Examination-Activity Limitations Locomotion Level;Stand    Examination-Participation Restrictions Community Activity    Stability/Clinical Decision Making Stable/Uncomplicated    Rehab Potential Good    Derek Washington Frequency 1x / week    Derek Washington Duration Other (comment)   5 weeks; one week missed due to holiday   Derek Washington Treatment/Interventions ADLs/Self Care Home Management;Canalith Repostioning;Functional mobility training;Therapeutic activities;Therapeutic exercise;Balance training;Neuromuscular re-education;Patient/family education;Vestibular    Derek Washington Next Visit Plan Continue to progress VOR in standing on compliant surface, balance with eyes closed, balance beam    Derek Washington Home Exercise Plan [id number]    Consulted and Agree with Plan of Care Patient             Patient will benefit from skilled therapeutic intervention in order to improve the following deficits and impairments:  Decreased balance, Dizziness  Visit Diagnosis: Dizziness and giddiness  Unsteadiness on feet     Problem List Patient Active Problem List   Diagnosis Date Noted   Heart block AV complete ([place]) 03/16/2021   Pacemaker - MDT 03/16/2021   S/P AVR (aortic valve replacement) 12/01/2020   Benign prostatic hyperplasia 11/16/2020   Chronic cough 11/16/2020   Chronic rhinitis 11/16/2020   Gastroesophageal reflux disease 11/16/2020   Hyperlipidemia 11/16/2020   Insomnia 11/16/2020   Lumbosacral spondylosis without myelopathy 11/16/2020   Hypertension, benign 11/16/2020    Vitamin D deficiency 11/16/2020   Bicuspid aortic valve 11/05/2018   Herpes simplex 09/24/2013   Near syncope 03/02/2013   IFG (impaired fasting glucose) 11/09/2011   Carotid bruit 03/09/2011   RBBB (right bundle branch block with left anterior fascicular block) 03/09/2011   Derek Washington, Derek Washington, Derek Washington 06/23/21    3:31 PM    [facility] [address] [place], [place], [address] Phone: [phone]   Fax:  [phone]  Name: Derek Washington MRN: [id number] Date of Birth: 10-26-1940

## 2021-06-30 ENCOUNTER — Ambulatory Visit: Payer: Medicare Other | Admitting: Physical Therapy

## 2021-06-30 ENCOUNTER — Other Ambulatory Visit: Payer: Self-pay

## 2021-06-30 DIAGNOSIS — R42 Dizziness and giddiness: Secondary | ICD-10-CM

## 2021-06-30 DIAGNOSIS — R2681 Unsteadiness on feet: Secondary | ICD-10-CM

## 2021-06-30 DIAGNOSIS — H8111 Benign paroxysmal vertigo, right ear: Secondary | ICD-10-CM

## 2021-06-30 NOTE — Therapy (Signed)
[facility] [address] [place], [place], [address] Phone: [phone]   Fax:  [phone]  Physical Therapy Treatment and D/C Summary  Patient Details  Name: Derek Washington MRN: [id number] Date of Birth: 12/30/1940 Referring Provider (PT): Josetta Huddle, MD   Encounter Date: 06/30/2021   PT End of Session - 06/30/21 1216     Visit Number 4    Number of Visits 5    Date for PT Re-Evaluation 07/03/21    Authorization Type [employer]HC Medicare    Progress Note Due on Visit 10    PT Start Time 1148    PT Stop Time 1210    PT Time Calculation (min) 22 min    Activity Tolerance Patient tolerated treatment well    Behavior During Therapy [facility] for tasks assessed/performed             Past Medical History:  Diagnosis Date   Bicuspid aortic valve    LAST ECHOCARDIOGRAM APPROXIMATELY 2016? MILD AORTIC REGURG   BPH (benign prostatic hyperplasia)    Hard of hearing    Heart murmur    High cholesterol    Hydrocele    Hypertension    Insomnia    Low back pain    RIGHT   Mild obesity    Nocturia    Right elbow tendonitis    Tinnitus     Past Surgical History:  Procedure Laterality Date   AORTIC VALVE REPLACEMENT N/A 12/01/2020   Procedure: AORTIC VALVE REPLACEMENT (AVR) USING INSPIRIS 25MM AORTIC VALVE;  Surgeon: Gaye Pollack, MD;  Location: [place] OR;  Service: Open Heart Surgery;  Laterality: N/A;   HYDROCELE EXCISION  2017   DR. [name] 2017   JOINT REPLACEMENT     left total knee replacement      2007    PACEMAKER IMPLANT N/A 12/07/2020   Procedure: PACEMAKER IMPLANT;  Surgeon: Deboraha Sprang, MD;  Location: [place] [facility];  Service: Cardiovascular;  Laterality: N/A;   right knee total replacement      2011   RIGHT/LEFT HEART CATH AND CORONARY ANGIOGRAPHY N/A 11/03/2020   Procedure: RIGHT/LEFT HEART CATH AND CORONARY ANGIOGRAPHY;  Surgeon: Lorretta Harp, MD;  Location: [place] [facility];  Service:  Cardiovascular;  Laterality: N/A;   SCROTAL EXPLORATION N/A 12/26/2015   Procedure: SCROTUM EXPLORATION;  Surgeon: Raynelle Bring, MD;  Location[facility];  Service: Urology;  Laterality: N/A;  EXCISION OF LEFT EPIDIDYMAL CYST/SPERMATOCELE   TEE WITHOUT CARDIOVERSION N/A 12/01/2020   Procedure: TRANSESOPHAGEAL ECHOCARDIOGRAM (TEE);  Surgeon: Gaye Pollack, MD;  Location: [place];  Service: Open Heart Surgery;  Laterality: N/A;   TONSILLECTOMY     VASECTOMY     1968    There were no vitals filed for this visit.   Subjective Assessment - 06/30/21 1153     Subjective No episodes of dizziness and has not had to do the manuever this week.  Exercises are going well, no symptoms when performing.    Pertinent History Bicuspid aortic valve replacement, HOH, heart murmur, high cholesterol, HTN, insomnia, LBP, mild obesity, R elbow tendonitis, tinnitus, L TKA, pacemaker implant, R TKA    Limitations Walking;Standing    Diagnostic tests None    Currently in Pain? No/denies                [facility] PT Assessment - 06/30/21 1155       Assessment   Medical Diagnosis BPPV    Referring  Provider (PT) Josetta Huddle, MD    Onset Date/Surgical Date 05/24/21      Functional Gait  Assessment   Gait assessed  Yes    Gait Level Surface Walks 20 ft in less than 5.5 sec, no assistive devices, good speed, no evidence for imbalance, normal gait pattern, deviates no more than 6 in outside of the 12 in walkway width.    Change in Gait Speed Able to smoothly change walking speed without loss of balance or gait deviation. Deviate no more than 6 in outside of the 12 in walkway width.    Gait with Horizontal Head Turns Performs head turns smoothly with no change in gait. Deviates no more than 6 in outside 12 in walkway width    Gait with Vertical Head Turns Performs head turns with no change in gait. Deviates no more than 6 in outside 12 in walkway width.    Gait and Pivot Turn Pivot turns safely within 3 sec and  stops quickly with no loss of balance.    Step Over Obstacle Is able to step over 2 stacked shoe boxes taped together (9 in total height) without changing gait speed. No evidence of imbalance.    Gait with Narrow Base of Support Is able to ambulate for 10 steps heel to toe with no staggering.    Gait with Eyes Closed Walks 20 ft, no assistive devices, good speed, no evidence of imbalance, normal gait pattern, deviates no more than 6 in outside 12 in walkway width. Ambulates 20 ft in less than 7 sec.    Ambulating Backwards Walks 20 ft, no assistive devices, good speed, no evidence for imbalance, normal gait    Steps Alternating feet, no rail.    Total Score 30    FGA comment: 30/30                 Vestibular Assessment - 06/30/21 1205       Positional Testing   Dix-Hallpike Dix-Hallpike Right;Dix-Hallpike Left    Horizontal Canal Testing Horizontal Canal Right;Horizontal Canal Left      Dix-Hallpike Right   Dix-Hallpike Right Duration 0    Dix-Hallpike Right Symptoms No nystagmus      Dix-Hallpike Left   Dix-Hallpike Left Duration 0    Dix-Hallpike Left Symptoms No nystagmus      Horizontal Canal Right   Horizontal Canal Right Duration 0    Horizontal Canal Right Symptoms Normal      Horizontal Canal Left   Horizontal Canal Left Duration 0    Horizontal Canal Left Symptoms Normal      Balancemaster   Balancemaster Comment M-CTSIB: 30 seconds for all 4 conditions                PT Education - 06/30/21 1216     Education Details progress towards goals, all goals met, D/C from PT today    Person(s) Educated Patient    Methods Explanation    Comprehension Verbalized understanding                 PT Long Term Goals - 06/30/21 1209       PT LONG TERM GOAL #1   Title Pt will demonstrate independence with final vestibular/balance HEP    Time 5    Period Weeks    Status Achieved    Target Date 07/03/21      PT LONG TERM GOAL #2   Title Pt will  continue to demonstrate resolution of BPPV and  will demonstrate ability to perform self-CRM if symptoms return    Time 5    Period Weeks    Status Achieved    Target Date 07/03/21      PT LONG TERM GOAL #3   Title Pt will demonstrate improved sensory integration for balance as indicated by ability to hold condition 4 x 25-30 seconds on MCTSIB    Baseline 14 seconds > 30 seconds    Time 5    Period Weeks    Status Achieved    Target Date 07/03/21      PT LONG TERM GOAL #4   Title Pt will improve FGA to >/= 28/30 to indicate decreased falls risk when ambulating in the community for exercise    Baseline 25/30 on 06/07/21 > 30/30    Time 5    Period Weeks    Status Achieved    Target Date 07/03/21                   Plan - 06/30/21 1216     Clinical Impression Statement Pt has made excellent progress and has met all LTG.  Pt demonstrates full resolution of BPPV/vertigo and no longer demonstrates any motion sensitivity.  Pt also demonstrates improved sensory integration and low falls risk when performing more dynamic challenges.  Pt is safe and ready for D/C.  Discussed indications for returning to therapy.  Pt verbalized agreement with D/C today.    Personal Factors and Comorbidities Comorbidity 3+;Time since onset of injury/illness/exacerbation    Comorbidities Bicuspid aortic valve replacement, HOH, heart murmur, high cholesterol, HTN, insomnia, LBP, mild obesity, R elbow tendonitis, tinnitus, L TKA, pacemaker implant, R TKA    Examination-Activity Limitations Locomotion Level;Stand    Examination-Participation Restrictions Community Activity    Stability/Clinical Decision Making Stable/Uncomplicated    Rehab Potential Good    PT Frequency 1x / week    PT Duration Other (comment)   5 weeks; one week missed due to holiday   PT Treatment/Interventions ADLs/Self Care Home Management;Canalith Repostioning;Functional mobility training;Therapeutic activities;Therapeutic  exercise;Balance training;Neuromuscular re-education;Patient/family education;Vestibular    PT Next Visit Plan Continue to progress VOR in standing on compliant surface, balance with eyes closed, balance beam    PT Home Exercise Plan [id number]    Consulted and Agree with Plan of Care Patient             Patient will benefit from skilled therapeutic intervention in order to improve the following deficits and impairments:  Decreased balance, Dizziness  Visit Diagnosis: Dizziness and giddiness  Unsteadiness on feet  BPPV (benign paroxysmal positional vertigo), right     Problem List Patient Active Problem List   Diagnosis Date Noted   Heart block AV complete ([place]) 03/16/2021   Pacemaker - MDT 03/16/2021   S/P AVR (aortic valve replacement) 12/01/2020   Benign prostatic hyperplasia 11/16/2020   Chronic cough 11/16/2020   Chronic rhinitis 11/16/2020   Gastroesophageal reflux disease 11/16/2020   Hyperlipidemia 11/16/2020   Insomnia 11/16/2020   Lumbosacral spondylosis without myelopathy 11/16/2020   Hypertension, benign 11/16/2020   Vitamin D deficiency 11/16/2020   Bicuspid aortic valve 11/05/2018   Herpes simplex 09/24/2013   Near syncope 03/02/2013   IFG (impaired fasting glucose) 11/09/2011   Carotid bruit 03/09/2011   RBBB (right bundle branch block with left anterior fascicular block) 03/09/2011    PHYSICAL THERAPY DISCHARGE SUMMARY  Visits from Start of Care: 4  Current functional level related to goals / functional outcomes: Please see impression  statement and LTG achievement above; all goal met   Remaining deficits: None   Education / Equipment: HEP, home CRM   Patient agrees to discharge. Patient goals were met. Patient is being discharged due to meeting the stated rehab goals.  Derek Washington, PT, DPT 06/30/21    12:21 PM   [facility] [address] [place], [place], [address] Phone:  [phone]   Fax:  [phone]  Name: Derek Washington MRN: [id number] Date of Birth: November 23, 1940

## 2021-07-07 ENCOUNTER — Ambulatory Visit: Payer: Medicare Other | Admitting: Physical Therapy

## 2021-09-06 ENCOUNTER — Ambulatory Visit (INDEPENDENT_AMBULATORY_CARE_PROVIDER_SITE_OTHER): Payer: Medicare Other

## 2021-09-06 DIAGNOSIS — I442 Atrioventricular block, complete: Secondary | ICD-10-CM

## 2021-09-07 LAB — CUP PACEART REMOTE DEVICE CHECK
Battery Remaining Longevity: 140 mo
Battery Voltage: 3.11 V
Brady Statistic AP VP Percent: 48.68 %
Brady Statistic AP VS Percent: 0.05 %
Brady Statistic AS VP Percent: 49.65 %
Brady Statistic AS VS Percent: 1.62 %
Brady Statistic RA Percent Paced: 49.47 %
Brady Statistic RV Percent Paced: 98.33 %
Date Time Interrogation Session: 20230321211744
Implantable Lead Implant Date: 20220622
Implantable Lead Implant Date: 20220622
Implantable Lead Location: 753859
Implantable Lead Location: 753860
Implantable Lead Model: 3830
Implantable Lead Model: 5076
Implantable Pulse Generator Implant Date: 20220622
Lead Channel Impedance Value: 228 Ohm
Lead Channel Impedance Value: 437 Ohm
Lead Channel Impedance Value: 456 Ohm
Lead Channel Impedance Value: 589 Ohm
Lead Channel Pacing Threshold Amplitude: 0.5 V
Lead Channel Pacing Threshold Amplitude: 1.125 V
Lead Channel Pacing Threshold Pulse Width: 0.4 ms
Lead Channel Pacing Threshold Pulse Width: 0.4 ms
Lead Channel Sensing Intrinsic Amplitude: 1.875 mV
Lead Channel Sensing Intrinsic Amplitude: 1.875 mV
Lead Channel Sensing Intrinsic Amplitude: 12.375 mV
Lead Channel Sensing Intrinsic Amplitude: 12.375 mV
Lead Channel Setting Pacing Amplitude: 2 V
Lead Channel Setting Pacing Amplitude: 2.25 V
Lead Channel Setting Pacing Pulse Width: 0.4 ms
Lead Channel Setting Sensing Sensitivity: 0.9 mV

## 2021-09-20 NOTE — Progress Notes (Signed)
Remote pacemaker transmission.   

## 2021-09-21 ENCOUNTER — Ambulatory Visit: Payer: Medicare Other | Admitting: Interventional Cardiology

## 2021-10-02 ENCOUNTER — Encounter: Payer: Self-pay | Admitting: Interventional Cardiology

## 2021-10-02 NOTE — Telephone Encounter (Signed)
THere was no significant CAD on his cath in 2022 so that is reassuring.  COntinue to monitor and let us know if sx get worse.    ? ?[name], MD  You 4 minutes ago (3:27 PM)  ? ?Does the pain go away even if he keeps walking or doing his activity?  Has he had to stop any activity due to the pain?   ? ?

## 2021-10-16 NOTE — Progress Notes (Signed)
?  ?Cardiology Office Note ? ? ?Date:  10/17/2021  ? ?ID:  Derek Washington, DOB Oct 25, 1940, MRN [id number] ? ?PCP:  Josetta Huddle, MD  ? ? ?No chief complaint on file. ? ?S/p AVR ? ?Wt Readings from Last 3 Encounters:  ?10/17/21 228 lb (103.4 kg)  ?04/21/21 229 lb 9.6 oz (104.1 kg)  ?03/21/21 228 lb 9.6 oz (103.7 kg)  ?  ? ?  ?History of Present Illness: ?Derek Washington is a 81 y.o. male    with a history of bicuspid aortic valve, aortic stenosis and aortic regurgitation.  ?  ?He moved to [place] from [place]. ?  ?  ?He reported more shortness of breath in 2022. Echo showed Aortic valve Mean gradient increased to 36 mm.  Ao root was 44.  Had some near syncope at that time.  He eventually had cath and AVR.  Due to persistent bradycardia, he had a pacer placed.  He had atrial flutter as well.  Eliquis was started due to high burden of AFib.  ?In 04/2021: "Had some dizziness with turning his head a certain way.  He feels like the room is spinning.  Stamina has improved greatly.  He thinks the aortic stenosis was likely bothering him for several months prior to his surgery but he just avoided activities that cause symptoms.  Now he is walking up hills without any difficulty." ? ?Still does Epley maneuvers.   ? ?Had a chest ache, it was pulsastile.  He could press on his chest and reproduce the pain.  It resolved after 2 days.  He stays active.  He is back to doing all of his regular activities.   ? ?Denies : Chest pain. Dizziness. Leg edema. Nitroglycerin use. Orthopnea. Palpitations. Paroxysmal nocturnal dyspnea. Shortness of breath. Syncope.   ? ?Past Medical History:  ?Diagnosis Date  ? Bicuspid aortic valve   ? LAST ECHOCARDIOGRAM APPROXIMATELY 2016? MILD AORTIC REGURG  ? BPH (benign prostatic hyperplasia)   ? Hard of hearing   ? Heart murmur   ? High cholesterol   ? Hydrocele   ? Hypertension   ? Insomnia   ? Low back pain   ? RIGHT  ? Mild obesity   ? Nocturia   ? Right elbow tendonitis   ? Tinnitus   ? ? ?Past Surgical  History:  ?Procedure Laterality Date  ? AORTIC VALVE REPLACEMENT N/A 12/01/2020  ? Procedure: AORTIC VALVE REPLACEMENT (AVR) USING INSPIRIS 25MM AORTIC VALVE;  Surgeon: Gaye Pollack, MD;  Location: [facility];  Service: Open Heart Surgery;  Laterality: N/A;  ? HYDROCELE EXCISION  2017  ? DR. Raynelle Bring 2017  ? JOINT REPLACEMENT    ? left total knee replacement     ? 2007   ? PACEMAKER IMPLANT N/A 12/07/2020  ? Procedure: PACEMAKER IMPLANT;  Surgeon: Deboraha Sprang, MD;  Location: [facility];  Service: Cardiovascular;  Laterality: N/A;  ? right knee total replacement     ? 2011  ? RIGHT/LEFT HEART CATH AND CORONARY ANGIOGRAPHY N/A 11/03/2020  ? Procedure: RIGHT/LEFT HEART CATH AND CORONARY ANGIOGRAPHY;  Surgeon: Lorretta Harp, MD;  Location: [facility];  Service: Cardiovascular;  Laterality: N/A;  ? SCROTAL EXPLORATION N/A 12/26/2015  ? Procedure: SCROTUM EXPLORATION;  Surgeon: Raynelle Bring, MD;  Location: [facility];  Service: Urology;  Laterality: N/A;  EXCISION OF LEFT EPIDIDYMAL CYST/SPERMATOCELE  ? TEE WITHOUT CARDIOVERSION N/A 12/01/2020  ? Procedure: TRANSESOPHAGEAL ECHOCARDIOGRAM (TEE);  Surgeon: Gaye Pollack, MD;  [facility];  Service: Open Heart Surgery;  Laterality: N/A;  ? TONSILLECTOMY    ? VASECTOMY    ? 1968  ? ? ? ?Current Outpatient Medications  ?Medication Sig Dispense Refill  ? amLODipine (NORVASC) 10 MG tablet Take 10 mg by mouth every evening.    ? atorvastatin (LIPITOR) 20 MG tablet Take 20 mg by mouth every evening.    ? cholecalciferol (VITAMIN D) 25 MCG (1000 UNIT) tablet Take 1,000 Units by mouth in the morning.    ? ELIQUIS 5 MG TABS tablet TAKE 1 TABLET BY MOUTH  TWICE DAILY 180 tablet 1  ? finasteride (PROSCAR) 5 MG tablet Take 5 mg by mouth in the morning.    ? Multiple Vitamin (MULTIVITAMIN WITH MINERALS) TABS tablet Take 1 tablet by mouth in the morning.    ? olmesartan (BENICAR) 20 MG tablet Take 20 mg by mouth in the morning.    ? Omega-3 Fatty Acids (FISH  OIL) 1000 MG CAPS Take 1,000 mg by mouth 2 (two) times daily.    ? ?No current facility-administered medications for this visit.  ? ? ?Allergies:   Hydrochlorothiazide  ? ? ?Social History:  The patient  reports that he has never smoked. He has never used smokeless tobacco. He reports current alcohol use. He reports that he does not use drugs.  ? ?Family History:  The patient's family history includes COPD (age of onset: 38) in his father; Other (age of onset: 7) in his mother and sister.  ? ? ?ROS:  Please see the history of present illness.   Otherwise, review of systems are positive for .   All other systems are reviewed and negative.  ? ? ?PHYSICAL EXAM: ?VS:  BP (!) 140/92   Pulse 63   Ht 6' (1.829 m)   Wt 228 lb (103.4 kg)   SpO2 98%   BMI 30.92 kg/m?  , BMI Body mass index is 30.92 kg/m?. ?GEN: Well nourished, well developed, in no acute distress ?HEENT: normal ?Neck: no JVD, carotid bruits, or masses ?Cardiac: RRR; no murmurs, rubs, or gallops,no edema  ?Respiratory:  clear to auscultation bilaterally, normal work of breathing ?GI: soft, nontender, nondistended, + BS ?MS: no deformity or atrophy; well healed midline sternal scar; thicker scar at the lower portion ?Skin: warm and dry, no rash ?Neuro:  Strength and sensation are intact ?Psych: euthymic mood, full affect ? ? ?EKG:   ?The ekg ordered today demonstrates NSR, prolonged PR interval, LBBB ? ? ?Recent Labs: ?11/29/2020: ALT 23 ?12/02/2020: Magnesium 2.1 ?12/06/2020: BUN 18; Creatinine, Ser 1.13; Potassium 4.1; Sodium 138 ?03/21/2021: Hemoglobin 14.1; Platelets 204  ? ?Lipid Panel ?No results found for: CHOL, TRIG, HDL, CHOLHDL, VLDL, LDLCALC, LDLDIRECT ?  ?Other studies Reviewed: ?Additional studies/ records that were reviewed today with results demonstrating: Labs reviewed.  Echo from August 2022 reviewed showing small thoracic aortic aneurysm. ? ? ?ASSESSMENT AND PLAN: ? ?S/p AVR: Uses SBE prophylaxis.  Valve appears to be functioning well.  No  congestive heart failure.  Had some atypical chest pain.  This has resolved. ?HTN: The current medical regimen is effective;  continue present plan and medications.  137/75 at home.  Rare readings in the 140s.  Lowers with relaxation.   ?Hyperlipidemia: LDL 94 in 08/2020.  Continue atorvastatin.  ?Pacer:  Routine pacer checks with Dr. Graciela Husbands.  ?Thoracic aortic aneurysm: needs regular imaging.  Plan for CTA in 01/2022.  4.2 cm in 2022. ? ? ?Current medicines are reviewed at  length with the patient today.  The patient concerns regarding his medicines were addressed. ? ?The following changes have been made:  No change ? ?Labs/ tests ordered today include:  ?No orders of the defined types were placed in this encounter. ? ? ?Recommend 150 minutes/week of aerobic exercise ?Low fat, low carb, high fiber diet recommended ? ?Disposition:   FU in 6 months ? ? ?Signed, ?Larae Grooms, MD  ?10/17/2021 10:25 AM    ?[facility] ?[address], [place], [place]  [address] ?Phone: [phone]; Fax: [phone]  ? ?

## 2021-10-17 ENCOUNTER — Ambulatory Visit: Payer: Medicare Other | Admitting: Interventional Cardiology

## 2021-10-17 ENCOUNTER — Encounter: Payer: Self-pay | Admitting: Interventional Cardiology

## 2021-10-17 VITALS — BP 140/92 | HR 63 | Ht 72.0 in | Wt 228.0 lb

## 2021-10-17 DIAGNOSIS — Z952 Presence of prosthetic heart valve: Secondary | ICD-10-CM | POA: Diagnosis not present

## 2021-10-17 DIAGNOSIS — Z950 Presence of cardiac pacemaker: Secondary | ICD-10-CM

## 2021-10-17 DIAGNOSIS — E782 Mixed hyperlipidemia: Secondary | ICD-10-CM | POA: Diagnosis not present

## 2021-10-17 DIAGNOSIS — I712 Thoracic aortic aneurysm, without rupture, unspecified: Secondary | ICD-10-CM

## 2021-10-17 DIAGNOSIS — I10 Essential (primary) hypertension: Secondary | ICD-10-CM

## 2021-10-17 NOTE — Patient Instructions (Addendum)
Medication Instructions:  ?Your physician recommends that you continue on your current medications as directed. Please refer to the Current Medication list given to you today. ? ?*If you need a refill on your cardiac medications before your next appointment, please call your pharmacy* ? ? ?Lab Work: ?none ?If you have labs (blood work) drawn today and your tests are completely normal, you will receive your results only by: ?MyChart Message (if you have MyChart) OR ?A paper copy in the mail ?If you have any lab test that is abnormal or we need to change your treatment, we will call you to review the results. ? ? ?Testing/Procedures: ?Non-Cardiac CT Angiography (CTA), is a special type of CT scan that uses a computer to produce multi-dimensional views of major blood vessels throughout the body. In CT angiography, a contrast material is injected through an IV to help visualize the blood vessels  ?To be done in August 2023 ? ? ?Follow-Up: ?At [facility], you and your health needs are our priority.  As part of our continuing mission to provide you with exceptional heart care, we have created designated Provider Care Teams.  These Care Teams include your primary Cardiologist (physician) and Advanced Practice Providers (APPs -  Physician Assistants and Nurse Practitioners) who all work together to provide you with the care you need, when you need it. ? ?We recommend signing up for the patient portal called "MyChart".  Sign up information is provided on this After Visit Summary.  MyChart is used to connect with patients for Virtual Visits (Telemedicine).  Patients are able to view lab/test results, encounter notes, upcoming appointments, etc.  Non-urgent messages can be sent to your provider as well.   ?To learn more about what you can do with MyChart, go to [email or web address].   ? ?Your next appointment:   ?6 month(s) ? ?The format for your next appointment:   ?In Person ? ?Provider:   ?Lance Muss, MD    ? ? ?Other Instructions ?Please schedule follow up with Dr Graciela Husbands (in recalls) ? ?Important Information About Sugar ? ? ? ? ? ? ?

## 2021-10-20 ENCOUNTER — Other Ambulatory Visit: Payer: Self-pay | Admitting: Interventional Cardiology

## 2021-10-20 DIAGNOSIS — Q231 Congenital insufficiency of aortic valve: Secondary | ICD-10-CM

## 2021-10-20 NOTE — Telephone Encounter (Signed)
Prescription refill request for Eliquis received. ?Indication: Bicuspid Aortic Valve  ?Last office visit:10/17/21 [place])  ?Scr: 1.13 (12/06/20) ?Age: 81 ?Weight: 103.4kg ? ?Appropriate dose and refill sent to requested pharmacy.  ?

## 2021-11-29 ENCOUNTER — Ambulatory Visit: Payer: Medicare Other | Admitting: Interventional Cardiology

## 2021-12-01 ENCOUNTER — Encounter: Payer: Self-pay | Admitting: Interventional Cardiology

## 2021-12-06 ENCOUNTER — Ambulatory Visit (INDEPENDENT_AMBULATORY_CARE_PROVIDER_SITE_OTHER): Payer: Medicare Other

## 2021-12-06 DIAGNOSIS — I442 Atrioventricular block, complete: Secondary | ICD-10-CM

## 2021-12-08 LAB — CUP PACEART REMOTE DEVICE CHECK
Battery Remaining Longevity: 140 mo
Battery Voltage: 3.06 V
Brady Statistic AP VP Percent: 39.89 %
Brady Statistic AP VS Percent: 0.01 %
Brady Statistic AS VP Percent: 59.49 %
Brady Statistic AS VS Percent: 0.6 %
Brady Statistic RA Percent Paced: 40.29 %
Brady Statistic RV Percent Paced: 99.39 %
Date Time Interrogation Session: 20230620235802
Implantable Lead Implant Date: 20220622
Implantable Lead Implant Date: 20220622
Implantable Lead Location: 753859
Implantable Lead Location: 753860
Implantable Lead Model: 3830
Implantable Lead Model: 5076
Implantable Pulse Generator Implant Date: 20220622
Lead Channel Impedance Value: 228 Ohm
Lead Channel Impedance Value: 361 Ohm
Lead Channel Impedance Value: 437 Ohm
Lead Channel Impedance Value: 627 Ohm
Lead Channel Pacing Threshold Amplitude: 0.625 V
Lead Channel Pacing Threshold Amplitude: 1 V
Lead Channel Pacing Threshold Pulse Width: 0.4 ms
Lead Channel Pacing Threshold Pulse Width: 0.4 ms
Lead Channel Sensing Intrinsic Amplitude: 1.875 mV
Lead Channel Sensing Intrinsic Amplitude: 1.875 mV
Lead Channel Sensing Intrinsic Amplitude: 31.625 mV
Lead Channel Sensing Intrinsic Amplitude: 31.625 mV
Lead Channel Setting Pacing Amplitude: 2 V
Lead Channel Setting Pacing Amplitude: 2 V
Lead Channel Setting Pacing Pulse Width: 0.4 ms
Lead Channel Setting Sensing Sensitivity: 0.9 mV

## 2021-12-15 ENCOUNTER — Ambulatory Visit (HOSPITAL_BASED_OUTPATIENT_CLINIC_OR_DEPARTMENT_OTHER): Admission: RE | Admit: 2021-12-15 | Payer: Medicare Other | Source: Ambulatory Visit

## 2021-12-18 NOTE — Progress Notes (Signed)
Remote pacemaker transmission.   

## 2021-12-22 ENCOUNTER — Encounter: Payer: Medicare Other | Admitting: Internal Medicine

## 2022-01-10 ENCOUNTER — Encounter: Payer: Self-pay | Admitting: Internal Medicine

## 2022-01-10 ENCOUNTER — Ambulatory Visit (INDEPENDENT_AMBULATORY_CARE_PROVIDER_SITE_OTHER): Payer: Medicare Other | Admitting: Internal Medicine

## 2022-01-10 VITALS — BP 128/76 | HR 63 | Ht 72.0 in | Wt 224.2 lb

## 2022-01-10 DIAGNOSIS — Z950 Presence of cardiac pacemaker: Secondary | ICD-10-CM

## 2022-01-10 DIAGNOSIS — I442 Atrioventricular block, complete: Secondary | ICD-10-CM

## 2022-01-10 LAB — CUP PACEART INCLINIC DEVICE CHECK
Battery Remaining Longevity: 140 mo
Battery Voltage: 3.05 V
Brady Statistic AP VP Percent: 44.19 %
Brady Statistic AP VS Percent: 0.02 %
Brady Statistic AS VP Percent: 54.62 %
Brady Statistic AS VS Percent: 1.17 %
Brady Statistic RA Percent Paced: 44.72 %
Brady Statistic RV Percent Paced: 98.81 %
Date Time Interrogation Session: 20230726172239
Implantable Lead Implant Date: 20220622
Implantable Lead Implant Date: 20220622
Implantable Lead Location: 753859
Implantable Lead Location: 753860
Implantable Lead Model: 3830
Implantable Lead Model: 5076
Implantable Pulse Generator Implant Date: 20220622
Lead Channel Impedance Value: 247 Ohm
Lead Channel Impedance Value: 399 Ohm
Lead Channel Impedance Value: 475 Ohm
Lead Channel Impedance Value: 665 Ohm
Lead Channel Pacing Threshold Amplitude: 0.75 V
Lead Channel Pacing Threshold Amplitude: 1 V
Lead Channel Pacing Threshold Pulse Width: 0.4 ms
Lead Channel Pacing Threshold Pulse Width: 0.4 ms
Lead Channel Sensing Intrinsic Amplitude: 1.5 mV
Lead Channel Sensing Intrinsic Amplitude: 12 mV
Lead Channel Sensing Intrinsic Amplitude: 2.625 mV
Lead Channel Sensing Intrinsic Amplitude: 31.625 mV
Lead Channel Setting Pacing Amplitude: 2 V
Lead Channel Setting Pacing Amplitude: 2 V
Lead Channel Setting Pacing Pulse Width: 0.4 ms
Lead Channel Setting Sensing Sensitivity: 0.9 mV

## 2022-01-10 NOTE — Progress Notes (Unsigned)
Patient Care Team: [name], MD as PCP - General (Internal Medicine) [name], MD as PCP - Cardiology (Cardiology)   HPI  Derek Washington is a 81 y.o. 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 fib and flutter anticoagulated with apixaban   DATE TEST EF   5/22 LHC    % Non obstruc CAD X 50% RPAV  8/22 Echo   55-65 %         Date Cr K Hgb  6/22 1.13 4.1 9.6   7/23 1.08 4.2 13.1     Records and Results Reviewed   Past Medical History:  Diagnosis Date   Bicuspid aortic valve    LAST ECHOCARDIOGRAM APPROXIMATELY 2016? MILD AORTIC REGURG   BPH (benign prostatic hyperplasia)    Hard of hearing    Heart murmur    High cholesterol    Hydrocele    Hypertension    Insomnia    Low back pain    RIGHT   Mild obesity    Nocturia    Right elbow tendonitis    Tinnitus     Past Surgical History:  Procedure Laterality Date   AORTIC VALVE REPLACEMENT N/A 12/01/2020   Procedure: AORTIC VALVE REPLACEMENT (AVR) USING INSPIRIS AORTIC VALVE;  Surgeon: [name], MD;  [facility];  Service: Open Heart Surgery;  Laterality: N/A;   HYDROCELE EXCISION  2017   DR. [name] 2017   JOINT REPLACEMENT     left total knee replacement      2007    PACEMAKER IMPLANT N/A 12/07/2020   Procedure: PACEMAKER IMPLANT;  Surgeon: [name], MD;  Location: [facility] INVASIVE [facility];  Service: Cardiovascular;  Laterality: N/A;   right knee total replacement      2011   RIGHT/LEFT HEART CATH AND CORONARY ANGIOGRAPHY N/A 11/03/2020   Procedure: RIGHT/LEFT HEART CATH AND CORONARY ANGIOGRAPHY;  Surgeon: [name], MD;  [facility];  Service: Cardiovascular;  Laterality: N/A;   SCROTAL EXPLORATION N/A 12/26/2015   Procedure: SCROTUM  EXPLORATION;  Surgeon: [name], MD;  Location: [facility];  Service: Urology;  Laterality: N/A;  EXCISION OF LEFT EPIDIDYMAL CYST/SPERMATOCELE   TEE WITHOUT CARDIOVERSION N/A 12/01/2020   Procedure: TRANSESOPHAGEAL ECHOCARDIOGRAM (TEE);  Surgeon: [name], MD;  Location: [facility] OR;  Service: Open Heart Surgery;  Laterality: N/A;   TONSILLECTOMY     VASECTOMY     1968   Current Meds  Medication Sig   amLODipine (NORVASC) 10 MG tablet Take 10 mg by mouth every evening.   apixaban (ELIQUIS) 5 MG TABS tablet TAKE 1 TABLET BY MOUTH TWICE  DAILY   atorvastatin (LIPITOR) 20 MG tablet Take 20 mg by mouth every evening.   cholecalciferol (VITAMIN D) 25 MCG (1000 UNIT) tablet Take 1,000 Units by mouth in the morning.   finasteride (PROSCAR) 5 MG tablet Take 5 mg by mouth in the morning.   Multiple Vitamin (MULTIVITAMIN WITH MINERALS) TABS tablet Take 1 tablet by mouth in the morning.   olmesartan (BENICAR) 20 MG tablet Take 20 mg by mouth in the morning.   Omega-3 Fatty Acids (FISH OIL) 1000 MG CAPS Take 1,000 mg by mouth 2 (two) times daily.      Allergies  Allergen Reactions   Hydrochlorothiazide Other (See Comments)  fatigue      Review of Systems negative except from HPI and PMH  Physical Exam BP 128/76   Pulse 63   Ht 6' (1.829 m)   Wt 224 lb 3.2 oz (101.7 kg)   SpO2 95%   BMI 30.41 kg/m  Well developed and well nourished in no acute distress HENT normal Neck supple with JVP-flat Clear Device pocket well healed; without hematoma or erythema.  There is no tethering  Regular rate and rhythm, no *** gallop No ***/*** murmur Abd-soft with active BS No Clubbing cyanosis *** edema Skin-warm and dry A & Oriented  Grossly normal sensory and motor function  ECG P synchronous pacing at 63 Interval 24/17/45  #2 sinus with P synchronous pacing and infusion, with a PR interval of 216 and a QRS of 134  CrCl cannot be calculated (Patient's most recent lab result is older than  the maximum 21 days allowed.).   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  No programming changes will be made.  Intermittent nonsustained atrial fibrillation.  Continue Eliquis at 5 mg twice daily.  Anemia is resolved.  Blood pressure well controlled.  Continue on the amlodipine 10 and the Benicar

## 2022-01-10 NOTE — Patient Instructions (Signed)

## 2022-01-19 ENCOUNTER — Inpatient Hospital Stay: Admission: RE | Admit: 2022-01-19 | Payer: Medicare Other | Source: Ambulatory Visit

## 2022-01-22 ENCOUNTER — Ambulatory Visit (HOSPITAL_BASED_OUTPATIENT_CLINIC_OR_DEPARTMENT_OTHER)
Admission: RE | Admit: 2022-01-22 | Discharge: 2022-01-22 | Disposition: A | Discharge status: Home / Self Care | Payer: Medicare Other | Source: Ambulatory Visit | Attending: Interventional Cardiology | Admitting: Interventional Cardiology

## 2022-01-22 ENCOUNTER — Encounter (HOSPITAL_BASED_OUTPATIENT_CLINIC_OR_DEPARTMENT_OTHER): Payer: Self-pay

## 2022-01-22 DIAGNOSIS — I712 Thoracic aortic aneurysm, without rupture, unspecified: Secondary | ICD-10-CM | POA: Insufficient documentation

## 2022-01-22 MED ORDER — IOHEXOL 350 MG/ML SOLN
100.0000 mL | Freq: Once | INTRAVENOUS | Status: AC | PRN
  Administered 2022-01-22: 75 mL via INTRAVENOUS

## 2022-01-25 ENCOUNTER — Other Ambulatory Visit: Payer: Self-pay | Admitting: *Deleted

## 2022-01-25 DIAGNOSIS — I712 Thoracic aortic aneurysm, without rupture, unspecified: Secondary | ICD-10-CM

## 2022-01-30 ENCOUNTER — Emergency Department (HOSPITAL_COMMUNITY): Payer: Medicare Other

## 2022-01-30 ENCOUNTER — Inpatient Hospital Stay (HOSPITAL_COMMUNITY)
Admission: EM | Admit: 2022-01-30 | Discharge: 2022-02-06 | DRG: 660 | Disposition: A | Discharge status: Home / Self Care | Payer: Medicare Other | Attending: Family Medicine | Admitting: Family Medicine

## 2022-01-30 ENCOUNTER — Other Ambulatory Visit: Payer: Self-pay

## 2022-01-30 ENCOUNTER — Encounter (HOSPITAL_COMMUNITY): Payer: Self-pay | Admitting: Pharmacy Technician

## 2022-01-30 DIAGNOSIS — R34 Anuria and oliguria: Secondary | ICD-10-CM | POA: Diagnosis present

## 2022-01-30 DIAGNOSIS — N210 Calculus in bladder: Secondary | ICD-10-CM | POA: Diagnosis present

## 2022-01-30 DIAGNOSIS — E875 Hyperkalemia: Secondary | ICD-10-CM | POA: Diagnosis present

## 2022-01-30 DIAGNOSIS — Z952 Presence of prosthetic heart valve: Secondary | ICD-10-CM | POA: Diagnosis not present

## 2022-01-30 DIAGNOSIS — Z888 Allergy status to other drugs, medicaments and biological substances status: Secondary | ICD-10-CM | POA: Diagnosis not present

## 2022-01-30 DIAGNOSIS — E8721 Acute metabolic acidosis: Secondary | ICD-10-CM | POA: Diagnosis present

## 2022-01-30 DIAGNOSIS — N179 Acute kidney failure, unspecified: Principal | ICD-10-CM

## 2022-01-30 DIAGNOSIS — N132 Hydronephrosis with renal and ureteral calculous obstruction: Secondary | ICD-10-CM | POA: Diagnosis present

## 2022-01-30 DIAGNOSIS — T508X5A Adverse effect of diagnostic agents, initial encounter: Secondary | ICD-10-CM | POA: Diagnosis present

## 2022-01-30 DIAGNOSIS — N201 Calculus of ureter: Secondary | ICD-10-CM | POA: Diagnosis not present

## 2022-01-30 DIAGNOSIS — Z6831 Body mass index (BMI) 31.0-31.9, adult: Secondary | ICD-10-CM

## 2022-01-30 DIAGNOSIS — N211 Calculus in urethra: Secondary | ICD-10-CM | POA: Diagnosis present

## 2022-01-30 DIAGNOSIS — Z79899 Other long term (current) drug therapy: Secondary | ICD-10-CM | POA: Diagnosis not present

## 2022-01-30 DIAGNOSIS — E7800 Pure hypercholesterolemia, unspecified: Secondary | ICD-10-CM | POA: Diagnosis present

## 2022-01-30 DIAGNOSIS — K219 Gastro-esophageal reflux disease without esophagitis: Secondary | ICD-10-CM | POA: Diagnosis present

## 2022-01-30 DIAGNOSIS — I480 Paroxysmal atrial fibrillation: Secondary | ICD-10-CM | POA: Diagnosis not present

## 2022-01-30 DIAGNOSIS — N400 Enlarged prostate without lower urinary tract symptoms: Secondary | ICD-10-CM | POA: Diagnosis present

## 2022-01-30 DIAGNOSIS — E876 Hypokalemia: Secondary | ICD-10-CM | POA: Diagnosis not present

## 2022-01-30 DIAGNOSIS — Z96653 Presence of artificial knee joint, bilateral: Secondary | ICD-10-CM | POA: Diagnosis present

## 2022-01-30 DIAGNOSIS — Z950 Presence of cardiac pacemaker: Secondary | ICD-10-CM | POA: Diagnosis not present

## 2022-01-30 DIAGNOSIS — N170 Acute kidney failure with tubular necrosis: Principal | ICD-10-CM | POA: Diagnosis present

## 2022-01-30 DIAGNOSIS — N503 Cyst of epididymis: Secondary | ICD-10-CM | POA: Diagnosis present

## 2022-01-30 DIAGNOSIS — T502X5A Adverse effect of carbonic-anhydrase inhibitors, benzothiadiazides and other diuretics, initial encounter: Secondary | ICD-10-CM | POA: Diagnosis present

## 2022-01-30 DIAGNOSIS — I10 Essential (primary) hypertension: Secondary | ICD-10-CM | POA: Diagnosis present

## 2022-01-30 DIAGNOSIS — I2510 Atherosclerotic heart disease of native coronary artery without angina pectoris: Secondary | ICD-10-CM | POA: Diagnosis not present

## 2022-01-30 DIAGNOSIS — R310 Gross hematuria: Secondary | ICD-10-CM | POA: Diagnosis not present

## 2022-01-30 DIAGNOSIS — N1330 Unspecified hydronephrosis: Secondary | ICD-10-CM | POA: Diagnosis present

## 2022-01-30 DIAGNOSIS — E669 Obesity, unspecified: Secondary | ICD-10-CM | POA: Diagnosis present

## 2022-01-30 DIAGNOSIS — Z7901 Long term (current) use of anticoagulants: Secondary | ICD-10-CM | POA: Diagnosis not present

## 2022-01-30 DIAGNOSIS — Z66 Do not resuscitate: Secondary | ICD-10-CM | POA: Diagnosis present

## 2022-01-30 DIAGNOSIS — Z87442 Personal history of urinary calculi: Secondary | ICD-10-CM

## 2022-01-30 DIAGNOSIS — R251 Tremor, unspecified: Secondary | ICD-10-CM | POA: Diagnosis present

## 2022-01-30 HISTORY — DX: Presence of cardiac pacemaker: Z95.0

## 2022-01-30 LAB — URINALYSIS, ROUTINE W REFLEX MICROSCOPIC
Bilirubin Urine: NEGATIVE
Glucose, UA: NEGATIVE mg/dL
Hgb urine dipstick: NEGATIVE
Ketones, ur: NEGATIVE mg/dL
Leukocytes,Ua: NEGATIVE
Nitrite: NEGATIVE
Protein, ur: NEGATIVE mg/dL
Specific Gravity, Urine: 1.009 (ref 1.005–1.030)
pH: 6 (ref 5.0–8.0)

## 2022-01-30 LAB — CBC WITH DIFFERENTIAL/PLATELET
Abs Immature Granulocytes: 0.03 10*3/uL (ref 0.00–0.07)
Basophils Absolute: 0 10*3/uL (ref 0.0–0.1)
Basophils Relative: 0 %
Eosinophils Absolute: 0.1 10*3/uL (ref 0.0–0.5)
Eosinophils Relative: 2 %
HCT: 36.2 % — ABNORMAL LOW (ref 39.0–52.0)
Hemoglobin: 12.5 g/dL — ABNORMAL LOW (ref 13.0–17.0)
Immature Granulocytes: 0 %
Lymphocytes Relative: 15 %
Lymphs Abs: 1.1 10*3/uL (ref 0.7–4.0)
MCH: 33.2 pg (ref 26.0–34.0)
MCHC: 34.5 g/dL (ref 30.0–36.0)
MCV: 96 fL (ref 80.0–100.0)
Monocytes Absolute: 0.5 10*3/uL (ref 0.1–1.0)
Monocytes Relative: 6 %
Neutro Abs: 5.7 10*3/uL (ref 1.7–7.7)
Neutrophils Relative %: 77 %
Platelets: 132 10*3/uL — ABNORMAL LOW (ref 150–400)
RBC: 3.77 MIL/uL — ABNORMAL LOW (ref 4.22–5.81)
RDW: 14 % (ref 11.5–15.5)
WBC: 7.4 10*3/uL (ref 4.0–10.5)
nRBC: 0 % (ref 0.0–0.2)

## 2022-01-30 LAB — BASIC METABOLIC PANEL
Anion gap: 13 (ref 5–15)
BUN: 121 mg/dL — ABNORMAL HIGH (ref 8–23)
CO2: 17 mmol/L — ABNORMAL LOW (ref 22–32)
Calcium: 9.6 mg/dL (ref 8.9–10.3)
Chloride: 109 mmol/L (ref 98–111)
Creatinine, Ser: 11.47 mg/dL — ABNORMAL HIGH (ref 0.61–1.24)
GFR, Estimated: 4 mL/min — ABNORMAL LOW (ref >60)
Glucose, Bld: 84 mg/dL (ref 70–99)
Potassium: 6.4 mmol/L (ref 3.5–5.1)
Sodium: 139 mmol/L (ref 135–145)

## 2022-01-30 LAB — SODIUM, URINE, RANDOM: Sodium, Ur: 96 mmol/L

## 2022-01-30 LAB — COMPREHENSIVE METABOLIC PANEL
ALT: 24 U/L (ref 0–44)
AST: 14 U/L — ABNORMAL LOW (ref 15–41)
Albumin: 3.8 g/dL (ref 3.5–5.0)
Alkaline Phosphatase: 46 U/L (ref 38–126)
Anion gap: 15 (ref 5–15)
BUN: 120 mg/dL — ABNORMAL HIGH (ref 8–23)
CO2: 17 mmol/L — ABNORMAL LOW (ref 22–32)
Calcium: 9.7 mg/dL (ref 8.9–10.3)
Chloride: 108 mmol/L (ref 98–111)
Creatinine, Ser: 11.39 mg/dL — ABNORMAL HIGH (ref 0.61–1.24)
GFR, Estimated: 4 mL/min — ABNORMAL LOW (ref >60)
Glucose, Bld: 98 mg/dL (ref 70–99)
Potassium: 6.1 mmol/L — ABNORMAL HIGH (ref 3.5–5.1)
Sodium: 140 mmol/L (ref 135–145)
Total Bilirubin: 0.9 mg/dL (ref 0.3–1.2)
Total Protein: 6.5 g/dL (ref 6.5–8.1)

## 2022-01-30 LAB — PROTEIN / CREATININE RATIO, URINE
Creatinine, Urine: 58 mg/dL
Protein Creatinine Ratio: 0.24 mg/mg{Cre} — ABNORMAL HIGH (ref 0.00–0.15)
Total Protein, Urine: 14 mg/dL

## 2022-01-30 LAB — BRAIN NATRIURETIC PEPTIDE: B Natriuretic Peptide: 478.3 pg/mL — ABNORMAL HIGH (ref 0.0–100.0)

## 2022-01-30 LAB — CREATININE, URINE, RANDOM: Creatinine, Urine: 60 mg/dL

## 2022-01-30 MED ORDER — ATORVASTATIN CALCIUM 20 MG PO TABS
20.0000 mg | ORAL_TABLET | Freq: Every evening | ORAL | Status: DC
  Administered 2022-01-31 – 2022-02-05 (×6): 20 mg via ORAL
  Filled 2022-01-30 (×3): qty 2
  Filled 2022-01-30: qty 1
  Filled 2022-01-30 (×2): qty 2

## 2022-01-30 MED ORDER — HYDRALAZINE HCL 20 MG/ML IJ SOLN: 10.0000 mg | Freq: Four times a day (QID) | INTRAMUSCULAR | Status: DC | PRN

## 2022-01-30 MED ORDER — ONDANSETRON HCL 4 MG/2ML IJ SOLN
4.0000 mg | Freq: Four times a day (QID) | INTRAMUSCULAR | Status: DC | PRN
  Administered 2022-01-30: 4 mg via INTRAVENOUS
  Filled 2022-01-30: qty 2

## 2022-01-30 MED ORDER — VITAMIN D 25 MCG (1000 UNIT) PO TABS
1000.0000 [IU] | ORAL_TABLET | Freq: Every morning | ORAL | Status: DC
  Administered 2022-01-31 – 2022-02-06 (×7): 1000 [IU] via ORAL
  Filled 2022-01-30 (×7): qty 1

## 2022-01-30 MED ORDER — SODIUM CHLORIDE 0.9 % IV SOLN: INTRAVENOUS | Status: AC

## 2022-01-30 MED ORDER — SODIUM ZIRCONIUM CYCLOSILICATE 10 G PO PACK
10.0000 g | PACK | Freq: Two times a day (BID) | ORAL | Status: DC
  Administered 2022-01-30 – 2022-01-31 (×3): 10 g via ORAL
  Filled 2022-01-30 (×3): qty 1

## 2022-01-30 MED ORDER — HEPARIN SODIUM (PORCINE) 5000 UNIT/ML IJ SOLN: 5000.0000 [IU] | Freq: Three times a day (TID) | INTRAMUSCULAR | Status: DC

## 2022-01-30 MED ORDER — ACETAMINOPHEN 325 MG PO TABS
650.0000 mg | ORAL_TABLET | Freq: Four times a day (QID) | ORAL | Status: DC | PRN
  Administered 2022-02-04 – 2022-02-05 (×3): 650 mg via ORAL
  Filled 2022-01-30 (×3): qty 2

## 2022-01-30 MED ORDER — ONDANSETRON HCL 4 MG PO TABS: 4.0000 mg | ORAL_TABLET | Freq: Four times a day (QID) | ORAL | Status: DC | PRN

## 2022-01-30 MED ORDER — ACETAMINOPHEN 650 MG RE SUPP: 650.0000 mg | Freq: Four times a day (QID) | RECTAL | Status: DC | PRN

## 2022-01-30 MED ORDER — APIXABAN 2.5 MG PO TABS
2.5000 mg | ORAL_TABLET | Freq: Two times a day (BID) | ORAL | Status: DC
  Administered 2022-01-30 – 2022-02-03 (×9): 2.5 mg via ORAL
  Filled 2022-01-30 (×10): qty 1

## 2022-01-30 MED ORDER — CALCIUM GLUCONATE-NACL 1-0.675 GM/50ML-% IV SOLN
1.0000 g | Freq: Once | INTRAVENOUS | Status: AC
  Administered 2022-01-30: 1000 mg via INTRAVENOUS
  Filled 2022-01-30: qty 50

## 2022-01-30 MED ORDER — AMLODIPINE BESYLATE 10 MG PO TABS
10.0000 mg | ORAL_TABLET | Freq: Every evening | ORAL | Status: DC
  Administered 2022-01-31 – 2022-02-05 (×6): 10 mg via ORAL
  Filled 2022-01-30: qty 1
  Filled 2022-01-30: qty 2
  Filled 2022-01-30 (×4): qty 1

## 2022-01-30 MED ORDER — FINASTERIDE 5 MG PO TABS
5.0000 mg | ORAL_TABLET | Freq: Every morning | ORAL | Status: DC
  Administered 2022-01-31 – 2022-02-06 (×7): 5 mg via ORAL
  Filled 2022-01-30 (×7): qty 1

## 2022-01-30 MED ORDER — POLYETHYLENE GLYCOL 3350 17 G PO PACK
17.0000 g | PACK | Freq: Every day | ORAL | Status: DC | PRN
  Administered 2022-02-06: 17 g via ORAL
  Filled 2022-01-30: qty 1

## 2022-01-30 NOTE — Progress Notes (Signed)
ANTICOAGULATION CONSULT NOTE - Initial Consult  Pharmacy Consult for apixaban dosing Indication: atrial fibrillation   Allergies  Allergen Reactions   Hydrochlorothiazide Other (See Comments)    fatigue    Vital Signs: Temp: 97.6 F (36.4 C) (08/15 1442) Temp Source: Oral (08/15 1220) BP: 163/78 (08/15 1700) Pulse Rate: 74 (08/15 1700)  Labs: Recent Labs    01/30/22 1241  HGB 12.5*  HCT 36.2*  PLT 132*  CREATININE 11.39*    CrCl cannot be calculated (Unknown ideal weight.).   Medical History: Past Medical History:  Diagnosis Date   Bicuspid aortic valve    LAST ECHOCARDIOGRAM APPROXIMATELY 2016? MILD AORTIC REGURG   BPH (benign prostatic hyperplasia)    Hard of hearing    Heart murmur    High cholesterol    Hydrocele    Hypertension    Insomnia    Low back pain    RIGHT   Mild obesity    Nocturia    Right elbow tendonitis    Tinnitus     Medications:  (Not in a hospital admission)   Assessment: 89 YOM patient admitted with a cc of fatigue. He is s/p bioprosthetic aortic valve replacement in June 2022.   A cardiology note from May 2023 states the following "Eliquis was started due to high burden of AFib. in 04/2021"- Dr. Catalina Gravel MD  His current creatinine is 11.39 mg/dL. A consult was received to dose apixaban for AVR in setting of AF/Aflutter.  Goal of Therapy:  Monitor platelets by anticoagulation protocol: Yes   Plan:  Start apixaban 2.5 mg po bid (reduced dose 2/2 age of 33 years and current creatinine of 11.39 mg/dL Pharmacy will sign off of consult but continue to follow patient making recommendations prn. Thank you for allowing Pharmacy to participate in the care of this patient.   Greta Doom BS, PharmD, BCPS Clinical Pharmacist 01/30/2022 5:43 PM  Contact: [phone] after 3 PM  "Be curious, not judgmental..." -Debbora Dus

## 2022-01-30 NOTE — ED Notes (Signed)
RN gave pt urinal

## 2022-01-30 NOTE — ED Notes (Signed)
Bladder scan 45mL

## 2022-01-30 NOTE — Consult Note (Signed)
[facility]  HISTORY AND PHYSICAL  Derek Washington is an 81 y.o. male.    Chief Complaint: fatigue  HPI: Pt is an 45M with a PMH sig for bicuspid aortic valve s/p AVR, s/p PPM,  HTN, HLD, BPH who presented to [facility] ED with abnormal labs and fatigue.    Had a regular visit with PCP in July- BP found to be high.  Was on olmesartan and amlodipine and HCTZ added.  Tolerated this addition fine initially.   Last week had a CTA on 01/22/22 to follow up on AVR and aortic dilatation.  This was with contrast.    Has had 1 week of fatigue and weakness.  No CP, SOB, rashes, joint pains, blood in urine/ stool or other abnormal symptoms.  Went to PCP- had significant AKI and so was directed here.    Here, Cr 11.61, K 6.1.  Still making urine although not going as much.  No hiccups, food aversions, lethargy.  Has a tremor which pt and wife say is not worsening and not new.  Took 2 doses of advil this past week but does not regularly consume it.  PMH: Past Medical History:  Diagnosis Date   Bicuspid aortic valve    LAST ECHOCARDIOGRAM APPROXIMATELY 2016? MILD AORTIC REGURG   BPH (benign prostatic hyperplasia)    Hard of hearing    Heart murmur    High cholesterol    Hydrocele    Hypertension    Insomnia    Low back pain    RIGHT   Mild obesity    Nocturia    Right elbow tendonitis    Tinnitus    PSH: Past Surgical History:  Procedure Laterality Date   AORTIC VALVE REPLACEMENT N/A 12/01/2020   Procedure: AORTIC VALVE REPLACEMENT (AVR) USING INSPIRIS AORTIC VALVE;  Surgeon: [name], MD;  [facility]: [facility];  Service: Open Heart Surgery;  Laterality: N/A;   HYDROCELE EXCISION  2017   DR. [name] 2017   JOINT REPLACEMENT     left total knee replacement      2007    PACEMAKER IMPLANT N/A 12/07/2020   Procedure: PACEMAKER IMPLANT;  Surgeon: [name], MD;  Location: [facility] INVASIVE CV LAB;  Service: Cardiovascular;  Laterality: N/A;   right knee total replacement       2011   RIGHT/LEFT HEART CATH AND CORONARY ANGIOGRAPHY N/A 11/03/2020   Procedure: RIGHT/LEFT HEART CATH AND CORONARY ANGIOGRAPHY;  Surgeon: [name], MD;  [facility]: [facility];  Service: Cardiovascular;  Laterality: N/A;   SCROTAL EXPLORATION N/A 12/26/2015   Procedure: SCROTUM EXPLORATION;  Surgeon: [name], MD;  Location: [facility];  Service: Urology;  Laterality: N/A;  EXCISION OF LEFT EPIDIDYMAL CYST/SPERMATOCELE   TEE WITHOUT CARDIOVERSION N/A 12/01/2020   Procedure: TRANSESOPHAGEAL ECHOCARDIOGRAM (TEE);  Surgeon: [name], MD;  Location: [facility] OR;  Service: Open Heart Surgery;  Laterality: N/A;   TONSILLECTOMY     VASECTOMY     1968    Past Medical History:  Diagnosis Date   Bicuspid aortic valve    LAST ECHOCARDIOGRAM APPROXIMATELY 2016? MILD AORTIC REGURG   BPH (benign prostatic hyperplasia)    Hard of hearing    Heart murmur    High cholesterol    Hydrocele    Hypertension    Insomnia    Low back pain    RIGHT   Mild obesity    Nocturia    Right elbow tendonitis  Tinnitus     Medications:  Scheduled:  [START ON 01/31/2022] amLODipine  10 mg Oral QPM   [START ON 01/31/2022] atorvastatin  20 mg Oral QPM   [START ON 01/31/2022] cholecalciferol  1,000 Units Oral q AM   [START ON 01/31/2022] finasteride  5 mg Oral q AM   sodium zirconium cyclosilicate  10 g Oral BID    (Not in a hospital admission)   ALLERGIES:   Allergies  Allergen Reactions   Hydrochlorothiazide Other (See Comments)    fatigue    FAM HX: Family History  Problem Relation Age of Onset   Other Mother 61       HEAVY SMOKER   COPD Father 3       HEAVY SMOKER   Other Sister 28       HEAVY SMOKER    Social History:   reports that he has never smoked. He has never used smokeless tobacco. He reports current alcohol use. He reports that he does not use drugs.  ROS: ROS: all other systems reviewed and are negative except as per HPI  Blood pressure (!) 165/87,  pulse 73, temperature 97.6 F (36.4 C), resp. rate 14, SpO2 98 %. PHYSICAL EXAM: Physical Exam GEN NAD, lying flat in bed HEENT EOMI PERRL NECK no JVD PULM clear CV RRR crisp S2 ABD soft, nondistended EXT trace LE edema NEURO AAO x 3 nonfocal SKIN no rashes MSK no effusions   Results for orders placed or performed during the hospital encounter of 01/30/22 (from the past 48 hour(s))  CBC with Differential     Status: Abnormal   Collection Time: 01/30/22 12:41 PM  Result Value Ref Range   WBC 7.4 4.0 - 10.5 K/uL   RBC 3.77 (L) 4.22 - 5.81 MIL/uL   Hemoglobin 12.5 (L) 13.0 - 17.0 g/dL   HCT 26.9 (L) 48.5 - 46.2 %   MCV 96.0 80.0 - 100.0 fL   MCH 33.2 26.0 - 34.0 pg   MCHC 34.5 30.0 - 36.0 g/dL   RDW 70.3 50.0 - 93.8 %   Platelets 132 (L) 150 - 400 K/uL   nRBC 0.0 0.0 - 0.2 %   Neutrophils Relative % 77 %   Neutro Abs 5.7 1.7 - 7.7 K/uL   Lymphocytes Relative 15 %   Lymphs Abs 1.1 0.7 - 4.0 K/uL   Monocytes Relative 6 %   Monocytes Absolute 0.5 0.1 - 1.0 K/uL   Eosinophils Relative 2 %   Eosinophils Absolute 0.1 0.0 - 0.5 K/uL   Basophils Relative 0 %   Basophils Absolute 0.0 0.0 - 0.1 K/uL   Immature Granulocytes 0 %   Abs Immature Granulocytes 0.03 0.00 - 0.07 K/uL    Comment: Performed at [facility], [address] [place], [place] [address]  Comprehensive metabolic panel     Status: Abnormal   Collection Time: 01/30/22 12:41 PM  Result Value Ref Range   Sodium 140 135 - 145 mmol/L   Potassium 6.1 (H) 3.5 - 5.1 mmol/L   Chloride 108 98 - 111 mmol/L   CO2 17 (L) 22 - 32 mmol/L   Glucose, Bld 98 70 - 99 mg/dL    Comment: Glucose reference range applies only to samples taken after fasting for at least 8 hours.   BUN 120 (H) 8 - 23 mg/dL   Creatinine, Ser 37.16 (H) 0.61 - 1.24 mg/dL   Calcium 9.7 8.9 - 96.7 mg/dL   Total Protein 6.5 6.5 - 8.1  g/dL   Albumin 3.8 3.5 - 5.0 g/dL   AST 14 (L) 15 - 41 U/L   ALT 24 0 - 44 U/L   Alkaline Phosphatase 46 38 - 126  U/L   Total Bilirubin 0.9 0.3 - 1.2 mg/dL   GFR, Estimated 4 (L) >60 mL/min    Comment: (NOTE) Calculated using the CKD-EPI Creatinine Equation (2021)    Anion gap 15 5 - 15    Comment: Performed at [facility], [address] [place], [place] [address]  Brain natriuretic peptide     Status: Abnormal   Collection Time: 01/30/22 12:41 PM  Result Value Ref Range   B Natriuretic Peptide 478.3 (H) 0.0 - 100.0 pg/mL    Comment: Performed at [facility], [address], [place], [place] [address]    DG Chest 2 View  Result Date: 01/30/2022 CLINICAL DATA:  Shortness of breath. EXAM: CHEST - 2 VIEW COMPARISON:  Chest radiograph 01/18/2021 and chest CT 01/22/2022 FINDINGS: Stable appearance of the left chest dual chamber cardiac pacemaker. Patient has had a median sternotomy and aortic valve replacement. Heart size is stable. Atherosclerotic calcifications at the aortic arch. Both lungs are clear without airspace disease or pulmonary edema. No large pleural effusions. Bridging osteophytes in the thoracic spine. IMPRESSION: No active cardiopulmonary disease. Electronically Signed   By: Richarda Overlie M.D.   On: 01/30/2022 12:51    Assessment/Plan  AKI: bsaeline cr was 1.13 as of 11/2020.  Now 11 in the setting of recent addition of HCTZ (July 17th per records) and then CTA with contrast.    - no real symptoms for GN  - suspect 2 hits with the HCTZ and then the CTA  - send UA and UP/C  - will do basic serologic workup   - hold ARB and HCTZ  - gentle IVFs  - renal US- bladder scan in ED 45 mL so don't suspect retaining   - no indication for HD just yet but discussed with pt and wife that this may be a possiblity as a temporary measure  2.  Hyperkalemia  - lokelma BID  3.  HTN:  - amlodipine ok   4.  Dispo: being admitted  Bufford Buttner 01/30/2022, 4:32 PM

## 2022-01-30 NOTE — H&P (Signed)
History and Physical    Patient: Derek Washington DOB: 1940/09/07 DOA: 01/30/2022 DOS: the patient was seen and examined on 01/30/2022 PCP: [name], MD  Patient coming from: Home  Chief Complaint:  Chief Complaint  Patient presents with   Abnormal Lab   HPI: Derek Washington is a 81 y.o. 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  Of note patient was recently started on additional medication for his blood pressure after which he had increasing fatigue weakness.  Lab was done which showed renal failure so patient was referred to our hospital.  Patient reports decreasing urinary output and 10 pound weight gain over the last 1 week.  He denies orthopnea or PND but has some congestion recently.  Denies any hematuria urgency frequency or dysuria.  Denies any fever, chills or rigor.  Denies any nausea, vomiting, abdominal pain or diarrhea.  No blood in stool.  Patient occasionally takes Aleve for pain but not regularly.  Has mild dizziness at baseline but no headache syncope.  Denies any chest pain, palpitations.    In the ED, patient received [facility] x1, and was started on IV fluids.  Had mildly elevated blood pressure at 165/87.  He was not tachycardic.  Lab data showed BUN of 120 with creatinine of 111.3 and anion gap of 15.  Potassium was 6.1.  WBC at 7.4 with hemoglobin of 12.5.  BNP at 478.   In the ED, patient received. Nephrology Dr. Signe Colt was consulted from the ED and patient was considered for admission to the hospital for further work-up and treatment.    Review of Systems: As mentioned in the history of present illness. All other systems reviewed and are negative.  Past Medical History:  Diagnosis Date   Bicuspid aortic valve    LAST ECHOCARDIOGRAM APPROXIMATELY 2016? MILD AORTIC REGURG   BPH (benign prostatic  hyperplasia)    Hard of hearing    Heart murmur    High cholesterol    Hydrocele    Hypertension    Insomnia    Low back pain    RIGHT   Mild obesity    Nocturia    Right elbow tendonitis    Tinnitus    Past Surgical History:  Procedure Laterality Date   AORTIC VALVE REPLACEMENT N/A 12/01/2020   Procedure: AORTIC VALVE REPLACEMENT (AVR) USING INSPIRIS AORTIC VALVE;  Surgeon: [name], MD;  Location: [facility] OR;  Service: Open Heart Surgery;  Laterality: N/A;   HYDROCELE EXCISION  2017   DR. [name] 2017   JOINT REPLACEMENT     left total knee replacement      2007    PACEMAKER IMPLANT N/A 12/07/2020   Procedure: PACEMAKER IMPLANT;  Surgeon: [name], MD;  Location: [facility] INVASIVE CV LAB;  Service: Cardiovascular;  Laterality: N/A;   right knee total replacement      2011   RIGHT/LEFT HEART CATH AND CORONARY ANGIOGRAPHY N/A 11/03/2020   Procedure: RIGHT/LEFT HEART CATH AND CORONARY ANGIOGRAPHY;  Surgeon: [name], MD;  Location: [facility] INVASIVE CV LAB;  Service: Cardiovascular;  Laterality: N/A;   SCROTAL EXPLORATION N/A 12/26/2015   Procedure: SCROTUM EXPLORATION;  Surgeon: [name], MD;  Location: [facility] ORS;  Service: Urology;  Laterality: N/A;  EXCISION OF LEFT EPIDIDYMAL CYST/SPERMATOCELE   TEE WITHOUT CARDIOVERSION N/A 12/01/2020   Procedure: TRANSESOPHAGEAL ECHOCARDIOGRAM (TEE);  Surgeon: Laneta Simmers,  Payton Doughty, MD;  Location: [facility] OR;  Service: Open Heart Surgery;  Laterality: N/A;   TONSILLECTOMY     VASECTOMY     1968   Social History:  reports that he has never smoked. He has never used smokeless tobacco. He reports current alcohol use. He reports that he does not use drugs.  Allergies  Allergen Reactions   Hydrochlorothiazide Other (See Comments)    fatigue    Family History  Problem Relation Age of Onset   Other Mother 91       HEAVY SMOKER   COPD Father 64       HEAVY SMOKER   Other Sister 61       HEAVY SMOKER    Prior to Admission  medications   Medication Sig Start Date End Date Taking? Authorizing Provider  amLODipine (NORVASC) 10 MG tablet Take 10 mg by mouth every evening.   Yes [provider]  apixaban (ELIQUIS) 5 MG TABS tablet TAKE 1 TABLET BY MOUTH TWICE  DAILY 10/20/21  Yes [name], MD  atorvastatin (LIPITOR) 20 MG tablet Take 20 mg by mouth every evening. 11/15/15  Yes [provider]  cholecalciferol (VITAMIN D) 25 MCG (1000 UNIT) tablet Take 1,000 Units by mouth in the morning.   Yes [provider]  finasteride (PROSCAR) 5 MG tablet Take 5 mg by mouth in the morning. 11/15/15  Yes [provider]  Multiple Vitamin (MULTIVITAMIN WITH MINERALS) TABS tablet Take 1 tablet by mouth in the morning.   Yes [provider]  olmesartan (BENICAR) 20 MG tablet Take 20 mg by mouth in the morning.   Yes [provider]  Omega-3 Fatty Acids (FISH OIL) 1000 MG CAPS Take 1,000 mg by mouth 2 (two) times daily.   Yes [provider]  furosemide (LASIX) 20 MG tablet Take 20 mg by mouth daily. 01/30/22   [provider]    Physical Exam: Vitals:   01/30/22 1220 01/30/22 1442 01/30/22 1515 01/30/22 1545  BP: (!) 179/88 (!) 172/89 (!) 156/129 (!) 165/87  Pulse: 71 61 74 73  Resp: 20 16 11 14   Temp: 98.2 F (36.8 C) 97.6 F (36.4 C)    TempSrc: Oral     SpO2: 97% 96% 99% 98%   General:  Average built, not in obvious distress, elderly male, Communicative HENT:   No scleral pallor or icterus noted. Oral mucosa is moist.  Chest:  Clear breath sounds.  Sternal scar noted.  Diminished breath sounds bilaterally. No crackles or wheezes.  CVS: S1 &S2 heard. No murmur.  Regular rate and rhythm. Abdomen: Soft, nontender, nondistended.  Bowel sounds are heard.   Extremities: No cyanosis, clubbing lateral lower extremity edema peripheral pulses are palpable. Psych: Alert, awake and oriented, normal mood CNS:  No cranial nerve deficits.  Power equal in all  extremities.  Mild tremors noted. Skin: Warm and dry.  No rashes noted.  Data Reviewed:    Latest Ref Rng & Units 01/30/2022   12:41 PM 03/21/2021    4:31 PM 12/04/2020    1:28 AM  CBC  WBC 4.0 - 10.5 K/uL 7.4  9.2  9.7   Hemoglobin 13.0 - 17.0 g/dL 12/06/2020  09.6  9.6   Hematocrit 39.0 - 52.0 % 36.2  42.5  28.8   Platelets 150 - 400 K/uL 132  204  103        Latest Ref Rng & Units 01/30/2022   12:41 PM 12/06/2020  1:19 AM 12/04/2020    1:28 AM  BMP  Glucose 70 - 99 mg/dL 98  92  448   BUN 8 - 23 mg/dL 185  18  19   Creatinine 0.61 - 1.24 mg/dL 63.14  9.70  2.63   Sodium 135 - 145 mmol/L 140  138  135   Potassium 3.5 - 5.1 mmol/L 6.1  4.1  4.8   Chloride 98 - 111 mmol/L 108  104  103   CO2 22 - 32 mmol/L 17  26  26    Calcium 8.9 - 10.3 mg/dL 9.7  8.8  8.7      Assessment and Plan: Principal Problem:   AKI (acute kidney injury) (HCC) Active Problems:   Gastroesophageal reflux disease   High cholesterol   S/P AVR (aortic valve replacement)   BPH (benign prostatic hyperplasia)   Hypertension   Hyperkalemia   Acute kidney injury.  Patient oliguric with the weight gain.  Creatinine on presentation at 11.3.  No obvious signs of uremia except for fatigue.  Significant elevation of BUN and creatinine on presentation.  Patient was on ARB as outpatient and was recently put on HCTZ.  We will hold both.  Hold Lasix as well.  Nephrology was notified from the ED.  We will get urinary lites, calculate FeNa, check urinalysis.  Check renal ultrasound to rule out hydronephrosis.  Bladder scan did not show much urine.  Patient does have BPH but has never had a urinary retention in the past.  We will continue to monitor intake and output charting, daily weights.  No hypertension or hematuria recent nausea vomiting or diarrhea.  Creatinine 1 year back was 1.2.  Hyperkalemia.  Hold ARB.  Received Lokelma in the ED.  Check BMP in the evening.  Telemetry monitor.  Hypertension.  Patient was on  amlodipine, Benicar Lasix and likely HCTZ as outpatient.  We will hold all of them.   As needed hydralazine for now.  Continue amlodipine.  History of aortic valve replacement.  On Eliquis.  Will need to renally dose it.  Consult pharmacy.  History of BPH.  On finasteride.  We will continue.  No history of urinary retention Foley catheter placement in the past.  Will check renal ultrasound.  Consider Foley catheter if renal function does not improve/abnormal ultrasound.  Hyperlipidemia.  Continue Lipitor.  DVT prophylaxis.  On Eliquis at home will renally dose.    Advance Care Planning: CODE STATUS.  I spoke with the patient as well as patient's wife at bedside.  Patient is DO NOT RESUSCITATE.  Consults: Nephrology Dr. was notified from the ED  Family Communication: Spoke with the patient's wife at bedside.  Severity of Illness: The appropriate patient status for this patient is INPATIENT. Inpatient status is judged to be reasonable and necessary in order to provide the required intensity of service to ensure the patient's safety. The patient's presenting symptoms, physical exam findings, and initial radiographic and laboratory data in the context of their chronic comorbidities is felt to place them at high risk for further clinical deterioration. Furthermore, it is not anticipated that the patient will be medically stable for discharge from the hospital within 2 midnights of admission.  I certify that at the point of admission it is my clinical judgment that the patient will require inpatient hospital care spanning beyond 2 midnights from the point of admission due to high intensity of service, high risk for further deterioration and high frequency of surveillance required.  Author: Joycelyn Das, MD 01/30/2022 4:11 PM  For on call review [email or web address].

## 2022-01-30 NOTE — ED Provider Triage Note (Signed)
Emergency Medicine Provider Triage Evaluation Note  Derek Washington , a 81 y.o. male  was evaluated in triage.  Pt complains of feeling bad.  Patient has been feeling short of breath and congested, started on HCTZ 3 weeks ago.  Seen by primary, told he was in kidney failure.  He also had lower extremity edema which is new.  He is not having any chest pain, denies nausea, vomiting, abdominal pain, hematuria, dysuria, headache, vision changes..  Review of Systems  Per HPI  Physical Exam  BP (!) 179/88 (BP Location: Right Arm)   Pulse 71   Temp 98.2 F (36.8 C) (Oral)   Resp 20   SpO2 97%  Gen:   Awake, no distress   Resp:  Normal effort  MSK:   Moves extremities without difficulty  Other:  Pitting edema to lower extremities up to knees.  Loud murmur, lungs clear to auscultation.  Medical Decision Making  Medically screening exam initiated at 12:33 PM.  Appropriate orders placed.  [name] was informed that the remainder of the evaluation will be completed by another provider, this initial triage assessment does not replace that evaluation, and the importance of remaining in the ED until their evaluation is complete.  Unable to see labs from [facility] from today.  Kidney function was normal 3 weeks ago per chart review.   Theron Arista, PA-C 01/30/22 1234

## 2022-01-30 NOTE — ED Notes (Signed)
Patient complaints of nausea and requesting meds

## 2022-01-30 NOTE — ED Provider Notes (Signed)
[facility] EMERGENCY DEPARTMENT Provider Note   CSN: [id number] Arrival date & time: 01/30/22  1148     History  Chief Complaint  Patient presents with   Abnormal Lab    Derek Washington is a 81 y.o. male.  HPI 81 year old male presents with abnormal kidney function.  About 3 weeks ago he was put on a blood pressure medicine but he cannot remember the name of.  Since then his blood pressure has been much better, previously was around 160 and at home when he checks it is 120-130 systolic.  However over the last week or so he has been tired and fatigued.  No chest pain, shortness of breath.  Wife has noticed bilateral leg swelling and his doctor was concerned about lower leg swelling bilaterally.  Patient had labs done today which were significantly different from 3 weeks ago and showed renal failure.  Patient states that a week or so ago he had a night where he had trouble urinating a couple times but since then he has been urinating.  He is urinating less but feels like he is emptying his bladder.  Chart review after initial arrival shows that he has been on Norvasc and olmesartan chronically for high blood pressure.  HCTZ was added a few weeks ago.  Now complaining of some eye redness and perhaps periorbital swelling and so his PCP took him off the HCTZ today.  Patient tells me that his leg swelling in his ankles has been there for quite some time.  He does not appreciate otherwise lower leg swelling.  Home Medications Prior to Admission medications   Medication Sig Start Date End Date Taking? Authorizing Provider  amLODipine (NORVASC) 10 MG tablet Take 10 mg by mouth every evening.   Yes [provider]  apixaban (ELIQUIS) 5 MG TABS tablet TAKE 1 TABLET BY MOUTH TWICE  DAILY 10/20/21  Yes [name], MD  atorvastatin (LIPITOR) 20 MG tablet Take 20 mg by mouth every evening. 11/15/15  Yes [provider]  cholecalciferol (VITAMIN D) 25 MCG  (1000 UNIT) tablet Take 1,000 Units by mouth in the morning.   Yes [provider]  finasteride (PROSCAR) 5 MG tablet Take 5 mg by mouth in the morning. 11/15/15  Yes [provider]  Multiple Vitamin (MULTIVITAMIN WITH MINERALS) TABS tablet Take 1 tablet by mouth in the morning.   Yes [provider]  olmesartan (BENICAR) 20 MG tablet Take 20 mg by mouth in the morning.   Yes [provider]  Omega-3 Fatty Acids (FISH OIL) 1000 MG CAPS Take 1,000 mg by mouth 2 (two) times daily.   Yes [provider]  furosemide (LASIX) 20 MG tablet Take 20 mg by mouth daily. 01/30/22   [provider]      Allergies    Hydrochlorothiazide    Review of Systems   Review of Systems  Constitutional:  Positive for fatigue.  Respiratory:  Negative for shortness of breath.   Cardiovascular:  Positive for leg swelling. Negative for chest pain.  Gastrointestinal:  Negative for abdominal pain, diarrhea and vomiting.  Genitourinary:  Positive for decreased urine volume. Negative for dysuria and flank pain.  Musculoskeletal:  Negative for back pain.    Physical Exam Updated Vital Signs BP (!) 156/129   Pulse 74   Temp 97.6 F (36.4 C)   Resp 11   SpO2 99%  Physical Exam Vitals and nursing note reviewed.  Constitutional:  Appearance: He is well-developed.  HENT:     Head: Normocephalic and atraumatic.     Comments: No significant facial swelling or periorbital edema noted Cardiovascular:     Rate and Rhythm: Normal rate and regular rhythm.     Comments: Aortic valve click Pulmonary:     Effort: Pulmonary effort is normal.     Breath sounds: Normal breath sounds. No wheezing, rhonchi or rales.  Abdominal:     Palpations: Abdomen is soft.     Tenderness: There is no abdominal tenderness. There is no right CVA tenderness or left CVA tenderness.  Musculoskeletal:     Comments: Patient has indentions from his socks just above his ankles.  Otherwise  I do not appreciate any pitting edema to the feet, ankles, or lower legs bilaterally  Skin:    General: Skin is warm and dry.  Neurological:     Mental Status: He is alert.     ED Results / Procedures / Treatments   Labs (all labs ordered are listed, but only abnormal results are displayed) Labs Reviewed  CBC WITH DIFFERENTIAL/PLATELET - Abnormal; Notable for the following components:      Result Value   RBC 3.77 (*)    Hemoglobin 12.5 (*)    HCT 36.2 (*)    Platelets 132 (*)    All other components within normal limits  COMPREHENSIVE METABOLIC PANEL - Abnormal; Notable for the following components:   Potassium 6.1 (*)    CO2 17 (*)    BUN 120 (*)    Creatinine, Ser 11.39 (*)    AST 14 (*)    GFR, Estimated 4 (*)    All other components within normal limits  BRAIN NATRIURETIC PEPTIDE - Abnormal; Notable for the following components:   B Natriuretic Peptide 478.3 (*)    All other components within normal limits  URINALYSIS, ROUTINE W REFLEX MICROSCOPIC  SODIUM, URINE, RANDOM  CREATININE, URINE, RANDOM    EKG EKG Interpretation  Date/Time:  Tuesday January 30 2022 12:24:02 EDT Ventricular Rate:  64 PR Interval:  250 QRS Duration: 168 QT Interval:  452 QTC Calculation: 466 R Axis:   18 Text Interpretation: Atrial-sensed ventricular-paced rhythm with prolonged AV conduction Abnormal ECG When compared with ECG of 08-Dec-2020 03:49, PREVIOUS ECG IS PRESENT Confirmed by Margarita Grizzle [id number]) on 01/30/2022 1:28:44 PM  Radiology DG Chest 2 View  Result Date: 01/30/2022 CLINICAL DATA:  Shortness of breath. EXAM: CHEST - 2 VIEW COMPARISON:  Chest radiograph 01/18/2021 and chest CT 01/22/2022 FINDINGS: Stable appearance of the left chest dual chamber cardiac pacemaker. Patient has had a median sternotomy and aortic valve replacement. Heart size is stable. Atherosclerotic calcifications at the aortic arch. Both lungs are clear without airspace disease or pulmonary edema. No large  pleural effusions. Bridging osteophytes in the thoracic spine. IMPRESSION: No active cardiopulmonary disease. Electronically Signed   By: Richarda Overlie M.D.   On: 01/30/2022 12:51    Procedures Ultrasound ED Renal  Date/Time: 01/30/2022 3:25 PM  Performed by: Pricilla Loveless, MD Authorized by: Pricilla Loveless, MD   Procedure details:    Indications: acute renal injury     Technique:  Bladder Bladder findings:    Bladder:  Visualized   Free pelvic fluid: not identified     Volume:  50 mL     Medications Ordered in ED Medications  sodium zirconium cyclosilicate (LOKELMA) packet 10 g (10 g Oral Given 01/30/22 1547)  0.9 %  sodium chloride infusion ( Intravenous New  Bag/Given 01/30/22 1549)    ED Course/ Medical Decision Making/ A&P                           Medical Decision Making Amount and/or Complexity of Data Reviewed External Data Reviewed: labs and notes.    Details: PCP notes/labs sent via fax Labs:     Details: Cr 11 new compared to our most recent as well as a few weeks ago from PCP K 6.1 Radiology: independent interpretation performed.    Details: No CHF ECG/medicine tests: independent interpretation performed.    Details: no signs of hyperkalemia   Patient is well-appearing.  Mildly hypertensive here.  I discussed with Dr. Signe Colt, for now advises gentle fluids such as 75 cc/h over 10 hours.  Otherwise she will order some Lokelma for his potassium of 6.1.  No obvious EKG changes.  Patient does not have any obvious urinary retention to cause his symptoms.  Does not appear to be in CHF on exam.  Discussed with hospitalist for admission.        Final Clinical Impression(s) / ED Diagnoses Final diagnoses:  Acute kidney injury [facility])    Rx / DC Orders ED Discharge Orders     None         Pricilla Loveless, MD 01/30/22 1558

## 2022-01-30 NOTE — ED Triage Notes (Addendum)
Pt here after PCP called and said that his kidney function is off. Pt gained 10lbs in the last week. Pt recently started on new BP medication.

## 2022-01-31 DIAGNOSIS — E7800 Pure hypercholesterolemia, unspecified: Secondary | ICD-10-CM | POA: Diagnosis not present

## 2022-01-31 DIAGNOSIS — N179 Acute kidney failure, unspecified: Secondary | ICD-10-CM | POA: Diagnosis not present

## 2022-01-31 DIAGNOSIS — E8721 Acute metabolic acidosis: Secondary | ICD-10-CM | POA: Diagnosis not present

## 2022-01-31 DIAGNOSIS — N400 Enlarged prostate without lower urinary tract symptoms: Secondary | ICD-10-CM | POA: Diagnosis not present

## 2022-01-31 LAB — PHOSPHORUS: Phosphorus: 7.8 mg/dL — ABNORMAL HIGH (ref 2.5–4.6)

## 2022-01-31 LAB — CBC
HCT: 35.6 % — ABNORMAL LOW (ref 39.0–52.0)
Hemoglobin: 12 g/dL — ABNORMAL LOW (ref 13.0–17.0)
MCH: 32.8 pg (ref 26.0–34.0)
MCHC: 33.7 g/dL (ref 30.0–36.0)
MCV: 97.3 fL (ref 80.0–100.0)
Platelets: 135 10*3/uL — ABNORMAL LOW (ref 150–400)
RBC: 3.66 MIL/uL — ABNORMAL LOW (ref 4.22–5.81)
RDW: 14 % (ref 11.5–15.5)
WBC: 7.6 10*3/uL (ref 4.0–10.5)
nRBC: 0 % (ref 0.0–0.2)

## 2022-01-31 LAB — COMPREHENSIVE METABOLIC PANEL
ALT: 24 U/L (ref 0–44)
AST: 13 U/L — ABNORMAL LOW (ref 15–41)
Albumin: 3.6 g/dL (ref 3.5–5.0)
Alkaline Phosphatase: 43 U/L (ref 38–126)
Anion gap: 14 (ref 5–15)
BUN: 117 mg/dL — ABNORMAL HIGH (ref 8–23)
CO2: 16 mmol/L — ABNORMAL LOW (ref 22–32)
Calcium: 9.4 mg/dL (ref 8.9–10.3)
Chloride: 108 mmol/L (ref 98–111)
Creatinine, Ser: 11.44 mg/dL — ABNORMAL HIGH (ref 0.61–1.24)
GFR, Estimated: 4 mL/min — ABNORMAL LOW (ref >60)
Glucose, Bld: 95 mg/dL (ref 70–99)
Potassium: 5.7 mmol/L — ABNORMAL HIGH (ref 3.5–5.1)
Sodium: 138 mmol/L (ref 135–145)
Total Bilirubin: 1.3 mg/dL — ABNORMAL HIGH (ref 0.3–1.2)
Total Protein: 6.2 g/dL — ABNORMAL LOW (ref 6.5–8.1)

## 2022-01-31 LAB — RENAL FUNCTION PANEL
Albumin: 3.4 g/dL — ABNORMAL LOW (ref 3.5–5.0)
Anion gap: 14 (ref 5–15)
BUN: 116 mg/dL — ABNORMAL HIGH (ref 8–23)
CO2: 16 mmol/L — ABNORMAL LOW (ref 22–32)
Calcium: 9.1 mg/dL (ref 8.9–10.3)
Chloride: 109 mmol/L (ref 98–111)
Creatinine, Ser: 11.51 mg/dL — ABNORMAL HIGH (ref 0.61–1.24)
GFR, Estimated: 4 mL/min — ABNORMAL LOW (ref >60)
Glucose, Bld: 89 mg/dL (ref 70–99)
Phosphorus: 7.6 mg/dL — ABNORMAL HIGH (ref 2.5–4.6)
Potassium: 6.1 mmol/L — ABNORMAL HIGH (ref 3.5–5.1)
Sodium: 139 mmol/L (ref 135–145)

## 2022-01-31 LAB — MAGNESIUM: Magnesium: 2.2 mg/dL (ref 1.7–2.4)

## 2022-01-31 LAB — HEMOGLOBIN A1C
Hgb A1c MFr Bld: 5.2 % (ref 4.8–5.6)
Mean Plasma Glucose: 102.54 mg/dL

## 2022-01-31 MED ORDER — SODIUM CHLORIDE 0.9 % IV SOLN: INTRAVENOUS | Status: AC

## 2022-01-31 MED ORDER — ALBUTEROL SULFATE (2.5 MG/3ML) 0.083% IN NEBU
2.5000 mg | INHALATION_SOLUTION | Freq: Once | RESPIRATORY_TRACT | Status: AC
  Administered 2022-01-31: 2.5 mg via RESPIRATORY_TRACT
  Filled 2022-01-31: qty 3

## 2022-01-31 MED ORDER — SODIUM BICARBONATE 650 MG PO TABS
1300.0000 mg | ORAL_TABLET | Freq: Two times a day (BID) | ORAL | Status: DC
Start: 2022-01-31 — End: 2022-02-04
  Administered 2022-01-31 – 2022-02-03 (×8): 1300 mg via ORAL
  Filled 2022-01-31 (×8): qty 2

## 2022-01-31 MED ORDER — SODIUM ZIRCONIUM CYCLOSILICATE 10 G PO PACK
10.0000 g | PACK | Freq: Three times a day (TID) | ORAL | Status: DC
Start: 2022-01-31 — End: 2022-02-02
  Administered 2022-01-31 – 2022-02-01 (×5): 10 g via ORAL
  Filled 2022-01-31 (×8): qty 1

## 2022-01-31 NOTE — Progress Notes (Signed)
[facility] Progress Note   Assessment/ Plan:    AKI: bsaeline cr was 1.13 as of 11/2020.  Now 11 in the setting of recent addition of HCTZ (July 17th per records) and then CTA with contrast.               - no real symptoms for GN             - suspect 2 hits with the HCTZ and then the CTA             - send UA and UP/C--> bland with minimal proteinuria             - will do basic serologic workup              - hold ARB and HCTZ             - gentle IVFs             - renal US- bladder scan in ED 45 mL so don't suspect retaining, no hydronephrosis             - no indication for HD just yet but discussed with pt and wife that this may be a possiblity as a temporary measure  - aggressive medical management of hyperK and acidosis   2.  Hyperkalemia- high 5s low 6s             - increase lokelma to TID  - albuterol neb now  3.  Metabolic acidosis:  - bicarbonate 1300 BID   4.  HTN:             - amlodipine ok    5.  Dispo: being admitted  Subjective:    Feeling better, had more UOP today and Bun slightly coming down,  Cr still the same, K still up   Objective:   BP (!) 151/93   Pulse 67   Temp 97.8 F (36.6 C) (Oral)   Resp 18   SpO2 97%   Intake/Output Summary (Last 24 hours) at 01/31/2022 1524 Last data filed at 01/31/2022 1345 Gross per 24 hour  Intake 49 ml  Output 600 ml  Net -551 ml   Weight change:   Physical Exam: GEN NAD, lying flat in bed HEENT EOMI PERRL NECK no JVD PULM clear CV RRR crisp S2 ABD soft, nondistended EXT trace LE edema NEURO AAO x 3 nonfocal SKIN no rashes MSK no effusions  Imaging: US RENAL  Result Date: 01/30/2022 CLINICAL DATA:  Acute kidney injury. EXAM: RENAL / URINARY TRACT ULTRASOUND COMPLETE COMPARISON:  CT abdomen and pelvis 11/09/2020 FINDINGS: Right Kidney: Renal measurements: 11.1 x 5.0 x 5.8 cm = volume: 170 mL. Increased echogenicity. Cortical thinning. No hydronephrosis. Two cysts are identified. One in  the superior pole measures 2.6 x 2.7 x 2.7 cm. One in the inferior pole measures 3.5 x 3.2 x 3.7 cm Left Kidney: Renal measurements: 13.0 x 7.4 x 6.8 cm = volume: 338 mL. Increased echogenicity. No hydronephrosis. Two cysts are identified in the left kidney. Superior pole cyst measures 8.3 x 6.5 x 4.9 cm. Inferior pole cyst measures 4.0 x 2.9 x 3.1 cm. Bladder: 1.4 cm echogenic focus is seen in the dependent portion of the bladder with shadowing. Bladder is otherwise within normal limits. Other: Prostate gland appears enlarged measuring 5.2 x 4.5 x 3.8 cm IMPRESSION: 1. No hydronephrosis. 2. Echogenic kidneys bilaterally with right renal cortical thinning compatible with medical renal disease. 3.  Bilateral renal cysts. 4. 1.4 cm calculus in the bladder. 5. Prostatomegaly. Electronically Signed   By: Darliss Cheney M.D.   On: 01/30/2022 16:59   DG Chest 2 View  Result Date: 01/30/2022 CLINICAL DATA:  Shortness of breath. EXAM: CHEST - 2 VIEW COMPARISON:  Chest radiograph 01/18/2021 and chest CT 01/22/2022 FINDINGS: Stable appearance of the left chest dual chamber cardiac pacemaker. Patient has had a median sternotomy and aortic valve replacement. Heart size is stable. Atherosclerotic calcifications at the aortic arch. Both lungs are clear without airspace disease or pulmonary edema. No large pleural effusions. Bridging osteophytes in the thoracic spine. IMPRESSION: No active cardiopulmonary disease. Electronically Signed   By: Richarda Overlie M.D.   On: 01/30/2022 12:51    Labs: BMET Recent Labs  Lab 01/30/22 1241 01/30/22 1751 01/31/22 0130 01/31/22 1248  NA 140 139 138 139  K 6.1* 6.4* 5.7* 6.1*  CL 108 109 108 109  CO2 17* 17* 16* 16*  GLUCOSE 98 84 95 89  BUN 120* 121* 117* 116*  CREATININE 11.39* 11.47* 11.44* 11.51*  CALCIUM 9.7 9.6 9.4 9.1  PHOS  --   --  7.8* 7.6*   CBC Recent Labs  Lab 01/30/22 1241 01/31/22 0130  WBC 7.4 7.6  NEUTROABS 5.7  --   HGB 12.5* 12.0*  HCT 36.2* 35.6*   MCV 96.0 97.3  PLT 132* 135*    Medications:     albuterol  2.5 mg Nebulization Once   amLODipine  10 mg Oral QPM   apixaban  2.5 mg Oral BID   atorvastatin  20 mg Oral QPM   cholecalciferol  1,000 Units Oral q AM   finasteride  5 mg Oral q AM   sodium bicarbonate  1,300 mg Oral BID   sodium zirconium cyclosilicate  10 g Oral TID    Bufford Buttner, MD 01/31/2022, 3:24 PM

## 2022-01-31 NOTE — Progress Notes (Signed)
[facility] night cross cover note:   I was notified that BMP drawn at 1751 on 01/30/22 shows potassium level of 6.4.  Per my chart review, this is an 81 year old male admitted earlier in the day to the hospitalist service for acute renal failure, with CMP drawn around noon showing creatinine of 11.39 and potassium of 6.1.  Nephrology was consulted and patient was started on gentle IV fluids as well as twice daily dosing of Lokelma for the hyperkalemia.  No indication for urgent hemodialysis per this consult.    First dose of Lokelma was administered at 1600, with ensuing BMP, drawn at 1751, which demonstrated the interval increase in potassium level to 6.4.  associated creatinine appears stable at 11.47, and there continues to be no evidence of anion gap metabolic acidosis.   As it appears that the patient's renal function may be plateauing per this most recent bmp, and given that less than 2 hours elapsed from the time of first dose of Lokelma until collection of BMP, will proceed with next dose of Lokelma, as scheduled, followed by repeat BMP at 1 AM on 01/31/2022 to further evaluate interval trend in serum potassium level/renal function. Most recent calcium level noted to be 9.6. At this time, will also order calcium gluconate 1 g IV over 1 hour x 1 dose for stabilization of myocardium, and will repeat EKG. continue to monitor on telemetry. VSS appear stable, including no evidence of bradycardia. I have also added a serum magnesium level to trend this electrolyte in response to plan for interval IV calcium gluconate, as above.  Update: 0100 AM CMP notable for interval improvement in serum potassium level, now down to 5.7, along with slight interval improvement in renal function.  will continue plan for acute renal failure and hyperkalemia as outlined by nephrology.      Newton Pigg, DO Hospitalist

## 2022-01-31 NOTE — Progress Notes (Signed)
PROGRESS NOTE    Derek Washington  NIO:[id number] DOB: 1940/09/10 DOA: 01/30/2022 PCP: [name], MD    Chief Complaint  Patient presents with   Abnormal Lab    Brief Narrative:  Patient is a pleasant 81 year old gentleman history of bicuspid aortic valve status post AVR, status post PPM, hypertension, hyperlipidemia, BPH presented to the ED with abnormal labs and fatigue noted to be in acute renal failure.  Patient noted to have been seen by PCP in July and noted to be on ARB and Norvasc with HCTZ added for better blood pressure control.  Patient noted to have also undergone CT angiogram on 01/22/2022 for follow-up on AVR and aortic valve dilatation with contrast noted.  Patient presented with a week of fatigue, generalized weakness.  Noted to have significant acute kidney injury and sent to the ED.  Patient noted on admission to have a creatinine of 11.61, potassium of 6.1.  Patient admitted for further evaluation and management.  Nephrology consulted.   Assessment & Plan:   Principal Problem:   AKI (acute kidney injury) (HCC) Active Problems:   Gastroesophageal reflux disease   High cholesterol   S/P AVR (aortic valve replacement)   BPH (benign prostatic hyperplasia)   Hypertension   Hyperkalemia   Acute metabolic acidosis  #1 acute kidney injury (baseline creatinine approximately 1.13 11/2020) -Patient noted on admission with creatinine of 11.39.  Potassium of 6.4. -Likely secondary to prerenal azotemia in the setting of HCTZ and recent contrast from CT angiogram. -Patient states making urine, urine output not recorded. -Renal ultrasound negative for hydronephrosis, echogenic kidneys bilaterally with right renal cortical thinning compatible with medical renal disease, bilateral renal cysts, 1.4 cm calculus in the bladder, prostatomegaly. -Urinalysis nitrite negative, leukocytes negative, negative for ketones, negative for protein, negative for glucose. -Urine sodium of 96, urine  creatinine of 60. -Patient placed on gentle IV fluid hydration by nephrology. -Continue to hold ARB, HCTZ. -Per nephrology.  2.  Hyperkalemia -Likely secondary to problem #1. -Repeat potassium this morning noted at 5.7, repeat at 6.1. -Lokelma increased to 3 times daily per nephrology.  Follow.  3.  Metabolic acidosis -Likely secondary to problem #1. -Patient started on bicarb tablets per nephrology. -For blood.  4.  Hypertension -Continue Norvasc. -Continue to hold ARB, HCTZ. -If further blood pressure control needed may consider addition of hydralazine.  5.  History of BPH -Continue finasteride. -Renal ultrasound negative for hydronephrosis. -Follow-up.  6.  Hyperlipidemia -Stat and.  7.  History of aortic valve replacement -Continue Eliquis.  DVT prophylaxis: Eliquis Code Status: DNR Family Communication: Updated patient and wife at bedside. Disposition: Home when clinically improved, resolution of acute renal failure, when cleared by nephrology.  Status is: Inpatient Remains inpatient appropriate because: Severity of illness   Consultants:  Nephrology: Dr. Signe Colt 01/30/2022  Procedures:  Chest x-ray 01/30/2022 Renal ultrasound 01/30/2022   Antimicrobials:  None   Subjective: Sitting up in gurney.  Denies any chest pain.  No shortness of breath.  No abdominal pain.  Wife at bedside.  Does endorse good urine output.  Objective: Vitals:   01/31/22 1045 01/31/22 1235 01/31/22 1355 01/31/22 1632  BP: 139/83 (!) 151/93  (!) 158/105  Pulse: 60 67  70  Resp: 14 18  17   Temp:   97.8 F (36.6 C) 97.7 F (36.5 C)  TempSrc:   Oral Oral  SpO2: 95% 97%  96%    Intake/Output Summary (Last 24 hours) at 01/31/2022 1640 Last data filed at 01/31/2022 1633  Gross per 24 hour  Intake 49 ml  Output 700 ml  Net -651 ml   There were no vitals filed for this visit.  Examination:  General exam: Appears calm and comfortable  Respiratory system: Clear to auscultation  bilaterally, no wheezes, no crackles, no rhonchi. Respiratory effort normal. Cardiovascular system: S1 & S2 heard, RRR. No JVD, murmurs, rubs, gallops or clicks. No pedal edema. Gastrointestinal system: Abdomen is nondistended, soft and nontender. No organomegaly or masses felt. Normal bowel sounds heard. Central nervous system: Alert and oriented. No focal neurological deficits. Extremities: Symmetric 5 x 5 power. Skin: No rashes, lesions or ulcers Psychiatry: Judgement and insight appear normal. Mood & affect appropriate.     Data Reviewed: I have personally reviewed following labs and imaging studies  CBC: Recent Labs  Lab 01/30/22 1241 01/31/22 0130  WBC 7.4 7.6  NEUTROABS 5.7  --   HGB 12.5* 12.0*  HCT 36.2* 35.6*  MCV 96.0 97.3  PLT 132* 135*    Basic Metabolic Panel: Recent Labs  Lab 01/30/22 1241 01/30/22 1751 01/31/22 0130 01/31/22 1248  NA 140 139 138 139  K 6.1* 6.4* 5.7* 6.1*  CL 108 109 108 109  CO2 17* 17* 16* 16*  GLUCOSE 98 84 95 89  BUN 120* 121* 117* 116*  CREATININE 11.39* 11.47* 11.44* 11.51*  CALCIUM 9.7 9.6 9.4 9.1  MG  --   --  2.2  --   PHOS  --   --  7.8* 7.6*    GFR: CrCl cannot be calculated (Unknown ideal weight.).  Liver Function Tests: Recent Labs  Lab 01/30/22 1241 01/31/22 0130 01/31/22 1248  AST 14* 13*  --   ALT 24 24  --   ALKPHOS 46 43  --   BILITOT 0.9 1.3*  --   PROT 6.5 6.2*  --   ALBUMIN 3.8 3.6 3.4*    CBG: No results for input(s): "GLUCAP" in the last 168 hours.   No results found for this or any previous visit (from the past 240 hour(s)).       Radiology Studies: US RENAL  Result Date: 01/30/2022 CLINICAL DATA:  Acute kidney injury. EXAM: RENAL / URINARY TRACT ULTRASOUND COMPLETE COMPARISON:  CT abdomen and pelvis 11/09/2020 FINDINGS: Right Kidney: Renal measurements: 11.1 x 5.0 x 5.8 cm = volume: 170 mL. Increased echogenicity. Cortical thinning. No hydronephrosis. Two cysts are identified. One in  the superior pole measures 2.6 x 2.7 x 2.7 cm. One in the inferior pole measures 3.5 x 3.2 x 3.7 cm Left Kidney: Renal measurements: 13.0 x 7.4 x 6.8 cm = volume: 338 mL. Increased echogenicity. No hydronephrosis. Two cysts are identified in the left kidney. Superior pole cyst measures 8.3 x 6.5 x 4.9 cm. Inferior pole cyst measures 4.0 x 2.9 x 3.1 cm. Bladder: 1.4 cm echogenic focus is seen in the dependent portion of the bladder with shadowing. Bladder is otherwise within normal limits. Other: Prostate gland appears enlarged measuring 5.2 x 4.5 x 3.8 cm IMPRESSION: 1. No hydronephrosis. 2. Echogenic kidneys bilaterally with right renal cortical thinning compatible with medical renal disease. 3. Bilateral renal cysts. 4. 1.4 cm calculus in the bladder. 5. Prostatomegaly. Electronically Signed   By: Ronney Asters M.D.   On: 01/30/2022 16:59   DG Chest 2 View  Result Date: 01/30/2022 CLINICAL DATA:  Shortness of breath. EXAM: CHEST - 2 VIEW COMPARISON:  Chest radiograph 01/18/2021 and chest CT 01/22/2022 FINDINGS: Stable appearance of the left chest dual chamber  cardiac pacemaker. Patient has had a median sternotomy and aortic valve replacement. Heart size is stable. Atherosclerotic calcifications at the aortic arch. Both lungs are clear without airspace disease or pulmonary edema. No large pleural effusions. Bridging osteophytes in the thoracic spine. IMPRESSION: No active cardiopulmonary disease. Electronically Signed   By: Richarda Overlie M.D.   On: 01/30/2022 12:51        Scheduled Meds:  amLODipine  10 mg Oral QPM   apixaban  2.5 mg Oral BID   atorvastatin  20 mg Oral QPM   cholecalciferol  1,000 Units Oral q AM   finasteride  5 mg Oral q AM   sodium bicarbonate  1,300 mg Oral BID   sodium zirconium cyclosilicate  10 g Oral TID   Continuous Infusions:  sodium chloride 75 mL/hr at 01/31/22 0858     LOS: 1 day    Time spent: 40 minutes    Ramiro Harvest, MD [facility]   To  contact the attending provider between 7A-7P or the covering provider during after hours 7P-7A, please log into the web site [email or web address] and access using universal [place] password for that web site. If you do not have the password, please call the hospital operator.  01/31/2022, 4:40 PM

## 2022-02-01 ENCOUNTER — Inpatient Hospital Stay (HOSPITAL_COMMUNITY): Payer: Medicare Other

## 2022-02-01 DIAGNOSIS — E7800 Pure hypercholesterolemia, unspecified: Secondary | ICD-10-CM | POA: Diagnosis not present

## 2022-02-01 DIAGNOSIS — N400 Enlarged prostate without lower urinary tract symptoms: Secondary | ICD-10-CM | POA: Diagnosis not present

## 2022-02-01 DIAGNOSIS — E8721 Acute metabolic acidosis: Secondary | ICD-10-CM | POA: Diagnosis not present

## 2022-02-01 DIAGNOSIS — N179 Acute kidney failure, unspecified: Secondary | ICD-10-CM | POA: Diagnosis not present

## 2022-02-01 HISTORY — PX: IR FLUORO GUIDE CV LINE RIGHT: IMG2283

## 2022-02-01 HISTORY — PX: IR US GUIDE VASC ACCESS RIGHT: IMG2390

## 2022-02-01 LAB — RENAL FUNCTION PANEL
Albumin: 2.9 g/dL — ABNORMAL LOW (ref 3.5–5.0)
Albumin: 3.4 g/dL — ABNORMAL LOW (ref 3.5–5.0)
Anion gap: 13 (ref 5–15)
Anion gap: 14 (ref 5–15)
BUN: 118 mg/dL — ABNORMAL HIGH (ref 8–23)
BUN: 117 mg/dL — ABNORMAL HIGH (ref 8–23)
CO2: 17 mmol/L — ABNORMAL LOW (ref 22–32)
CO2: 18 mmol/L — ABNORMAL LOW (ref 22–32)
Calcium: 8.7 mg/dL — ABNORMAL LOW (ref 8.9–10.3)
Calcium: 9 mg/dL (ref 8.9–10.3)
Chloride: 108 mmol/L (ref 98–111)
Chloride: 106 mmol/L (ref 98–111)
Creatinine, Ser: 12.2 mg/dL — ABNORMAL HIGH (ref 0.61–1.24)
Creatinine, Ser: 12.67 mg/dL — ABNORMAL HIGH (ref 0.61–1.24)
GFR, Estimated: 4 mL/min — ABNORMAL LOW (ref >60)
GFR, Estimated: 4 mL/min — ABNORMAL LOW (ref >60)
Glucose, Bld: 107 mg/dL — ABNORMAL HIGH (ref 70–99)
Glucose, Bld: 98 mg/dL (ref 70–99)
Phosphorus: 8.6 mg/dL — ABNORMAL HIGH (ref 2.5–4.6)
Phosphorus: 8.4 mg/dL — ABNORMAL HIGH (ref 2.5–4.6)
Potassium: 5.3 mmol/L — ABNORMAL HIGH (ref 3.5–5.1)
Potassium: 5.1 mmol/L (ref 3.5–5.1)
Sodium: 138 mmol/L (ref 135–145)
Sodium: 138 mmol/L (ref 135–145)

## 2022-02-01 LAB — HEPATITIS B CORE ANTIBODY, TOTAL: Hep B Core Total Ab: NONREACTIVE

## 2022-02-01 LAB — CBC
HCT: 30.2 % — ABNORMAL LOW (ref 39.0–52.0)
HCT: 33.1 % — ABNORMAL LOW (ref 39.0–52.0)
Hemoglobin: 10.6 g/dL — ABNORMAL LOW (ref 13.0–17.0)
Hemoglobin: 11.7 g/dL — ABNORMAL LOW (ref 13.0–17.0)
MCH: 33.1 pg (ref 26.0–34.0)
MCH: 33.1 pg (ref 26.0–34.0)
MCHC: 35.1 g/dL (ref 30.0–36.0)
MCHC: 35.3 g/dL (ref 30.0–36.0)
MCV: 94.4 fL (ref 80.0–100.0)
MCV: 93.8 fL (ref 80.0–100.0)
Platelets: 122 10*3/uL — ABNORMAL LOW (ref 150–400)
Platelets: 123 10*3/uL — ABNORMAL LOW (ref 150–400)
RBC: 3.2 MIL/uL — ABNORMAL LOW (ref 4.22–5.81)
RBC: 3.53 MIL/uL — ABNORMAL LOW (ref 4.22–5.81)
RDW: 13.9 % (ref 11.5–15.5)
RDW: 13.9 % (ref 11.5–15.5)
WBC: 5.2 10*3/uL (ref 4.0–10.5)
WBC: 5.6 10*3/uL (ref 4.0–10.5)
nRBC: 0 % (ref 0.0–0.2)
nRBC: 0 % (ref 0.0–0.2)

## 2022-02-01 LAB — C3 COMPLEMENT: C3 Complement: 119 mg/dL (ref 82–167)

## 2022-02-01 LAB — ANA W/REFLEX IF POSITIVE: Anti Nuclear Antibody (ANA): NEGATIVE

## 2022-02-01 LAB — C4 COMPLEMENT: Complement C4, Body Fluid: 43 mg/dL — ABNORMAL HIGH (ref 12–38)

## 2022-02-01 LAB — HEPATITIS B SURFACE ANTIGEN: Hepatitis B Surface Ag: NONREACTIVE

## 2022-02-01 LAB — MAGNESIUM: Magnesium: 2.3 mg/dL (ref 1.7–2.4)

## 2022-02-01 LAB — HEPATITIS C ANTIBODY: HCV Ab: NONREACTIVE

## 2022-02-01 LAB — HEPATITIS B SURFACE ANTIBODY,QUALITATIVE: Hep B S Ab: NONREACTIVE

## 2022-02-01 MED ORDER — PENTAFLUOROPROP-TETRAFLUOROETH EX AERO: 1.0000 | INHALATION_SPRAY | CUTANEOUS | Status: DC | PRN

## 2022-02-01 MED ORDER — LIDOCAINE-EPINEPHRINE 1 %-1:100000 IJ SOLN
INTRAMUSCULAR | Status: AC
  Administered 2022-02-01: 10 mL
  Filled 2022-02-01: qty 1

## 2022-02-01 MED ORDER — HEPARIN SODIUM (PORCINE) 1000 UNIT/ML DIALYSIS
1000.0000 [IU] | INTRAMUSCULAR | Status: DC | PRN
  Filled 2022-02-01 (×2): qty 1

## 2022-02-01 MED ORDER — ANTICOAGULANT SODIUM CITRATE 4% (200MG/5ML) IV SOLN
5.0000 mL | Status: DC | PRN
Start: 2022-02-01 — End: 2022-02-07

## 2022-02-01 MED ORDER — LIDOCAINE HCL (PF) 1 % IJ SOLN: 5.0000 mL | INTRAMUSCULAR | Status: DC | PRN

## 2022-02-01 MED ORDER — ALTEPLASE 2 MG IJ SOLR: 2.0000 mg | Freq: Once | INTRAMUSCULAR | Status: DC | PRN

## 2022-02-01 MED ORDER — HEPARIN SODIUM (PORCINE) 1000 UNIT/ML IJ SOLN
INTRAMUSCULAR | Status: AC
  Administered 2022-02-01: 2800 [IU]
  Filled 2022-02-01: qty 10

## 2022-02-01 MED ORDER — CHLORHEXIDINE GLUCONATE CLOTH 2 % EX PADS
6.0000 | MEDICATED_PAD | Freq: Every day | CUTANEOUS | Status: DC
  Administered 2022-02-02 – 2022-02-06 (×5): 6 via TOPICAL

## 2022-02-01 MED ORDER — LIDOCAINE-PRILOCAINE 2.5-2.5 % EX CREA: 1.0000 | TOPICAL_CREAM | CUTANEOUS | Status: DC | PRN

## 2022-02-01 NOTE — Procedures (Signed)
Interventional Radiology Procedure Note  Procedure: Temporary hemodialysis catheter placement  Findings: Please refer to procedural dictation for full description. 12 Fr, 20 cm right IJ Trialysis catheter.  Tip in the right atrium.  Complications: None immediate  Estimated Blood Loss: < 5 mL  Recommendations: Catheter ready for immediate use.   Marliss Coots, MD

## 2022-02-01 NOTE — Progress Notes (Signed)
[facility] Progress Note   Assessment/ Plan:    AKI: bsaeline cr was 1.13 as of 11/2020.  Now 11 in the setting of recent addition of HCTZ (July 17th per records) and then CTA with contrast.               - no real symptoms for GN             - suspect 2 hits with the HCTZ and then the CTA             - send UA and UP/C--> bland with minimal proteinuria             - hold ARB and HCTZ             - s/p IVFs             - renal US- bladder scan in ED 45 mL so don't suspect retaining, no hydronephrosis  - no improvement and Cr rising- needs to do some dialysis- d/w pt and his wife.  Appreciate IR with nontunneled catheter, HD today 8/17   2.  Hyperkalemia- high 5s low 6s             - increase lokelma to TID  - albuterol neb now  3.  Metabolic acidosis:  - bicarbonate 1300 BID   4.  HTN:             - amlodipine ok    5.  Dispo: inpt  Subjective:    NO overall improvement in Cr.  UOP fine.     Objective:   BP (!) 155/80 (BP Location: Left Arm)   Pulse 77   Temp (!) 97.5 F (36.4 C) (Oral)   Resp 15   Ht 6' (1.829 m)   Wt 106.7 kg   SpO2 98%   BMI 31.91 kg/m   Intake/Output Summary (Last 24 hours) at 02/01/2022 1337 Last data filed at 02/01/2022 1335 Gross per 24 hour  Intake 760 ml  Output 1630 ml  Net -870 ml   Weight change:   Physical Exam: GEN NAD, lying flat in bed HEENT EOMI PERRL NECK no JVD PULM clear CV RRR crisp S2 ABD soft, nondistended EXT trace LE edema NEURO AAO x 3 nonfocal SKIN no rashes MSK no effusions  Imaging: US RENAL  Result Date: 01/30/2022 CLINICAL DATA:  Acute kidney injury. EXAM: RENAL / URINARY TRACT ULTRASOUND COMPLETE COMPARISON:  CT abdomen and pelvis 11/09/2020 FINDINGS: Right Kidney: Renal measurements: 11.1 x 5.0 x 5.8 cm = volume: 170 mL. Increased echogenicity. Cortical thinning. No hydronephrosis. Two cysts are identified. One in the superior pole measures 2.6 x 2.7 x 2.7 cm. One in the inferior pole  measures 3.5 x 3.2 x 3.7 cm Left Kidney: Renal measurements: 13.0 x 7.4 x 6.8 cm = volume: 338 mL. Increased echogenicity. No hydronephrosis. Two cysts are identified in the left kidney. Superior pole cyst measures 8.3 x 6.5 x 4.9 cm. Inferior pole cyst measures 4.0 x 2.9 x 3.1 cm. Bladder: 1.4 cm echogenic focus is seen in the dependent portion of the bladder with shadowing. Bladder is otherwise within normal limits. Other: Prostate gland appears enlarged measuring 5.2 x 4.5 x 3.8 cm IMPRESSION: 1. No hydronephrosis. 2. Echogenic kidneys bilaterally with right renal cortical thinning compatible with medical renal disease. 3. Bilateral renal cysts. 4. 1.4 cm calculus in the bladder. 5. Prostatomegaly. Electronically Signed   By: Darliss Cheney M.D.   On: 01/30/2022 16:59  Labs: BMET Recent Labs  Lab 01/30/22 1241 01/30/22 1751 01/31/22 0130 01/31/22 1248 02/01/22 0325  NA 140 139 138 139 138  K 6.1* 6.4* 5.7* 6.1* 5.3*  CL 108 109 108 109 108  CO2 17* 17* 16* 16* 17*  GLUCOSE 98 84 95 89 107*  BUN 120* 121* 117* 116* 118*  CREATININE 11.39* 11.47* 11.44* 11.51* 12.20*  CALCIUM 9.7 9.6 9.4 9.1 8.7*  PHOS  --   --  7.8* 7.6* 8.6*   CBC Recent Labs  Lab 01/30/22 1241 01/31/22 0130 02/01/22 0325  WBC 7.4 7.6 5.2  NEUTROABS 5.7  --   --   HGB 12.5* 12.0* 10.6*  HCT 36.2* 35.6* 30.2*  MCV 96.0 97.3 94.4  PLT 132* 135* 122*    Medications:     amLODipine  10 mg Oral QPM   apixaban  2.5 mg Oral BID   atorvastatin  20 mg Oral QPM   [START ON 02/02/2022] Chlorhexidine Gluconate Cloth  6 each Topical Q0600   cholecalciferol  1,000 Units Oral q AM   finasteride  5 mg Oral q AM   sodium bicarbonate  1,300 mg Oral BID   sodium zirconium cyclosilicate  10 g Oral TID    Bufford Buttner, MD 02/01/2022, 1:37 PM

## 2022-02-01 NOTE — Progress Notes (Addendum)
PROGRESS NOTE    Derek Washington  ONG:[id number] DOB: 04-22-1941 DOA: 01/30/2022 PCP: [name], MD    Chief Complaint  Patient presents with   Abnormal Lab    Brief Narrative:  Patient is a pleasant 81 year old gentleman history of bicuspid aortic valve status post AVR, status post PPM, hypertension, hyperlipidemia, BPH presented to the ED with abnormal labs and fatigue noted to be in acute renal failure.  Patient noted to have been seen by PCP in July and noted to be on ARB and Norvasc with HCTZ added for better blood pressure control.  Patient noted to have also undergone CT angiogram on 01/22/2022 for follow-up on AVR and aortic valve dilatation with contrast noted.  Patient presented with a week of fatigue, generalized weakness.  Noted to have significant acute kidney injury and sent to the ED.  Patient noted on admission to have a creatinine of 11.61, potassium of 6.1.  Patient admitted for further evaluation and management.  Nephrology consulted.   Assessment & Plan:   Principal Problem:   AKI (acute kidney injury) (HCC) Active Problems:   Gastroesophageal reflux disease   High cholesterol   S/P AVR (aortic valve replacement)   BPH (benign prostatic hyperplasia)   Hypertension   Hyperkalemia   Acute metabolic acidosis  #1 acute kidney injury (baseline creatinine approximately 1.13 11/2020) -Patient noted on admission with creatinine of 11.39.  Potassium of 6.4. -Renal function trending up currently at 12.67. -Likely secondary to ATN/prerenal azotemia in the setting of HCTZ and recent contrast from CT angiogram. -Patient states making urine, urine output of 1.230 L over the past 24 hours.  -Renal ultrasound negative for hydronephrosis, echogenic kidneys bilaterally with right renal cortical thinning compatible with medical renal disease, bilateral renal cysts, 1.4 cm calculus in the bladder, prostatomegaly. -Urinalysis nitrite negative, leukocytes negative, negative for  ketones, negative for protein, negative for glucose. -Urine sodium of 96, urine creatinine of 60. -Status post IV fluids with no significant improvement with renal function.   -Patient being followed by nephrology who are recommending intermittent dialysis.  IR consulted for nontunneled HD catheter which has been placed, today 02/01/2022. -Continue to hold ARB, HCTZ. -Per nephrology.  2.  Hyperkalemia -Likely secondary to problem #1. -Repeat potassium this morning 5.1 from 5.3 from 6.1 from 5.7 from 6.4.   -Continue Lokelma 3 times daily as recommended per nephrology.  -Follow.  3.  Metabolic acidosis -Likely secondary to problem #1. -Patient started on bicarb tablets per nephrology. -Follow-up.  4.  Hypertension -Continue current dose of Norvasc.   -Continue to hold HCTZ, ARB secondary to acute renal failure.  -If further blood pressure control needed may consider addition of hydralazine.  5.  History of BPH -Continue finasteride. -Renal ultrasound negative for hydronephrosis. -Follow-up.  6.  Hyperlipidemia -Continue statin.  7.  History of aortic valve replacement -Continue Eliquis.  8.  Obesity -As evidenced by BMI of 31.91 kg/m2 -Lifestyle modification -Outpatient follow-up with PCP.  DVT prophylaxis: Eliquis Code Status: DNR Family Communication: Updated patient.  No family at bedside. Disposition: Home when clinically improved, resolution of acute renal failure, when cleared by nephrology.  Status is: Inpatient Remains inpatient appropriate because: Severity of illness   Consultants:  Nephrology: Dr. Signe Colt 01/30/2022 IR  Procedures:  Chest x-ray 01/30/2022 Renal ultrasound 01/30/2022 Temporary hemodialysis catheter placement per IR, Dr. Elby Showers 02/01/2022  Antimicrobials:  None   Subjective: Sitting up in bed.  No chest pain.  No shortness of breath.  No abdominal pain.  Good urine output.    Objective: Vitals:   01/31/22 2000 02/01/22 0046 02/01/22  0459 02/01/22 1144  BP: (!) 141/85 125/80 134/81 (!) 145/87  Pulse: 64 74 67 73  Resp: 16 16 17 15   Temp: 97.6 F (36.4 C) 97.9 F (36.6 C) 97.8 F (36.6 C) 97.6 F (36.4 C)  TempSrc: Oral Oral Oral Oral  SpO2: 97% 97% 100% 100%  Weight:   106.7 kg   Height:   6' (1.829 m)     Intake/Output Summary (Last 24 hours) at 02/01/2022 1152 Last data filed at 02/01/2022 1100 Gross per 24 hour  Intake 580 ml  Output 1630 ml  Net -1050 ml    Filed Weights   02/01/22 0459  Weight: 106.7 kg    Examination:  General exam: NAD. Respiratory system: CTA B no wheezes, no crackles, no rhonchi.  Fair air movement.  Normal respiratory effort.  Cardiovascular system: RRR with 3/6 SEM right upper sternal border and left upper sternal border.  No JVD.  No edema.   Gastrointestinal system: Abdomen is soft, nontender, nondistended, positive bowel sounds.  No rebound.  No guarding.  Central nervous system: Alert and oriented. No focal neurological deficits. Extremities: Symmetric 5 x 5 power. Skin: No rashes, lesions or ulcers Psychiatry: Judgement and insight appear normal. Mood & affect appropriate.     Data Reviewed: I have personally reviewed following labs and imaging studies  CBC: Recent Labs  Lab 01/30/22 1241 01/31/22 0130 02/01/22 0325  WBC 7.4 7.6 5.2  NEUTROABS 5.7  --   --   HGB 12.5* 12.0* 10.6*  HCT 36.2* 35.6* 30.2*  MCV 96.0 97.3 94.4  PLT 132* 135* 122*     Basic Metabolic Panel: Recent Labs  Lab 01/30/22 1241 01/30/22 1751 01/31/22 0130 01/31/22 1248 02/01/22 0325  NA 140 139 138 139 138  K 6.1* 6.4* 5.7* 6.1* 5.3*  CL 108 109 108 109 108  CO2 17* 17* 16* 16* 17*  GLUCOSE 98 84 95 89 107*  BUN 120* 121* 117* 116* 118*  CREATININE 11.39* 11.47* 11.44* 11.51* 12.20*  CALCIUM 9.7 9.6 9.4 9.1 8.7*  MG  --   --  2.2  --  2.3  PHOS  --   --  7.8* 7.6* 8.6*     GFR: Estimated Creatinine Clearance: 6.1 mL/min (A) (by C-G formula based on SCr of 12.2 mg/dL  (H)).  Liver Function Tests: Recent Labs  Lab 01/30/22 1241 01/31/22 0130 01/31/22 1248 02/01/22 0325  AST 14* 13*  --   --   ALT 24 24  --   --   ALKPHOS 46 43  --   --   BILITOT 0.9 1.3*  --   --   PROT 6.5 6.2*  --   --   ALBUMIN 3.8 3.6 3.4* 2.9*     CBG: No results for input(s): "GLUCAP" in the last 168 hours.   No results found for this or any previous visit (from the past 240 hour(s)).       Radiology Studies: 02/03/22 RENAL  Result Date: 01/30/2022 CLINICAL DATA:  Acute kidney injury. EXAM: RENAL / URINARY TRACT ULTRASOUND COMPLETE COMPARISON:  CT abdomen and pelvis 11/09/2020 FINDINGS: Right Kidney: Renal measurements: 11.1 x 5.0 x 5.8 cm = volume: 170 mL. Increased echogenicity. Cortical thinning. No hydronephrosis. Two cysts are identified. One in the superior pole measures 2.6 x 2.7 x 2.7 cm. One in the inferior pole measures 3.5 x 3.2 x 3.7 cm Left  Kidney: Renal measurements: 13.0 x 7.4 x 6.8 cm = volume: 338 mL. Increased echogenicity. No hydronephrosis. Two cysts are identified in the left kidney. Superior pole cyst measures 8.3 x 6.5 x 4.9 cm. Inferior pole cyst measures 4.0 x 2.9 x 3.1 cm. Bladder: 1.4 cm echogenic focus is seen in the dependent portion of the bladder with shadowing. Bladder is otherwise within normal limits. Other: Prostate gland appears enlarged measuring 5.2 x 4.5 x 3.8 cm IMPRESSION: 1. No hydronephrosis. 2. Echogenic kidneys bilaterally with right renal cortical thinning compatible with medical renal disease. 3. Bilateral renal cysts. 4. 1.4 cm calculus in the bladder. 5. Prostatomegaly. Electronically Signed   By: Darliss Cheney M.D.   On: 01/30/2022 16:59   DG Chest 2 View  Result Date: 01/30/2022 CLINICAL DATA:  Shortness of breath. EXAM: CHEST - 2 VIEW COMPARISON:  Chest radiograph 01/18/2021 and chest CT 01/22/2022 FINDINGS: Stable appearance of the left chest dual chamber cardiac pacemaker. Patient has had a median sternotomy and aortic valve  replacement. Heart size is stable. Atherosclerotic calcifications at the aortic arch. Both lungs are clear without airspace disease or pulmonary edema. No large pleural effusions. Bridging osteophytes in the thoracic spine. IMPRESSION: No active cardiopulmonary disease. Electronically Signed   By: Richarda Overlie M.D.   On: 01/30/2022 12:51        Scheduled Meds:  amLODipine  10 mg Oral QPM   apixaban  2.5 mg Oral BID   atorvastatin  20 mg Oral QPM   cholecalciferol  1,000 Units Oral q AM   finasteride  5 mg Oral q AM   sodium bicarbonate  1,300 mg Oral BID   sodium zirconium cyclosilicate  10 g Oral TID   Continuous Infusions:     LOS: 2 days    Time spent: 35 minutes    Ramiro Harvest, MD [facility]   To contact the attending provider between 7A-7P or the covering provider during after hours 7P-7A, please log into the web site [email or web address] and access using universal [place] password for that web site. If you do not have the password, please call the hospital operator.  02/01/2022, 11:52 AM

## 2022-02-01 NOTE — Progress Notes (Signed)
   Patient Status: [facility] - In-pt  Assessment and Plan: Patient in need of venous access.   Non tunneled dialysis catheter placement  ______________________________________________________________________   History of Present Illness: Derek Washington is a 81 y.o. male   Acute kidney injury AKI: bsaeline cr was 1.13 as of 11/2020.  Now 11 in the setting of recent addition of HCTZ (July 17th per records) and then CTA with contrast.   Followed by Nephrology Request made for non tunneled dialysis catheter placement for likely dialysis short term  Scheduled now for aame  Allergies and medications reviewed.   Review of Systems: A 12 point ROS discussed and pertinent positives are indicated in the HPI above.  All other systems are negative.  Vital Signs: BP (!) 145/87 (BP Location: Right Arm)   Pulse 73   Temp 97.6 F (36.4 C) (Oral)   Resp 15   Ht 6' (1.829 m)   Wt 235 lb 4.8 oz (106.7 kg)   SpO2 100%   BMI 31.91 kg/m   Physical Exam Vitals reviewed.  Musculoskeletal:        General: Normal range of motion.  Skin:    General: Skin is warm.  Neurological:     Mental Status: He is alert and oriented to person, place, and time.  Psychiatric:        Behavior: Behavior normal.      Imaging reviewed.   Labs:  COAGS: No results for input(s): "INR", "APTT" in the last 8760 hours.  BMP: Recent Labs    01/30/22 1751 01/31/22 0130 01/31/22 1248 02/01/22 0325  NA 139 138 139 138  K 6.4* 5.7* 6.1* 5.3*  CL 109 108 109 108  CO2 17* 16* 16* 17*  GLUCOSE 84 95 89 107*  BUN 121* 117* 116* 118*  CALCIUM 9.6 9.4 9.1 8.7*  CREATININE 11.47* 11.44* 11.51* 12.20*  GFRNONAA 4* 4* 4* 4*    Scheduled for non tunneled dialysis catheter placement Pt is aware of procedure benefits and risks including but not limited to Infection; bleeding; vessel damage He is agreeable to proceed Consent signed and in IR   Electronically Signed: Robet Leu, PA-C 02/01/2022, 12:24  PM   I spent a total of 15 minutes in face to face in clinical consultation, greater than 50% of which was counseling/coordinating care for venous access.

## 2022-02-02 DIAGNOSIS — E8721 Acute metabolic acidosis: Secondary | ICD-10-CM | POA: Diagnosis not present

## 2022-02-02 DIAGNOSIS — N400 Enlarged prostate without lower urinary tract symptoms: Secondary | ICD-10-CM | POA: Diagnosis not present

## 2022-02-02 DIAGNOSIS — N179 Acute kidney failure, unspecified: Secondary | ICD-10-CM | POA: Diagnosis not present

## 2022-02-02 DIAGNOSIS — E7800 Pure hypercholesterolemia, unspecified: Secondary | ICD-10-CM | POA: Diagnosis not present

## 2022-02-02 LAB — ANCA PROFILE
Anti-MPO Antibodies: <0.2 units (ref 0.0–0.9)
Anti-PR3 Antibodies: <0.2 units (ref 0.0–0.9)
Atypical P-ANCA titer: <1:20 {titer}
C-ANCA: <1:20 {titer}
P-ANCA: <1:20 {titer}

## 2022-02-02 LAB — BASIC METABOLIC PANEL
Anion gap: 11 (ref 5–15)
BUN: 80 mg/dL — ABNORMAL HIGH (ref 8–23)
CO2: 22 mmol/L (ref 22–32)
Calcium: 8.5 mg/dL — ABNORMAL LOW (ref 8.9–10.3)
Chloride: 103 mmol/L (ref 98–111)
Creatinine, Ser: 9.29 mg/dL — ABNORMAL HIGH (ref 0.61–1.24)
GFR, Estimated: 5 mL/min — ABNORMAL LOW (ref >60)
Glucose, Bld: 87 mg/dL (ref 70–99)
Potassium: 3.7 mmol/L (ref 3.5–5.1)
Sodium: 136 mmol/L (ref 135–145)

## 2022-02-02 LAB — HEPATITIS B SURFACE ANTIBODY, QUANTITATIVE: Hep B S AB Quant (Post): <3.1 m[IU]/mL — ABNORMAL LOW (ref >9.9)

## 2022-02-02 MED ORDER — SODIUM ZIRCONIUM CYCLOSILICATE 10 G PO PACK
10.0000 g | PACK | Freq: Every day | ORAL | Status: DC
  Administered 2022-02-03: 10 g via ORAL
  Filled 2022-02-02 (×2): qty 1

## 2022-02-02 NOTE — Evaluation (Signed)
Physical Therapy Evaluation Patient Details Name: Derek Washington MRN: [id number] DOB: 07/16/1940 Today's Date: 02/02/2022  History of Present Illness  81 yo male presents to [facility] hospital on 01/30/2022 with renal failure and weight gain. PMH includes aortic valve replacement, HLD, HTN.  Clinical Impression  Pt presents to PT mobilizing independently at this time. Pt tolerates dynamic gait challenges without issue. PT encourages the patient to mobilize frequently during this admission. Pt has no further acute PT needs. PT signing off at this time.        Recommendations for follow up therapy are one component of a multi-disciplinary discharge planning process, led by the attending physician.  Recommendations may be updated based on patient status, additional functional criteria and insurance authorization.  Follow Up Recommendations No PT follow up      Assistance Recommended at Discharge None  Patient can return home with the following       Equipment Recommendations None recommended by PT  Recommendations for Other Services       Functional Status Assessment Patient has not had a recent decline in their functional status     Precautions / Restrictions Precautions Precautions: None Restrictions Weight Bearing Restrictions: No      Mobility  Bed Mobility Overal bed mobility: Independent                  Transfers Overall transfer level: Independent                      Ambulation/Gait Ambulation/Gait assistance: Independent Gait Distance (Feet): 500 Feet Assistive device: None Gait Pattern/deviations: WFL(Within Functional Limits) Gait velocity: functional Gait velocity interpretation: >2.62 ft/sec, indicative of community ambulatory   General Gait Details: steady step-through gait, tolerates multiple dynamic gait challenges including head turns, changes in direction/gait speed/stride length all without LOB  Stairs Stairs: Yes Stairs assistance:  Modified independent (Device/Increase time) Stair Management: One rail Left, Alternating pattern Number of Stairs: 10    Wheelchair Mobility    Modified Rankin (Stroke Patients Only)       Balance Overall balance assessment: Modified Independent (30 second rhomber eyes closed, picks up pen from floor without LOB, unable to maintain SLS without UE support at baseline)                                           Pertinent Vitals/Pain Pain Assessment Pain Assessment: No/denies pain    Home Living Family/patient expects to be discharged to:: Private residence Living Arrangements: Spouse/significant other Available Help at Discharge: Family;Available 24 hours/day Type of Home: House Home Access: Stairs to enter Entrance Stairs-Rails: None Entrance Stairs-Number of Steps: 2 Alternate Level Stairs-Number of Steps: 15 Home Layout: Two level Home Equipment: None      Prior Function Prior Level of Function : Independent/Modified Independent;Driving             Mobility Comments: enjoys Building control surveyor        Extremity/Trunk Assessment   Upper Extremity Assessment Upper Extremity Assessment: Overall WFL for tasks assessed    Lower Extremity Assessment Lower Extremity Assessment: Overall WFL for tasks assessed    Cervical / Trunk Assessment Cervical / Trunk Assessment: Normal  Communication   Communication: No difficulties  Cognition Arousal/Alertness: Awake/alert Behavior During Therapy: WFL for tasks assessed/performed Overall Cognitive Status: Within Functional Limits for tasks assessed  General Comments General comments (skin integrity, edema, etc.): VSS on RA    Exercises     Assessment/Plan    PT Assessment Patient does not need any further PT services  PT Problem List         PT Treatment Interventions      PT Goals (Current goals can be found in the  Care Plan section)       Frequency       Co-evaluation               AM-PAC PT "6 Clicks" Mobility  Outcome Measure Help needed turning from your back to your side while in a flat bed without using bedrails?: None Help needed moving from lying on your back to sitting on the side of a flat bed without using bedrails?: None Help needed moving to and from a bed to a chair (including a wheelchair)?: None Help needed standing up from a chair using your arms (e.g., wheelchair or bedside chair)?: None Help needed to walk in hospital room?: None Help needed climbing 3-5 steps with a railing? : None 6 Click Score: 24    End of Session   Activity Tolerance: Patient tolerated treatment well Patient left: in bed;with call bell/phone within reach;with family/visitor present Nurse Communication: Mobility status      Time: 1003-1015 PT Time Calculation (min) (ACUTE ONLY): 12 min   Charges:   PT Evaluation $PT Eval Low Complexity: 1 Low          Arlyss Gandy, PT, DPT Acute Rehabilitation Office [phone]   Arlyss Gandy 02/02/2022, 10:21 AM

## 2022-02-02 NOTE — Progress Notes (Signed)
PROGRESS NOTE    Derek Washington  ZTI:[id number] DOB: Mar 17, 1941 DOA: 01/30/2022 PCP: Josetta Huddle, MD    Chief Complaint  Patient presents with   Abnormal Lab    Brief Narrative:  Patient is a pleasant 81 year old gentleman history of bicuspid aortic valve status post AVR, status post PPM, hypertension, hyperlipidemia, BPH presented to the ED with abnormal labs and fatigue noted to be in acute renal failure.  Patient noted to have been seen by PCP in July and noted to be on ARB and Norvasc with HCTZ added for better blood pressure control.  Patient noted to have also undergone CT angiogram on 01/22/2022 for follow-up on AVR and aortic valve dilatation with contrast noted.  Patient presented with a week of fatigue, generalized weakness.  Noted to have significant acute kidney injury and sent to the ED.  Patient noted on admission to have a creatinine of 11.61, potassium of 6.1.  Patient admitted for further evaluation and management.  Nephrology consulted.   Assessment & Plan:   Principal Problem:   AKI (acute kidney injury) ([place]) Active Problems:   Gastroesophageal reflux disease   High cholesterol   S/P AVR (aortic valve replacement)   BPH (benign prostatic hyperplasia)   Hypertension   Hyperkalemia   Acute metabolic acidosis  #1 acute kidney injury (baseline creatinine approximately 1.13 11/2020) -Patient noted on admission with creatinine of 11.39.  Potassium of 6.4. -Renal function was initially trending up to 12.67, underwent intermittent hemodialysis on 02/01/2022 with creatinine currently at 9.29. -Likely secondary to ATN/prerenal azotemia in the setting of HCTZ and recent contrast from CT angiogram. -Patient states making urine, urine output of 700 cc recorded over the past 24 hours.  -Renal ultrasound negative for hydronephrosis, echogenic kidneys bilaterally with right renal cortical thinning compatible with medical renal disease, bilateral renal cysts, 1.4 cm calculus in the  bladder, prostatomegaly. -Urinalysis nitrite negative, leukocytes negative, negative for ketones, negative for protein, negative for glucose. -Urine sodium of 96, urine creatinine of 60. -Status post IV fluids with no significant improvement with renal function.   -Patient being followed by nephrology who are recommended intermittent dialysis which was done yesterday 02/01/2022 with improvement with renal function..  IR consulted for nontunneled HD catheter which has been placed, 02/01/2022. -Continue to hold ARB, HCTZ. -Nephrology following and recommending to observe rate of rise/UOP tomorrow to determine if any further hemodialysis is needed. -Per nephrology.  2.  Hyperkalemia -Likely secondary to problem #1. -Patient underwent hemodialysis yesterday. -Repeat potassium this morning 3.7 from 5.1 from 5.3 from 6.1 from 5.7 from 6.4.   -Lokelma decreased to daily per nephrology.  -Follow.  3.  Metabolic acidosis -Likely secondary to problem #1. -Improved on bicarb tablets.  -Follow.  4.  Hypertension -Continue current dose of Norvasc.   -Continue to hold HCTZ, ARB secondary to acute renal failure.  -If further blood pressure control needed may consider addition of hydralazine.  5.  History of BPH -Continue finasteride. -Renal ultrasound negative for hydronephrosis. -Follow-up.  6.  Hyperlipidemia -Statin.  7.  History of aortic valve replacement -Continue Eliquis.    8.  Obesity -As evidenced by BMI of 31.91 kg/m2 -Lifestyle modification -Outpatient follow-up with PCP.  DVT prophylaxis: Eliquis Code Status: DNR Family Communication: Updated patient.  No family at bedside. Disposition: Home when clinically improved, resolution of acute renal failure, when cleared by nephrology.  Status is: Inpatient Remains inpatient appropriate because: Severity of illness   Consultants:  Nephrology: Dr. Hollie Salk 01/30/2022 IR  Procedures:  Chest x-ray 01/30/2022 Renal ultrasound  01/30/2022 Temporary hemodialysis catheter placement per IR, Dr. Serafina Royals 02/01/2022  Antimicrobials:  None   Subjective: Sitting up in bed.  No chest pain.  No shortness of breath.  No abdominal pain.  States having good urine output.  Tolerated dialysis yesterday.  Wife at bedside.    Objective: Vitals:   02/02/22 0045 02/02/22 0130 02/02/22 0146 02/02/22 0508  BP: (!) 140/92 (!) 168/84 (!) 168/84 (!) 148/78  Pulse: 64 66 66 74  Resp: _0 Temp:  98 F (36.7 C) 97.8 F (36.6 C) 98 F (36.7 C)  TempSrc:  Oral Oral Oral  SpO2: 98% 99% 98% 98%  Weight:    105.9 kg  Height:        Intake/Output Summary (Last 24 hours) at 02/02/2022 1042 Last data filed at 02/02/2022 0130 Gross per 24 hour  Intake 400 ml  Output 700 ml  Net -300 ml    Filed Weights   02/01/22 0459 02/02/22 0508  Weight: 106.7 kg 105.9 kg    Examination:  General exam: NAD. Respiratory system: Lungs clear to auscultation bilaterally.  No wheezes, no crackles, no rhonchi.  Fair air movement.  Speaking in full sentences. Cardiovascular system: RRR with 3/6 SEM right upper sternal border and left upper sternal border.  No JVD.  No edema.   Gastrointestinal system: Abdomen is soft, nontender, nondistended, positive bowel sounds.  No rebound.  No guarding.  Central nervous system: Alert and oriented. No focal neurological deficits. Extremities: Symmetric 5 x 5 power. Skin: No rashes, lesions or ulcers Psychiatry: Judgement and insight appear normal. Mood & affect appropriate.     Data Reviewed: I have personally reviewed following labs and imaging studies  CBC: Recent Labs  Lab 01/30/22 1241 01/31/22 0130 02/01/22 0325 02/01/22 1412  WBC 7.4 7.6 5.2 5.6  NEUTROABS 5.7  --   --   --   HGB 12.5* 12.0* 10.6* 11.7*  HCT 36.2* 35.6* 30.2* 33.1*  MCV 96.0 97.3 94.4 93.8  PLT 132* 135* 122* 123*     Basic Metabolic Panel: Recent Labs  Lab 01/31/22 0130 01/31/22 1248 02/01/22 0325  02/01/22 1412 02/02/22 0342  NA 138 139 138 138 136  K 5.7* 6.1* 5.3* 5.1 3.7  CL 108 109 108 106 103  CO2 16* 16* 17* 18* 22  GLUCOSE 95 89 107* 98 87  BUN 117* 116* 118* 117* 80*  CREATININE 11.44* 11.51* 12.20* 12.67* 9.29*  CALCIUM 9.4 9.1 8.7* 9.0 8.5*  MG 2.2  --  2.3  --   --   PHOS 7.8* 7.6* 8.6* 8.4*  --      GFR: Estimated Creatinine Clearance: 8 mL/min (A) (by C-G formula based on SCr of 9.29 mg/dL (H)).  Liver Function Tests: Recent Labs  Lab 01/30/22 1241 01/31/22 0130 01/31/22 1248 02/01/22 0325 02/01/22 1412  AST 14* 13*  --   --   --   ALT 24 24  --   --   --   ALKPHOS 46 43  --   --   --   BILITOT 0.9 1.3*  --   --   --   PROT 6.5 6.2*  --   --   --   ALBUMIN 3.8 3.6 3.4* 2.9* 3.4*     CBG: No results for input(s): "GLUCAP" in the last 168 hours.   No results found for this or any previous visit (from the past 240 hour(s)).  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 EXAM: NON-TUNNELED CENTRAL VENOUS HEMODIALYSIS CATHETER PLACEMENT WITH ULTRASOUND AND FLUOROSCOPIC GUIDANCE COMPARISON:  None Available. MEDICATIONS: None FLUOROSCOPY TIME:  0.2 minutes, 1 mGy COMPLICATIONS: None immediate. PROCEDURE: Informed written consent was obtained from the patient after a discussion of the risks, benefits, and alternatives to treatment. Questions regarding the procedure were encouraged and answered. The right neck and chest were prepped with chlorhexidine in a sterile fashion, and a sterile drape was applied covering the operative field. Maximum barrier sterile technique with sterile gowns and gloves were used for the procedure. A timeout was performed prior to the initiation of the procedure. After the overlying soft tissues were anesthetized, a small venotomy incision was created and a micropuncture kit was utilized to  access the internal jugular vein. Real-time ultrasound guidance was utilized for vascular access including the acquisition of a permanent ultrasound image documenting patency of the accessed vessel. The microwire was utilized to measure appropriate catheter length. A stiff glidewire was advanced to the level of the IVC. Under fluoroscopic guidance, the venotomy was serially dilated, ultimately allowing placement of a 20 cm temporary Trialysis catheter with tip ultimately terminating within the superior aspect of the right atrium. Final catheter positioning was confirmed and documented with a spot radiographic image. The catheter aspirates and flushes normally. The catheter was flushed with appropriate volume heparin dwells. The catheter exit site was secured with a 0-Prolene retention suture. A dressing was placed. The patient tolerated the procedure well without immediate post procedural complication. IMPRESSION: Successful placement of a right internal jugular approach 20 cm temporary dialysis catheter with tip terminating with in the superior aspect of the right atrium. The catheter is ready for immediate use. PLAN: This catheter may be converted to a tunneled dialysis catheter at a later date as indicated. Ruthann Cancer, MD [facility] Electronically Signed   By: Ruthann Cancer M.D.   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 EXAM: NON-TUNNELED CENTRAL VENOUS HEMODIALYSIS CATHETER PLACEMENT WITH ULTRASOUND AND FLUOROSCOPIC GUIDANCE COMPARISON:  None Available. MEDICATIONS: None FLUOROSCOPY TIME:  0.2 minutes, 1 mGy COMPLICATIONS: None immediate. PROCEDURE: Informed written consent was obtained from the patient after a discussion of the risks, benefits, and alternatives to treatment. Questions regarding the  procedure were encouraged and answered. The right neck and chest were prepped with chlorhexidine in a sterile fashion, and a sterile drape was applied covering the operative field. Maximum barrier sterile technique with sterile gowns and gloves were used for the procedure. A timeout was performed prior to the initiation of the procedure. After the overlying soft tissues were anesthetized, a small venotomy incision was created and a micropuncture kit was utilized to access the internal jugular vein. Real-time ultrasound guidance was utilized for vascular access including the acquisition of a permanent ultrasound image documenting patency of the accessed vessel. The microwire was utilized to measure appropriate catheter length. A stiff glidewire was advanced to the level of the IVC. Under fluoroscopic guidance, the venotomy was serially dilated, ultimately allowing placement of a 20 cm temporary Trialysis catheter with tip ultimately terminating within the superior aspect of the right atrium. Final catheter positioning was confirmed and documented with a spot radiographic image. The catheter aspirates and flushes  normally. The catheter was flushed with appropriate volume heparin dwells. The catheter exit site was secured with a 0-Prolene retention suture. A dressing was placed. The patient tolerated the procedure well without immediate post procedural complication. IMPRESSION: Successful placement of a right internal jugular approach 20 cm temporary dialysis catheter with tip terminating with in the superior aspect of the right atrium. The catheter is ready for immediate use. PLAN: This catheter may be converted to a tunneled dialysis catheter at a later date as indicated. Ruthann Cancer, MD [facility] Electronically Signed   By: Ruthann Cancer M.D.   On: 02/01/2022 13:41        Scheduled Meds:  amLODipine  10 mg Oral QPM   apixaban  2.5 mg Oral BID    atorvastatin  20 mg Oral QPM   Chlorhexidine Gluconate Cloth  6 each Topical Q0600   cholecalciferol  1,000 Units Oral q AM   finasteride  5 mg Oral q AM   sodium bicarbonate  1,300 mg Oral BID   sodium zirconium cyclosilicate  10 g Oral TID   Continuous Infusions:  anticoagulant sodium citrate        LOS: 3 days    Time spent: 35 minutes    Irine Seal, MD [facility]   To contact the attending provider between 7A-7P or the covering provider during after hours 7P-7A, please log into the web site [email or web address] and access using universal  password for that web site. If you do not have the password, please call the hospital operator.  02/02/2022, 10:42 AM

## 2022-02-02 NOTE — Care Management (Signed)
  Transition of Care [facility]) Screening Note   Patient Details  Name: Derek Washington Date of Birth: [birth year]   Transition of Care [facility]) CM/SW Contact:    Gala Lewandowsky, RN Phone Number: 02/02/2022, 1:25 PM    Transition of Care Department [facility]) has reviewed the patient and no TOC needs have been identified at this time. We will continue to monitor patient advancement through interdisciplinary progression rounds. If new patient transition needs arise, please place a TOC consult.

## 2022-02-02 NOTE — Progress Notes (Signed)
OT Cancellation Note  Patient Details Name: Derek Washington MRN: [id number] DOB: [birth year]   Cancelled Treatment:    Reason Eval/Treat Not Completed: OT screened, no needs identified, will sign off. Received chat text from evaluating PT earlierr today that pt at baseline and probably did not need OT services. Popped into pt's room, explained OT and he agreed he does not need OT services.  Ignacia Palma, OTR/L [facility] [phone] Office [phone]    Evette Georges 02/02/2022, 2:14 PM

## 2022-02-02 NOTE — Progress Notes (Signed)
[facility] Progress Note   Assessment/ Plan:    AKI: bsaeline cr was 1.13 as of 11/2020.  Now 11 in the setting of recent addition of HCTZ (July 17th per records) and then CTA with contrast.               - no real symptoms for GN             - suspect 2 hits with the HCTZ and then the CTA             - send UA and UP/C--> bland with minimal proteinuria             - hold ARB and HCTZ             - s/p IVFs             - renal US- bladder scan in ED 45 mL so don't suspect retaining, no hydronephrosis  - s/p HD #1 8/17  - observe rate of rise/ UOP tomorrow to determine further dialysis needs- discussed with pt and wife   2.  Hyperkalemia- improved             - back off on lokelma to daily  3.  Metabolic acidosis:  - bicarbonate 1300 BID   4.  HTN:             - amlodipine ok    5.  Dispo: inpt  Subjective:   HD yesterday, did well.  Still having UOP   Objective:   BP (!) 148/78 (BP Location: Left Arm)   Pulse 74   Temp 98 F (36.7 C) (Oral)   Resp 15   Ht 6' (1.829 m)   Wt 105.9 kg   SpO2 98%   BMI 31.67 kg/m   Intake/Output Summary (Last 24 hours) at 02/02/2022 1209 Last data filed at 02/02/2022 0900 Gross per 24 hour  Intake 300 ml  Output 500 ml  Net -200 ml   Weight change: -0.816 kg  Physical Exam: GEN NAD, lying flat in bed HEENT EOMI PERRL NECK no JVD PULM clear CV RRR crisp S2 ABD soft, nondistended EXT trace LE edema NEURO AAO x 3 nonfocal SKIN no rashes MSK no effusions  Imaging: IR Fluoro Guide CV Line Right  Result Date: 02/01/2022 INDICATION: 81 year old male with 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 EXAM: NON-TUNNELED CENTRAL VENOUS HEMODIALYSIS CATHETER PLACEMENT WITH ULTRASOUND AND FLUOROSCOPIC GUIDANCE COMPARISON:  None Available. MEDICATIONS: None FLUOROSCOPY TIME:  0.2 minutes, 1 mGy COMPLICATIONS: None immediate. PROCEDURE: Informed written consent was obtained from the patient after a  discussion of the risks, benefits, and alternatives to treatment. Questions regarding the procedure were encouraged and answered. The right neck and chest were prepped with chlorhexidine in a sterile fashion, and a sterile drape was applied covering the operative field. Maximum barrier sterile technique with sterile gowns and gloves were used for the procedure. A timeout was performed prior to the initiation of the procedure. After the overlying soft tissues were anesthetized, a small venotomy incision was created and a micropuncture kit was utilized to access the internal jugular vein. Real-time ultrasound guidance was utilized for vascular access including the acquisition of a permanent ultrasound image documenting patency of the accessed vessel. The microwire was utilized to measure appropriate catheter length. A stiff glidewire was advanced to the level of the IVC. Under fluoroscopic guidance, the venotomy was serially dilated, ultimately allowing placement of a 20 cm temporary  Trialysis catheter with tip ultimately terminating within the superior aspect of the right atrium. Final catheter positioning was confirmed and documented with a spot radiographic image. The catheter aspirates and flushes normally. The catheter was flushed with appropriate volume heparin dwells. The catheter exit site was secured with a 0-Prolene retention suture. A dressing was placed. The patient tolerated the procedure well without immediate post procedural complication. IMPRESSION: Successful placement of a right internal jugular approach 20 cm temporary dialysis catheter with tip terminating with in the superior aspect of the right atrium. The catheter is ready for immediate use. PLAN: This catheter may be converted to a tunneled dialysis catheter at a later date as indicated. Ruthann Cancer, MD [facility] Radiology Electronically Signed   By: Ruthann Cancer M.D.   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 EXAM: NON-TUNNELED CENTRAL VENOUS HEMODIALYSIS CATHETER PLACEMENT WITH ULTRASOUND AND FLUOROSCOPIC GUIDANCE COMPARISON:  None Available. MEDICATIONS: None FLUOROSCOPY TIME:  0.2 minutes, 1 mGy COMPLICATIONS: None immediate. PROCEDURE: Informed written consent was obtained from the patient after a discussion of the risks, benefits, and alternatives to treatment. Questions regarding the procedure were encouraged and answered. The right neck and chest were prepped with chlorhexidine in a sterile fashion, and a sterile drape was applied covering the operative field. Maximum barrier sterile technique with sterile gowns and gloves were used for the procedure. A timeout was performed prior to the initiation of the procedure. After the overlying soft tissues were anesthetized, a small venotomy incision was created and a micropuncture kit was utilized to access the internal jugular vein. Real-time ultrasound guidance was utilized for vascular access including the acquisition of a permanent ultrasound image documenting patency of the accessed vessel. The microwire was utilized to measure appropriate catheter length. A stiff glidewire was advanced to the level of the IVC. Under fluoroscopic guidance, the venotomy was serially dilated, ultimately allowing placement of a 20 cm temporary Trialysis catheter with tip ultimately terminating within the superior aspect of the right atrium. Final catheter positioning was confirmed and documented with a spot radiographic image. The catheter aspirates and flushes normally. The catheter was flushed with appropriate volume heparin dwells. The catheter exit site was secured with a 0-Prolene retention suture. A dressing was placed. The patient tolerated the procedure well without immediate post procedural complication.  IMPRESSION: Successful placement of a right internal jugular approach 20 cm temporary dialysis catheter with tip terminating with in the superior aspect of the right atrium. The catheter is ready for immediate use. PLAN: This catheter may be converted to a tunneled dialysis catheter at a later date as indicated. Ruthann Cancer, MD [facility] Radiology Electronically Signed   By: Ruthann Cancer M.D.   On: 02/01/2022 13:41    Labs: BMET Recent Labs  Lab 01/30/22 1241 01/30/22 1751 01/31/22 0130 01/31/22 1248 02/01/22 0325 02/01/22 1412 02/02/22 0342  NA 140 139 138 139 138 138 136  K 6.1* 6.4* 5.7* 6.1* 5.3* 5.1 3.7  CL 108 109 108 109 108 106 103  CO2 17* 17* 16* 16* 17* 18* 22  GLUCOSE 98 84 95 89 107* 98 87  BUN 120* 121* 117* 116* 118* 117* 80*  CREATININE 11.39* 11.47* 11.44* 11.51* 12.20* 12.67* 9.29*  CALCIUM 9.7 9.6 9.4 9.1 8.7* 9.0 8.5*  PHOS  --   --  7.8* 7.6* 8.6* 8.4*  --    CBC Recent Labs  Lab 01/30/22 1241 01/31/22 0130 02/01/22 0325 02/01/22 1412  WBC 7.4 7.6 5.2 5.6  NEUTROABS 5.7  --   --   --   HGB 12.5* 12.0* 10.6* 11.7*  HCT 36.2* 35.6* 30.2* 33.1*  MCV 96.0 97.3 94.4 93.8  PLT 132* 135* 122* 123*    Medications:     amLODipine  10 mg Oral QPM   apixaban  2.5 mg Oral BID   atorvastatin  20 mg Oral QPM   Chlorhexidine Gluconate Cloth  6 each Topical Q0600   cholecalciferol  1,000 Units Oral q AM   finasteride  5 mg Oral q AM   sodium bicarbonate  1,300 mg Oral BID   sodium zirconium cyclosilicate  10 g Oral TID    Madelon Lips, MD 02/02/2022, 12:09 PM

## 2022-02-02 NOTE — Care Management Important Message (Signed)
Important Message  Patient Details  Name: Derek Washington MRN: [id number] Date of Birth: 01-Sep-1940   Medicare Important Message Given:  Yes     Keefe Zawistowski Stefan Church 02/02/2022, 2:41 PM

## 2022-02-03 DIAGNOSIS — N179 Acute kidney failure, unspecified: Secondary | ICD-10-CM | POA: Diagnosis not present

## 2022-02-03 DIAGNOSIS — E8721 Acute metabolic acidosis: Secondary | ICD-10-CM | POA: Diagnosis not present

## 2022-02-03 DIAGNOSIS — E7800 Pure hypercholesterolemia, unspecified: Secondary | ICD-10-CM | POA: Diagnosis not present

## 2022-02-03 DIAGNOSIS — N400 Enlarged prostate without lower urinary tract symptoms: Secondary | ICD-10-CM | POA: Diagnosis not present

## 2022-02-03 LAB — BASIC METABOLIC PANEL
Anion gap: 16 — ABNORMAL HIGH (ref 5–15)
BUN: 87 mg/dL — ABNORMAL HIGH (ref 8–23)
CO2: 22 mmol/L (ref 22–32)
Calcium: 8.7 mg/dL — ABNORMAL LOW (ref 8.9–10.3)
Chloride: 101 mmol/L (ref 98–111)
Creatinine, Ser: 10.31 mg/dL — ABNORMAL HIGH (ref 0.61–1.24)
GFR, Estimated: 5 mL/min — ABNORMAL LOW (ref >60)
Glucose, Bld: 87 mg/dL (ref 70–99)
Potassium: 4.6 mmol/L (ref 3.5–5.1)
Sodium: 139 mmol/L (ref 135–145)

## 2022-02-03 MED ORDER — FUROSEMIDE 10 MG/ML IJ SOLN: 80.0000 mg | Freq: Every day | INTRAMUSCULAR | Status: DC

## 2022-02-03 MED ORDER — HYDRALAZINE HCL 25 MG PO TABS
25.0000 mg | ORAL_TABLET | Freq: Every day | ORAL | Status: DC
  Administered 2022-02-03: 25 mg via ORAL
  Filled 2022-02-03 (×2): qty 1

## 2022-02-03 NOTE — Progress Notes (Signed)
PROGRESS NOTE    Derek Washington  DJM:[id number] DOB: 12-25-1940 DOA: 01/30/2022 PCP: Josetta Huddle, MD    Chief Complaint  Patient presents with   Abnormal Lab    Brief Narrative:  Patient is a pleasant 81 year old gentleman history of bicuspid aortic valve status post AVR, status post PPM, hypertension, hyperlipidemia, BPH presented to the ED with abnormal labs and fatigue noted to be in acute renal failure.  Patient noted to have been seen by PCP in July and noted to be on ARB and Norvasc with HCTZ added for better blood pressure control.  Patient noted to have also undergone CT angiogram on 01/22/2022 for follow-up on AVR and aortic valve dilatation with contrast noted.  Patient presented with a week of fatigue, generalized weakness.  Noted to have significant acute kidney injury and sent to the ED.  Patient noted on admission to have a creatinine of 11.61, potassium of 6.1.  Patient admitted for further evaluation and management.  Nephrology consulted.   Assessment & Plan:   Principal Problem:   AKI (acute kidney injury) ([place]) Active Problems:   Gastroesophageal reflux disease   High cholesterol   S/P AVR (aortic valve replacement)   BPH (benign prostatic hyperplasia)   Hypertension   Hyperkalemia   Acute metabolic acidosis  1 acute kidney injury (baseline creatinine approximately 1.13 11/2020) -Patient noted on admission with creatinine of 11.39.  Potassium of 6.4. -Renal function was initially trending up to 12.67, underwent intermittent hemodialysis #1, 02/01/2022 with creatinine dropping down to 9.29 and back up again today at 10.31.  -Likely secondary to ATN/prerenal azotemia in the setting of HCTZ and recent contrast from CT angiogram. -Patient states making urine, urine output of 950 cc recorded over the past 24 hours.  -Renal ultrasound negative for hydronephrosis, echogenic kidneys bilaterally with right renal cortical thinning compatible with medical renal disease,  bilateral renal cysts, 1.4 cm calculus in the bladder, prostatomegaly. -Urinalysis nitrite negative, leukocytes negative, negative for ketones, negative for protein, negative for glucose. -Urine sodium of 96, urine creatinine of 60. -Status post IV fluids with no significant improvement with renal function.   -Patient being followed by nephrology who are recommended intermittent dialysis with HD #1 on 02/01/2022 with improvement with renal function initially and trending back up today with creatinine at 10.31..   -IR consulted for nontunneled HD catheter which has been placed, 02/01/2022. -Patient for HD #2 today 02/03/2022 per nephrology and likely Lasix tomorrow. -Continue to hold ARB, HCTZ and likely will not resume on discharge. -Per nephrology.  2.  Hyperkalemia -Likely secondary to problem #1. -Patient underwent hemodialysis #1 on 02/01/2022. -Repeat potassium this morning at 4.6 from 3.7 from 5.1 from 5.3 from 6.1 from 5.7 from 6.4.   -Lokelma decreased to daily per nephrology.  -Follow.  3.  Metabolic acidosis -Likely secondary to problem #1. -Improved on bicarb tablets.  -Follow.  4.  Hypertension -Continue current dose of Norvasc.   -Continue to hold HCTZ, ARB secondary to acute renal failure and likely will not resume on discharge. -Start hydralazine 25 mg daily.  5.  History of BPH -Continue finasteride. -Renal ultrasound negative for hydronephrosis. -Follow-up.  6.  Hyperlipidemia -Statin.  7.  History of aortic valve replacement -Eliquis.  8.  Obesity -As evidenced by BMI of 31.91 kg/m2 -Lifestyle modification -Outpatient follow-up with PCP.  DVT prophylaxis: Eliquis Code Status: DNR Family Communication: Updated patient and wife at bedside. Disposition: Home when clinically improved, resolution of acute renal failure, when cleared by nephrology.  Status is: Inpatient Remains inpatient appropriate because: Severity of illness   Consultants:  Nephrology:  Dr. Hollie Salk 01/30/2022 IR  Procedures:  Chest x-ray 01/30/2022 Renal ultrasound 01/30/2022 Temporary hemodialysis catheter placement per IR, Dr. Serafina Royals 02/01/2022  Antimicrobials:  None   Subjective: Sitting up in chair.  Having some occasional coughing.  Denies any chest pain.  Denies any significant shortness of breath.  No abdominal pain.  States he just saw nephrologist who is going to dialyze him again today.  Wife at bedside.  Objective: Vitals:   02/02/22 1441 02/02/22 1924 02/02/22 2231 02/03/22 0631  BP: (!) 152/88 (!) 169/93 (!) 160/80 (!) 159/82  Pulse: 66  74 68  Resp: '16  16 16  ' Temp: 97.8 F (36.6 C)  97.6 F (36.4 C) 97.9 F (36.6 C)  TempSrc: Oral  Oral Oral  SpO2: 99%     Weight:    105.4 kg  Height:        Intake/Output Summary (Last 24 hours) at 02/03/2022 1043 Last data filed at 02/02/2022 1900 Gross per 24 hour  Intake 720 ml  Output 750 ml  Net -30 ml    Filed Weights   02/01/22 0459 02/02/22 0508 02/03/22 0631  Weight: 106.7 kg 105.9 kg 105.4 kg    Examination:  General exam: NAD. Respiratory system: Coarse breath sounds/crackles in the bases.  No wheezing, no rhonchi, fair air movement, speaking in full sentences.   Cardiovascular system: RRR with 3/6 SEM right upper sternal border and left upper sternal border.  No JVD.  Trace bilateral lower extremity edema.  Gastrointestinal system: Abdomen is soft, nontender, nondistended, positive bowel sounds.  No rebound.  No guarding.  Central nervous system: Alert and oriented. No focal neurological deficits. Extremities: Symmetric 5 x 5 power. Skin: No rashes, lesions or ulcers Psychiatry: Judgement and insight appear normal. Mood & affect appropriate.     Data Reviewed: I have personally reviewed following labs and imaging studies  CBC: Recent Labs  Lab 01/30/22 1241 01/31/22 0130 02/01/22 0325 02/01/22 1412  WBC 7.4 7.6 5.2 5.6  NEUTROABS 5.7  --   --   --   HGB 12.5* 12.0* 10.6* 11.7*   HCT 36.2* 35.6* 30.2* 33.1*  MCV 96.0 97.3 94.4 93.8  PLT 132* 135* 122* 123*     Basic Metabolic Panel: Recent Labs  Lab 01/31/22 0130 01/31/22 1248 02/01/22 0325 02/01/22 1412 02/02/22 0342 02/03/22 0220  NA 138 139 138 138 136 139  K 5.7* 6.1* 5.3* 5.1 3.7 4.6  CL 108 109 108 106 103 101  CO2 16* 16* 17* 18* 22 22  GLUCOSE 95 89 107* 98 87 87  BUN 117* 116* 118* 117* 80* 87*  CREATININE 11.44* 11.51* 12.20* 12.67* 9.29* 10.31*  CALCIUM 9.4 9.1 8.7* 9.0 8.5* 8.7*  MG 2.2  --  2.3  --   --   --   PHOS 7.8* 7.6* 8.6* 8.4*  --   --      GFR: Estimated Creatinine Clearance: 7.2 mL/min (A) (by C-G formula based on SCr of 10.31 mg/dL (H)).  Liver Function Tests: Recent Labs  Lab 01/30/22 1241 01/31/22 0130 01/31/22 1248 02/01/22 0325 02/01/22 1412  AST 14* 13*  --   --   --   ALT 24 24  --   --   --   ALKPHOS 46 43  --   --   --   BILITOT 0.9 1.3*  --   --   --  PROT 6.5 6.2*  --   --   --   ALBUMIN 3.8 3.6 3.4* 2.9* 3.4*     CBG: No results for input(s): "GLUCAP" in the last 168 hours.   No results found for this or any previous visit (from the past 240 hour(s)).       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 EXAM: NON-TUNNELED CENTRAL VENOUS HEMODIALYSIS CATHETER PLACEMENT WITH ULTRASOUND AND FLUOROSCOPIC GUIDANCE COMPARISON:  None Available. MEDICATIONS: None FLUOROSCOPY TIME:  0.2 minutes, 1 mGy COMPLICATIONS: None immediate. PROCEDURE: Informed written consent was obtained from the patient after a discussion of the risks, benefits, and alternatives to treatment. Questions regarding the procedure were encouraged and answered. The right neck and chest were prepped with chlorhexidine in a sterile fashion, and a sterile drape was applied covering the operative field. Maximum barrier sterile technique with sterile gowns  and gloves were used for the procedure. A timeout was performed prior to the initiation of the procedure. After the overlying soft tissues were anesthetized, a small venotomy incision was created and a micropuncture kit was utilized to access the internal jugular vein. Real-time ultrasound guidance was utilized for vascular access including the acquisition of a permanent ultrasound image documenting patency of the accessed vessel. The microwire was utilized to measure appropriate catheter length. A stiff glidewire was advanced to the level of the IVC. Under fluoroscopic guidance, the venotomy was serially dilated, ultimately allowing placement of a 20 cm temporary Trialysis catheter with tip ultimately terminating within the superior aspect of the right atrium. Final catheter positioning was confirmed and documented with a spot radiographic image. The catheter aspirates and flushes normally. The catheter was flushed with appropriate volume heparin dwells. The catheter exit site was secured with a 0-Prolene retention suture. A dressing was placed. The patient tolerated the procedure well without immediate post procedural complication. IMPRESSION: Successful placement of a right internal jugular approach 20 cm temporary dialysis catheter with tip terminating with in the superior aspect of the right atrium. The catheter is ready for immediate use. PLAN: This catheter may be converted to a tunneled dialysis catheter at a later date as indicated. Ruthann Cancer, MD [facility] Radiology Electronically Signed   By: Ruthann Cancer M.D.   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 EXAM: NON-TUNNELED CENTRAL VENOUS HEMODIALYSIS CATHETER PLACEMENT WITH ULTRASOUND AND FLUOROSCOPIC GUIDANCE COMPARISON:  None Available. MEDICATIONS: None  FLUOROSCOPY TIME:  0.2 minutes, 1 mGy COMPLICATIONS: None immediate. PROCEDURE: Informed written consent was obtained from the patient after a discussion of the risks, benefits, and alternatives to treatment. Questions regarding the procedure were encouraged and answered. The right neck and chest were prepped with chlorhexidine in a sterile fashion, and a sterile drape was applied covering the operative field. Maximum barrier sterile technique with sterile gowns and gloves were used for the procedure. A timeout was performed prior to the initiation of the procedure. After the overlying soft tissues were anesthetized, a small venotomy incision was created and a micropuncture kit was utilized to access the internal jugular vein. Real-time ultrasound guidance was utilized for vascular access including the acquisition of a permanent ultrasound image documenting patency of the accessed vessel. The microwire was utilized to measure appropriate catheter length. A stiff  glidewire was advanced to the level of the IVC. Under fluoroscopic guidance, the venotomy was serially dilated, ultimately allowing placement of a 20 cm temporary Trialysis catheter with tip ultimately terminating within the superior aspect of the right atrium. Final catheter positioning was confirmed and documented with a spot radiographic image. The catheter aspirates and flushes normally. The catheter was flushed with appropriate volume heparin dwells. The catheter exit site was secured with a 0-Prolene retention suture. A dressing was placed. The patient tolerated the procedure well without immediate post procedural complication. IMPRESSION: Successful placement of a right internal jugular approach 20 cm temporary dialysis catheter with tip terminating with in the superior aspect of the right atrium. The catheter is ready for immediate use. PLAN: This catheter may be converted to a tunneled dialysis catheter at a later date as indicated. Ruthann Cancer, MD  [facility] Radiology Electronically Signed   By: Ruthann Cancer M.D.   On: 02/01/2022 13:41        Scheduled Meds:  amLODipine  10 mg Oral QPM   apixaban  2.5 mg Oral BID   atorvastatin  20 mg Oral QPM   Chlorhexidine Gluconate Cloth  6 each Topical Q0600   cholecalciferol  1,000 Units Oral q AM   finasteride  5 mg Oral q AM   hydrALAZINE  25 mg Oral Daily   sodium bicarbonate  1,300 mg Oral BID   sodium zirconium cyclosilicate  10 g Oral Daily   Continuous Infusions:  anticoagulant sodium citrate        LOS: 4 days    Time spent: 35 minutes    Irine Seal, MD [facility]   To contact the attending provider between 7A-7P or the covering provider during after hours 7P-7A, please log into the web site [email or web address] and access using universal [place] password for that web site. If you do not have the password, please call the hospital operator.  02/03/2022, 10:43 AM

## 2022-02-03 NOTE — Progress Notes (Signed)
[facility] Progress Note   Assessment/ Plan:    AKI: bsaeline cr was 1.13 as of 11/2020.  Now 11 in the setting of recent addition of HCTZ (July 17th per records) and then CTA with contrast.               - no real symptoms for GN             - suspect 2 hits with the HCTZ and then the CTA             - send UA and UP/C--> bland with minimal proteinuria             - hold ARB and HCTZ             - s/p IVFs             - renal US- bladder scan in ED 45 mL so don't suspect retaining, no hydronephrosis  - s/p HD #1 8/17  - HD #2 today 8/19  - Lasix tomorrow   2.  Hyperkalemia- improved             - back off on lokelma to daily  3.  Metabolic acidosis:  - bicarbonate 1300 BID   4.  HTN:             - amlodipine ok    5.  Dispo: inpt  Subjective:    Seen in room.  Cr up a point.  Needs another HD session.  Orders written, d/w family and pt   Objective:   BP (!) 159/82 (BP Location: Left Arm)   Pulse 68   Temp 97.9 F (36.6 C) (Oral)   Resp 16   Ht 6' (1.829 m)   Wt 105.4 kg   SpO2 99%   BMI 31.51 kg/m   Intake/Output Summary (Last 24 hours) at 02/03/2022 1135 Last data filed at 02/03/2022 1100 Gross per 24 hour  Intake 720 ml  Output 1250 ml  Net -530 ml   Weight change: -0.515 kg  Physical Exam: GEN NAD, lying flat in bed HEENT EOMI PERRL NECK no JVD PULM clear CV RRR crisp S2 ABD soft, nondistended EXT trace LE edema NEURO AAO x 3 nonfocal SKIN no rashes MSK no effusions  Imaging: IR Fluoro Guide CV Line Right  Result Date: 02/01/2022 INDICATION: 81 year old male with 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 EXAM: NON-TUNNELED CENTRAL VENOUS HEMODIALYSIS CATHETER PLACEMENT WITH ULTRASOUND AND FLUOROSCOPIC GUIDANCE COMPARISON:  None Available. MEDICATIONS: None FLUOROSCOPY TIME:  0.2 minutes, 1 mGy COMPLICATIONS: None immediate. PROCEDURE: Informed written consent was obtained from the patient after a  discussion of the risks, benefits, and alternatives to treatment. Questions regarding the procedure were encouraged and answered. The right neck and chest were prepped with chlorhexidine in a sterile fashion, and a sterile drape was applied covering the operative field. Maximum barrier sterile technique with sterile gowns and gloves were used for the procedure. A timeout was performed prior to the initiation of the procedure. After the overlying soft tissues were anesthetized, a small venotomy incision was created and a micropuncture kit was utilized to access the internal jugular vein. Real-time ultrasound guidance was utilized for vascular access including the acquisition of a permanent ultrasound image documenting patency of the accessed vessel. The microwire was utilized to measure appropriate catheter length. A stiff glidewire was advanced to the level of the IVC. Under fluoroscopic guidance, the venotomy was serially dilated, ultimately allowing placement  of a 20 cm temporary Trialysis catheter with tip ultimately terminating within the superior aspect of the right atrium. Final catheter positioning was confirmed and documented with a spot radiographic image. The catheter aspirates and flushes normally. The catheter was flushed with appropriate volume heparin dwells. The catheter exit site was secured with a 0-Prolene retention suture. A dressing was placed. The patient tolerated the procedure well without immediate post procedural complication. IMPRESSION: Successful placement of a right internal jugular approach 20 cm temporary dialysis catheter with tip terminating with in the superior aspect of the right atrium. The catheter is ready for immediate use. PLAN: This catheter may be converted to a tunneled dialysis catheter at a later date as indicated. Ruthann Cancer, MD [facility] Radiology Electronically Signed   By: Ruthann Cancer M.D.   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 EXAM: NON-TUNNELED CENTRAL VENOUS HEMODIALYSIS CATHETER PLACEMENT WITH ULTRASOUND AND FLUOROSCOPIC GUIDANCE COMPARISON:  None Available. MEDICATIONS: None FLUOROSCOPY TIME:  0.2 minutes, 1 mGy COMPLICATIONS: None immediate. PROCEDURE: Informed written consent was obtained from the patient after a discussion of the risks, benefits, and alternatives to treatment. Questions regarding the procedure were encouraged and answered. The right neck and chest were prepped with chlorhexidine in a sterile fashion, and a sterile drape was applied covering the operative field. Maximum barrier sterile technique with sterile gowns and gloves were used for the procedure. A timeout was performed prior to the initiation of the procedure. After the overlying soft tissues were anesthetized, a small venotomy incision was created and a micropuncture kit was utilized to access the internal jugular vein. Real-time ultrasound guidance was utilized for vascular access including the acquisition of a permanent ultrasound image documenting patency of the accessed vessel. The microwire was utilized to measure appropriate catheter length. A stiff glidewire was advanced to the level of the IVC. Under fluoroscopic guidance, the venotomy was serially dilated, ultimately allowing placement of a 20 cm temporary Trialysis catheter with tip ultimately terminating within the superior aspect of the right atrium. Final catheter positioning was confirmed and documented with a spot radiographic image. The catheter aspirates and flushes normally. The catheter was flushed with appropriate volume heparin dwells. The catheter exit site was secured with a 0-Prolene retention suture. A dressing was placed. The patient tolerated the procedure well without immediate post procedural complication.  IMPRESSION: Successful placement of a right internal jugular approach 20 cm temporary dialysis catheter with tip terminating with in the superior aspect of the right atrium. The catheter is ready for immediate use. PLAN: This catheter may be converted to a tunneled dialysis catheter at a later date as indicated. Ruthann Cancer, MD [facility] Radiology Electronically Signed   By: Ruthann Cancer M.D.   On: 02/01/2022 13:41    Labs: BMET Recent Labs  Lab 01/30/22 1751 01/31/22 0130 01/31/22 1248 02/01/22 0325 02/01/22 1412 02/02/22 0342 02/03/22 0220  NA 139 138 139 138 138 136 139  K 6.4* 5.7* 6.1* 5.3* 5.1 3.7 4.6  CL 109 108 109 108 106 103 101  CO2 17* 16* 16* 17* 18* 22 22  GLUCOSE 84 95 89 107* 98 87 87  BUN 121* 117* 116* 118* 117* 80* 87*  CREATININE 11.47* 11.44* 11.51* 12.20* 12.67* 9.29* 10.31*  CALCIUM 9.6 9.4 9.1 8.7* 9.0 8.5* 8.7*  PHOS  --  7.8* 7.6* 8.6* 8.4*  --   --    CBC Recent Labs  Lab 01/30/22 1241 01/31/22 0130 02/01/22 0325 02/01/22 1412  WBC 7.4 7.6 5.2 5.6  NEUTROABS 5.7  --   --   --   HGB 12.5* 12.0* 10.6* 11.7*  HCT 36.2* 35.6* 30.2* 33.1*  MCV 96.0 97.3 94.4 93.8  PLT 132* 135* 122* 123*    Medications:     amLODipine  10 mg Oral QPM   apixaban  2.5 mg Oral BID   atorvastatin  20 mg Oral QPM   Chlorhexidine Gluconate Cloth  6 each Topical Q0600   cholecalciferol  1,000 Units Oral q AM   finasteride  5 mg Oral q AM   hydrALAZINE  25 mg Oral Daily   sodium bicarbonate  1,300 mg Oral BID   sodium zirconium cyclosilicate  10 g Oral Daily    Madelon Lips, MD 02/03/2022, 11:35 AM

## 2022-02-04 ENCOUNTER — Inpatient Hospital Stay (HOSPITAL_COMMUNITY): Payer: Medicare Other

## 2022-02-04 DIAGNOSIS — N179 Acute kidney failure, unspecified: Secondary | ICD-10-CM | POA: Diagnosis not present

## 2022-02-04 DIAGNOSIS — I480 Paroxysmal atrial fibrillation: Secondary | ICD-10-CM | POA: Diagnosis present

## 2022-02-04 DIAGNOSIS — N400 Enlarged prostate without lower urinary tract symptoms: Secondary | ICD-10-CM | POA: Diagnosis not present

## 2022-02-04 DIAGNOSIS — E876 Hypokalemia: Secondary | ICD-10-CM | POA: Diagnosis present

## 2022-02-04 DIAGNOSIS — E8721 Acute metabolic acidosis: Secondary | ICD-10-CM | POA: Diagnosis not present

## 2022-02-04 DIAGNOSIS — E7800 Pure hypercholesterolemia, unspecified: Secondary | ICD-10-CM | POA: Diagnosis not present

## 2022-02-04 LAB — BASIC METABOLIC PANEL
Anion gap: 12 (ref 5–15)
BUN: 29 mg/dL — ABNORMAL HIGH (ref 8–23)
CO2: 23 mmol/L (ref 22–32)
Calcium: 9.3 mg/dL (ref 8.9–10.3)
Chloride: 103 mmol/L (ref 98–111)
Creatinine, Ser: 2.47 mg/dL — ABNORMAL HIGH (ref 0.61–1.24)
GFR, Estimated: 26 mL/min — ABNORMAL LOW (ref >60)
Glucose, Bld: 106 mg/dL — ABNORMAL HIGH (ref 70–99)
Potassium: 3.3 mmol/L — ABNORMAL LOW (ref 3.5–5.1)
Sodium: 138 mmol/L (ref 135–145)

## 2022-02-04 LAB — URINALYSIS, ROUTINE W REFLEX MICROSCOPIC
Bilirubin Urine: NEGATIVE
Bilirubin Urine: NEGATIVE
Glucose, UA: NEGATIVE mg/dL
Glucose, UA: NEGATIVE mg/dL
Ketones, ur: NEGATIVE mg/dL
Ketones, ur: 15 mg/dL — AB
Nitrite: NEGATIVE
Nitrite: NEGATIVE
Protein, ur: NEGATIVE mg/dL
Protein, ur: NEGATIVE mg/dL
Specific Gravity, Urine: <1.005 — ABNORMAL LOW (ref 1.005–1.030)
Specific Gravity, Urine: 1.01 (ref 1.005–1.030)
pH: 7 (ref 5.0–8.0)
pH: 7.5 (ref 5.0–8.0)

## 2022-02-04 LAB — CBC
HCT: 35.5 % — ABNORMAL LOW (ref 39.0–52.0)
Hemoglobin: 12.5 g/dL — ABNORMAL LOW (ref 13.0–17.0)
MCH: 33 pg (ref 26.0–34.0)
MCHC: 35.2 g/dL (ref 30.0–36.0)
MCV: 93.7 fL (ref 80.0–100.0)
Platelets: 138 10*3/uL — ABNORMAL LOW (ref 150–400)
RBC: 3.79 MIL/uL — ABNORMAL LOW (ref 4.22–5.81)
RDW: 13.8 % (ref 11.5–15.5)
WBC: 9.2 10*3/uL (ref 4.0–10.5)
nRBC: 0 % (ref 0.0–0.2)

## 2022-02-04 LAB — URINALYSIS, MICROSCOPIC (REFLEX)
RBC / HPF: >50 RBC/hpf (ref 0–5)
RBC / HPF: >50 RBC/hpf (ref 0–5)
Squamous Epithelial / HPF: NONE SEEN (ref 0–5)

## 2022-02-04 MED ORDER — HYDRALAZINE HCL 25 MG PO TABS
25.0000 mg | ORAL_TABLET | Freq: Three times a day (TID) | ORAL | Status: DC
  Administered 2022-02-04 – 2022-02-05 (×2): 25 mg via ORAL
  Filled 2022-02-04 (×2): qty 1

## 2022-02-04 MED ORDER — GUAIFENESIN ER 600 MG PO TB12
1200.0000 mg | ORAL_TABLET | Freq: Two times a day (BID) | ORAL | Status: DC
  Administered 2022-02-04 – 2022-02-06 (×5): 1200 mg via ORAL
  Filled 2022-02-04 (×5): qty 2

## 2022-02-04 MED ORDER — POTASSIUM CHLORIDE CRYS ER 20 MEQ PO TBCR
20.0000 meq | EXTENDED_RELEASE_TABLET | Freq: Once | ORAL | Status: AC
  Administered 2022-02-04: 20 meq via ORAL
  Filled 2022-02-04: qty 1

## 2022-02-04 MED ORDER — HYDRALAZINE HCL 25 MG PO TABS
25.0000 mg | ORAL_TABLET | Freq: Two times a day (BID) | ORAL | Status: DC
  Administered 2022-02-04: 25 mg via ORAL
  Filled 2022-02-04: qty 1

## 2022-02-04 MED ORDER — OXYCODONE-ACETAMINOPHEN 5-325 MG PO TABS
1.0000 | ORAL_TABLET | ORAL | Status: DC | PRN
  Administered 2022-02-04 – 2022-02-05 (×3): 2 via ORAL
  Filled 2022-02-04 (×4): qty 2

## 2022-02-04 NOTE — Progress Notes (Signed)
  X-cover Note: Received secure chat from bedside RN. Pt has noted his urine becoming more blood tinged this evening. UA shows +HgB with >50 RBC. 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 May need cardiology to weigh in pt's needs for long term systemic anticoagulation.   Carollee Herter, DO [facility]

## 2022-02-04 NOTE — Progress Notes (Signed)
[facility] Progress Note   Assessment/ Plan:    AKI: bsaeline cr was 1.13 as of 11/2020.  Now 11 in the setting of recent addition of HCTZ (July 17th per records) and then CTA with contrast.               - no real symptoms for GN             - suspect 2 hits with the HCTZ and then the CTA             - send UA and UP/C--> bland with minimal proteinuria             - hold ARB and HCTZ             - s/p IVFs             - renal US- bladder scan in ED 45 mL so don't suspect retaining, no hydronephrosis  - s/p HD #1 8/17  - HD #2 8/19  - great improvement in UOP and Cr much improved- suspect recovery- if still going the right way tomorrow anticipate d/c of nontunneled HD catheter  - having gross hematuria, on Eliquis for valve- hold AM dose, suspect there may have been some accumulation of drug d/t AKI, likely can restart it tomorrow (can d/w pharmacy before restart)  - likely will need OP urology eval   2.  Hyperkalemia- improved             - off lokelma  3.  Metabolic acidosis:  - off bicarb   4.  HTN:             - amlodipine ok    5.  Dispo: inpt  Subjective:    UOP greatly improving.  HD yesterday but Cr went from 10-->2--> think renal recovery occurring.  + gross hematuria overnight.     Objective:   BP (!) 152/95   Pulse 88   Temp 98.3 F (36.8 C) (Oral)   Resp 16   Ht 6' (1.829 m)   Wt 106 kg   SpO2 99%   BMI 31.70 kg/m   Intake/Output Summary (Last 24 hours) at 02/04/2022 1040 Last data filed at 02/04/2022 0900 Gross per 24 hour  Intake 598 ml  Output 1375 ml  Net -777 ml   Weight change: 0.606 kg  Physical Exam: GEN NAD, lying flat in bed HEENT EOMI PERRL NECK no JVD PULM clear CV RRR crisp S2 ABD soft, nondistended EXT trace LE edema NEURO AAO x 3 nonfocal SKIN no rashes MSK no effusions  Imaging: No results found.  Labs: BMET Recent Labs  Lab 01/31/22 0130 01/31/22 1248 02/01/22 0325 02/01/22 1412 02/02/22 0342  02/03/22 0220 02/04/22 0635  NA 138 139 138 138 136 139 138  K 5.7* 6.1* 5.3* 5.1 3.7 4.6 3.3*  CL 108 109 108 106 103 101 103  CO2 16* 16* 17* 18* 22 22 23   GLUCOSE 95 89 107* 98 87 87 106*  BUN 117* 116* 118* 117* 80* 87* 29*  CREATININE 11.44* 11.51* 12.20* 12.67* 9.29* 10.31* 2.47*  CALCIUM 9.4 9.1 8.7* 9.0 8.5* 8.7* 9.3  PHOS 7.8* 7.6* 8.6* 8.4*  --   --   --    CBC Recent Labs  Lab 01/30/22 1241 01/31/22 0130 02/01/22 0325 02/01/22 1412 02/04/22 0635  WBC 7.4 7.6 5.2 5.6 9.2  NEUTROABS 5.7  --   --   --   --   HGB  12.5* 12.0* 10.6* 11.7* 12.5*  HCT 36.2* 35.6* 30.2* 33.1* 35.5*  MCV 96.0 97.3 94.4 93.8 93.7  PLT 132* 135* 122* 123* 138*    Medications:     amLODipine  10 mg Oral QPM   atorvastatin  20 mg Oral QPM   Chlorhexidine Gluconate Cloth  6 each Topical Q0600   cholecalciferol  1,000 Units Oral q AM   finasteride  5 mg Oral q AM   hydrALAZINE  25 mg Oral BID    Bufford Buttner, MD 02/04/2022, 10:40 AM

## 2022-02-04 NOTE — Progress Notes (Signed)
New symptoms of increased urine w/ possible blood tinged color noted overnight. Pt on eliquis. Carollee Herter, DO notified via secure chat for UA order. Chen updated of Hgb + and bacteria + results. Per Imogene Burn, have [facility] RN follow up with nephrology in AM.

## 2022-02-04 NOTE — Consult Note (Signed)
Urology Consult  Referring physician: Dr. Janee Morn Reason for referral: left ureteral calculus   Chief Complaint: Gross hematuria  History of Present Illness: Mr Derek Washington is a 81yo with a history of nephrolithiasis who was admitted on 8/15 with acute renal failure. Renal US 8/15 showed no hydronephrosis. His creatinine on admission was 11 and he required dialysis. His creatinine is currently 2.5. This morning he developed mild left flank pain and an episode of gross hematuria. He underwent CT stone study today which showed an 71mm left distal ureteral calculus with mild left hydronephrosis. He also has multiple left renal calculi. He is urinating without difficulty currently. 1.4L urine output yesterday. UA today shows RBCs and rare bacteria. No fevers. No other complaints. Currently his left flank pain has resolved  Past Medical History:  Diagnosis Date   Bicuspid aortic valve    LAST ECHOCARDIOGRAM APPROXIMATELY 2016? MILD AORTIC REGURG   BPH (benign prostatic hyperplasia)    Hard of hearing    Heart murmur    High cholesterol    Hydrocele    Hypertension    Insomnia    Low back pain    RIGHT   Mild obesity    Nocturia    Right elbow tendonitis    Tinnitus    Past Surgical History:  Procedure Laterality Date   AORTIC VALVE REPLACEMENT N/A 12/01/2020   Procedure: AORTIC VALVE REPLACEMENT (AVR) USING INSPIRIS AORTIC VALVE;  Surgeon: [name], MD;  Location: [facility] OR;  Service: Open Heart Surgery;  Laterality: N/A;   HYDROCELE EXCISION  2017   DR. [name] 2017   IR FLUORO GUIDE CV LINE RIGHT  02/01/2022   IR US GUIDE VASC ACCESS RIGHT  02/01/2022   JOINT REPLACEMENT     left total knee replacement      2007    PACEMAKER IMPLANT N/A 12/07/2020   Procedure: PACEMAKER IMPLANT;  Surgeon: [name], MD;  Location: [facility] INVASIVE CV LAB;  Service: Cardiovascular;  Laterality: N/A;   right knee total replacement      2011   RIGHT/LEFT HEART CATH AND CORONARY ANGIOGRAPHY  N/A 11/03/2020   Procedure: RIGHT/LEFT HEART CATH AND CORONARY ANGIOGRAPHY;  Surgeon: [name], MD;  Location: [facility] INVASIVE CV LAB;  Service: Cardiovascular;  Laterality: N/A;   SCROTAL EXPLORATION N/A 12/26/2015   Procedure: SCROTUM EXPLORATION;  Surgeon: [name], MD;  Location: [facility] ORS;  Service: Urology;  Laterality: N/A;  EXCISION OF LEFT EPIDIDYMAL CYST/SPERMATOCELE   TEE WITHOUT CARDIOVERSION N/A 12/01/2020   Procedure: TRANSESOPHAGEAL ECHOCARDIOGRAM (TEE);  Surgeon: [name], MD;  Location[facility] OR;  Service: Open Heart Surgery;  Laterality: N/A;   TONSILLECTOMY     VASECTOMY     1968    Medications: I have reviewed the patient's current medications. Allergies:  Allergies  Allergen Reactions   Hydrochlorothiazide Other (See Comments)    fatigue    Family History  Problem Relation Age of Onset   Other Mother 63       HEAVY SMOKER   COPD Father 37       HEAVY SMOKER   Other Sister 32       HEAVY SMOKER   Social History:  reports that he has never smoked. He has never used smokeless tobacco. He reports current alcohol use. He reports that he does not use drugs.  Review of Systems  Genitourinary:  Positive for flank pain and hematuria.  All other systems reviewed and are negative.   Physical  Exam:  Vital signs in last 24 hours: Temp:  [97.7 F (36.5 C)-98.3 F (36.8 C)] 97.7 F (36.5 C) (08/20 2151) Pulse Rate:  [77-88] 77 (08/20 1321) Resp:  [16-18] 18 (08/20 2151) BP: (152-186)/(86-96) 165/86 (08/20 2148) SpO2:  [99 %] 99 % (08/20 1321) Weight:  [106 kg] 106 kg (08/20 4920) Physical Exam Vitals reviewed.  Constitutional:      Appearance: Normal appearance.  HENT:     Head: Normocephalic and atraumatic.     Mouth/Throat:     Mouth: Mucous membranes are dry.  Eyes:     Extraocular Movements: Extraocular movements intact.     Pupils: Pupils are equal, round, and reactive to light.  Cardiovascular:     Rate and Rhythm: Normal rate and  regular rhythm.  Pulmonary:     Effort: Pulmonary effort is normal. No respiratory distress.  Abdominal:     General: Abdomen is flat. There is no distension.  Musculoskeletal:        General: No swelling. Normal range of motion.     Cervical back: Normal range of motion and neck supple.  Skin:    General: Skin is warm and dry.  Neurological:     General: No focal deficit present.     Mental Status: He is alert and oriented to person, place, and time.  Psychiatric:        Mood and Affect: Mood normal.        Behavior: Behavior normal.     Laboratory Data:  Results for orders placed or performed during the hospital encounter of 01/30/22 (from the past 72 hour(s))  Basic metabolic panel     Status: Abnormal   Collection Time: 02/02/22  3:42 AM  Result Value Ref Range   Sodium 136 135 - 145 mmol/L   Potassium 3.7 3.5 - 5.1 mmol/L    Comment: DELTA CHECK NOTED   Chloride 103 98 - 111 mmol/L   CO2 22 22 - 32 mmol/L   Glucose, Bld 87 70 - 99 mg/dL    Comment: Glucose reference range applies only to samples taken after fasting for at least 8 hours.   BUN 80 (H) 8 - 23 mg/dL   Creatinine, Ser 1.00 (H) 0.61 - 1.24 mg/dL   Calcium 8.5 (L) 8.9 - 10.3 mg/dL   GFR, Estimated 5 (L) >60 mL/min    Comment: (NOTE) Calculated using the CKD-EPI Creatinine Equation (2021)    Anion gap 11 5 - 15    Comment: Performed at [facility], [address] [place], [place] [address]  Basic metabolic panel     Status: Abnormal   Collection Time: 02/03/22  2:20 AM  Result Value Ref Range   Sodium 139 135 - 145 mmol/L   Potassium 4.6 3.5 - 5.1 mmol/L    Comment: DELTA CHECK NOTED   Chloride 101 98 - 111 mmol/L   CO2 22 22 - 32 mmol/L   Glucose, Bld 87 70 - 99 mg/dL    Comment: Glucose reference range applies only to samples taken after fasting for at least 8 hours.   BUN 87 (H) 8 - 23 mg/dL   Creatinine, Ser 75.88 (H) 0.61 - 1.24 mg/dL   Calcium 8.7 (L) 8.9 - 10.3 mg/dL   GFR, Estimated 5  (L) >60 mL/min    Comment: (NOTE) Calculated using the CKD-EPI Creatinine Equation (2021)    Anion gap 16 (H) 5 - 15    Comment: Performed at [facility] Lab,  [address], [place], [place] [address]  Urinalysis, Routine w reflex microscopic Urine, Clean Catch     Status: Abnormal   Collection Time: 02/04/22 12:50 AM  Result Value Ref Range   Color, Urine YELLOW YELLOW   APPearance CLOUDY (A) CLEAR   Specific Gravity, Urine <1.005 (L) 1.005 - 1.030   pH 7.0 5.0 - 8.0   Glucose, UA NEGATIVE NEGATIVE mg/dL   Hgb urine dipstick LARGE (A) NEGATIVE   Bilirubin Urine NEGATIVE NEGATIVE   Ketones, ur NEGATIVE NEGATIVE mg/dL   Protein, ur NEGATIVE NEGATIVE mg/dL   Nitrite NEGATIVE NEGATIVE   Leukocytes,Ua TRACE (A) NEGATIVE    Comment: Performed at [facility] Lab, [address] [place], [place] [address]  Urinalysis, Microscopic (reflex)     Status: Abnormal   Collection Time: 02/04/22 12:50 AM  Result Value Ref Range   RBC / HPF >50 0 - 5 RBC/hpf   WBC, UA 11-20 0 - 5 WBC/hpf   Bacteria, UA RARE (A) NONE SEEN   Squamous Epithelial / LPF 0-5 0 - 5    Comment: Performed at [facility], [address], [place], [place] [address]  Basic metabolic panel     Status: Abnormal   Collection Time: 02/04/22  6:35 AM  Result Value Ref Range   Sodium 138 135 - 145 mmol/L   Potassium 3.3 (L) 3.5 - 5.1 mmol/L   Chloride 103 98 - 111 mmol/L   CO2 23 22 - 32 mmol/L   Glucose, Bld 106 (H) 70 - 99 mg/dL    Comment: Glucose reference range applies only to samples taken after fasting for at least 8 hours.   BUN 29 (H) 8 - 23 mg/dL   Creatinine, Ser 3.53 (H) 0.61 - 1.24 mg/dL    Comment: DIALYSIS   Calcium 9.3 8.9 - 10.3 mg/dL   GFR, Estimated 26 (L) >60 mL/min    Comment: (NOTE) Calculated using the CKD-EPI Creatinine Equation (2021)    Anion gap 12 5 - 15    Comment: Performed at [facility] Lab, [address], [place], [place] [address]  CBC     Status: Abnormal    Collection Time: 02/04/22  6:35 AM  Result Value Ref Range   WBC 9.2 4.0 - 10.5 K/uL   RBC 3.79 (L) 4.22 - 5.81 MIL/uL   Hemoglobin 12.5 (L) 13.0 - 17.0 g/dL   HCT 15.4 (L) 00.8 - 67.6 %   MCV 93.7 80.0 - 100.0 fL   MCH 33.0 26.0 - 34.0 pg   MCHC 35.2 30.0 - 36.0 g/dL   RDW 19.5 09.3 - 26.7 %   Platelets 138 (L) 150 - 400 K/uL    Comment: SPECIMEN CHECKED FOR CLOTS REPEATED TO VERIFY    nRBC 0.0 0.0 - 0.2 %    Comment: Performed at [facility] Lab, [address] [place], [place] [address]  Urinalysis, Routine w reflex microscopic Urine, Clean Catch     Status: Abnormal   Collection Time: 02/04/22  8:49 AM  Result Value Ref Range   Color, Urine YELLOW YELLOW   APPearance HAZY (A) CLEAR   Specific Gravity, Urine 1.010 1.005 - 1.030   pH 7.5 5.0 - 8.0   Glucose, UA NEGATIVE NEGATIVE mg/dL   Hgb urine dipstick LARGE (A) NEGATIVE   Bilirubin Urine NEGATIVE NEGATIVE   Ketones, ur 15 (A) NEGATIVE mg/dL   Protein, ur NEGATIVE NEGATIVE mg/dL   Nitrite NEGATIVE NEGATIVE   Leukocytes,Ua  TRACE (A) NEGATIVE    Comment: Performed at [facility] Lab, [address], [place], [place] [address]  Urinalysis, Microscopic (reflex)     Status: Abnormal   Collection Time: 02/04/22  8:49 AM  Result Value Ref Range   RBC / HPF >50 0 - 5 RBC/hpf   WBC, UA 0-5 0 - 5 WBC/hpf   Bacteria, UA RARE (A) NONE SEEN   Squamous Epithelial / LPF NONE SEEN 0 - 5    Comment: Performed at [facility], [address] [place], [place] [address]   No results found for this or any previous visit (from the past 240 hour(s)). Creatinine: Recent Labs    01/31/22 0130 01/31/22 1248 02/01/22 0325 02/01/22 1412 02/02/22 0342 02/03/22 0220 02/04/22 0635  CREATININE 11.44* 11.51* 12.20* 12.67* 9.29* 10.31* 2.47*   Baseline Creatinine: 1  Impression/Assessment:  81yo with left ureteral calculus  Plan:  -We discussed the management of kidney stones. These options include observation,  ureteroscopy, shockwave lithotripsy (ESWL) and percutaneous nephrolithotomy (PCNL). We discussed which options are relevant to the patient's stone(s). We discussed the natural history of kidney stones as well as the complications of untreated stones and the impact on quality of life without treatment as well as with each of the above listed treatments. We also discussed the efficacy of each treatment in its ability to clear the stone burden. With any of these management options I discussed the signs and symptoms of infection and the need for emergent treatment should these be experienced. For each option we discussed the ability of each procedure to clear the patient of their stone burden.   For observation I described the risks which include but are not limited to silent renal damage, life-threatening infection, need for emergent surgery, failure to pass stone and pain.   For ureteroscopy I described the risks which include bleeding, infection, damage to contiguous structures, positioning injury, ureteral stricture, ureteral avulsion, ureteral injury, need for prolonged ureteral stent, inability to perform ureteroscopy, need for an interval procedure, inability to clear stone burden, stent discomfort/pain, heart attack, stroke, pulmonary embolus and the inherent risks with general anesthesia.   For shockwave lithotripsy I described the risks which include arrhythmia, kidney contusion, kidney hemorrhage, need for transfusion, pain, inability to adequately break up stone, inability to pass stone fragments, Steinstrasse, infection associated with obstructing stones, need for alternate surgical procedure, need for repeat shockwave lithotripsy, MI, CVA, PE and the inherent risks with anesthesia/conscious sedation.   For PCNL I described the risks including positioning injury, pneumothorax, hydrothorax, need for chest tube, inability to clear stone burden, renal laceration, arterial venous fistula or  malformation, need for embolization of kidney, loss of kidney or renal function, need for repeat procedure, need for prolonged nephrostomy tube, ureteral avulsion, MI, CVA, PE and the inherent risks of general anesthesia.   - The patient would like to proceed with left ureteral stent placement, possible left ureteroscopic stone extraction.  We will make him NPO after midnight for planned surgery tomororw  Wilkie Aye 02/04/2022, 10:30 PM

## 2022-02-04 NOTE — Progress Notes (Addendum)
PROGRESS NOTE    Derek Washington  HEN:[id number] DOB: [birth year] DOA: 01/30/2022 PCP: [name], MD    Chief Complaint  Patient presents with   Abnormal Lab    Brief Narrative:  Patient is a pleasant 81 year old gentleman history of bicuspid aortic valve status post AVR, status post PPM, hypertension, hyperlipidemia, BPH presented to the ED with abnormal labs and fatigue noted to be in acute renal failure.  Patient noted to have been seen by PCP in July and noted to be on ARB and Norvasc with HCTZ added for better blood pressure control.  Patient noted to have also undergone CT angiogram on 01/22/2022 for follow-up on AVR and aortic valve dilatation with contrast noted.  Patient presented with a week of fatigue, generalized weakness.  Noted to have significant acute kidney injury and sent to the ED.  Patient noted on admission to have a creatinine of 11.61, potassium of 6.1.  Patient admitted for further evaluation and management.  Nephrology consulted.   Assessment & Plan:   Principal Problem:   AKI (acute kidney injury) (HCC) Active Problems:   Gastroesophageal reflux disease   High cholesterol   S/P AVR (aortic valve replacement)   Pacemaker - MDT   BPH (benign prostatic hyperplasia)   Hypertension   Hyperkalemia   Acute metabolic acidosis   Paroxysmal A-fib (HCC)  1 acute kidney injury (baseline creatinine approximately 1.13 11/2020) -Patient noted on admission with creatinine of 11.39.  Potassium of 6.4. -Renal function was initially trending up to 12.67, underwent intermittent hemodialysis #1, 02/01/2022 with creatinine dropping down to 9.29 and back up to 10.31 (02/03/2022) -Creatinine today at 2.47 after HD #2 02/03/2022. -Likely secondary to ATN/prerenal azotemia in the setting of HCTZ and recent contrast from CT angiogram. -Patient states making urine, urine output of 1.375 L recorded over the past 24 hours.  -Renal ultrasound negative for hydronephrosis, echogenic kidneys  bilaterally with right renal cortical thinning compatible with medical renal disease, bilateral renal cysts, 1.4 cm calculus in the bladder, prostatomegaly. -Urinalysis nitrite negative, leukocytes negative, negative for ketones, negative for protein, negative for glucose. -Urine sodium of 96, urine creatinine of 60. -Status post IV fluids with no significant improvement with renal function.   -Patient being followed by nephrology who are recommended intermittent dialysis with HD #1 on 02/01/2022 with improvement with renal function initially and trending back up 10.31 (02/03/2022). -Patient status post HD #2 02/03/2022 with creatinine down to 2.47. -Continue to hold ARB, HCTZ and likely will not resume on discharge. -Per nephrology.  2.  Hyperkalemia/hypokalemia -Likely secondary to problem #1. -Patient underwent hemodialysis #1 on 02/01/2022. -Repeat potassium this morning at 3.3.  4.6 from 3.7 from 5.1 from 5.3 from 6.1 from 5.7 from 6.4.   -Discontinue Lokelma.   -KDur 20 mg p.o. x1.   -Follow.  3.  Metabolic acidosis -Likely secondary to problem #1. -Improved on bicarb tablets.  -Follow.  4.  Hypertension -Continue current dose of Norvasc.   -Continue to hold HCTZ, ARB secondary to acute renal failure and likely will not resume on discharge. -Increase hydralazine to 25 mg twice daily for better blood pressure control.  5.  History of BPH -Continue finasteride. -Renal ultrasound negative for hydronephrosis. -Follow-up.  6.  Hyperlipidemia -Continue statin.  7.  History of aortic valve replacement -Stable. -Outpatient follow-up.  8.  Paroxysmal atrial fibrillation -Currently rate controlled. -Hold Eliquis today secondary to hematuria.  9.  Obesity -As evidenced by BMI of 31.91 kg/m2 -Lifestyle modification -Outpatient follow-up with  PCP.  9.  Hematuria/ -Patient noted with hematuria overnight. -Patient states some improvement with hematuria. -Urinalysis with large  hemoglobin, trace leukocytes, negative nitrite, rare bacteria, RBC > 50, WBC 0-5. -Patient states history of nephrolithiasis. -Renal ultrasound done early on during the hospitalization negative for hydronephrosis, echogenic kidneys bilaterally with right renal cortical thinning compatible with medical renal disease, bilateral renal cysts.  1.4 cm calculus in the bladder.  Prostamegaly. -Check urine cultures. -Hold Eliquis today. -CT renal stone protocol.  Addendum #10: Mild left-sided hydroureteronephrosis secondary to 8 mm distal left ureteric stone -Noted on CT renal stone protocol. -Eliquis on hold. -Consult with urology for further evaluation and management.  DVT prophylaxis: Eliquis Code Status: DNR Family Communication: Updated patient and wife at bedside. Disposition: Home when clinically improved, resolution of acute renal failure, when cleared by nephrology.  Status is: Inpatient Remains inpatient appropriate because: Severity of illness   Consultants:  Nephrology: Dr. Signe Colt 01/30/2022 IR  Procedures:  Chest x-ray 01/30/2022 Renal ultrasound 01/30/2022 Temporary hemodialysis catheter placement per IR, Dr. Elby Showers 02/01/2022 Renal ultrasound 01/30/2022. CT renal stone protocol 02/04/2022  Antimicrobials:  None   Subjective: Sitting up in chair.  States has a productive cough of greenish sputum.  Denies any significant shortness of breath.  No chest pain.  No abdominal pain.  No dysuria.  Stated had some blood in his urine last night but seems to be clearing up this morning.  States did have a prior history of kidney stones.   Objective: Vitals:   02/03/22 1932 02/03/22 2129 02/04/22 0620 02/04/22 0848  BP: (!) 186/96 (!) 159/84 (!) 165/96 (!) 152/95  Pulse:   88   Resp:   16   Temp:   98.3 F (36.8 C)   TempSrc:   Oral   SpO2:   99%   Weight:   106 kg   Height:        Intake/Output Summary (Last 24 hours) at 02/04/2022 1039 Last data filed at 02/04/2022  0900 Gross per 24 hour  Intake 598 ml  Output 1375 ml  Net -777 ml    Filed Weights   02/02/22 0508 02/03/22 0631 02/04/22 0620  Weight: 105.9 kg 105.4 kg 106 kg    Examination:  General exam: NAD. Respiratory system: Lungs clear to auscultation bilaterally.  No wheezes, no crackles, no rhonchi.  Fair air movement.   Cardiovascular system: RRR with 3/6 SEM RUSB, LUSB.  No JVD.  Trace bilateral lower extremity edema.  Gastrointestinal system: Abdomen is soft, nontender, nondistended, positive bowel sounds.  No rebound.  No guarding.    Central nervous system: Alert and oriented. No focal neurological deficits. Extremities: Symmetric 5 x 5 power. Skin: No rashes, lesions or ulcers Psychiatry: Judgement and insight appear normal. Mood & affect appropriate.     Data Reviewed: I have personally reviewed following labs and imaging studies  CBC: Recent Labs  Lab 01/30/22 1241 01/31/22 0130 02/01/22 0325 02/01/22 1412 02/04/22 0635  WBC 7.4 7.6 5.2 5.6 9.2  NEUTROABS 5.7  --   --   --   --   HGB 12.5* 12.0* 10.6* 11.7* 12.5*  HCT 36.2* 35.6* 30.2* 33.1* 35.5*  MCV 96.0 97.3 94.4 93.8 93.7  PLT 132* 135* 122* 123* 138*     Basic Metabolic Panel: Recent Labs  Lab 01/31/22 0130 01/31/22 1248 02/01/22 0325 02/01/22 1412 02/02/22 0342 02/03/22 0220 02/04/22 0635  NA 138 139 138 138 136 139 138  K 5.7* 6.1* 5.3* 5.1 3.7 4.6  3.3*  CL 108 109 108 106 103 101 103  CO2 16* 16* 17* 18* 22 22 23   GLUCOSE 95 89 107* 98 87 87 106*  BUN 117* 116* 118* 117* 80* 87* 29*  CREATININE 11.44* 11.51* 12.20* 12.67* 9.29* 10.31* 2.47*  CALCIUM 9.4 9.1 8.7* 9.0 8.5* 8.7* 9.3  MG 2.2  --  2.3  --   --   --   --   PHOS 7.8* 7.6* 8.6* 8.4*  --   --   --      GFR: Estimated Creatinine Clearance: 30 mL/min (A) (by C-G formula based on SCr of 2.47 mg/dL (H)).  Liver Function Tests: Recent Labs  Lab 01/30/22 1241 01/31/22 0130 01/31/22 1248 02/01/22 0325 02/01/22 1412  AST 14*  13*  --   --   --   ALT 24 24  --   --   --   ALKPHOS 46 43  --   --   --   BILITOT 0.9 1.3*  --   --   --   PROT 6.5 6.2*  --   --   --   ALBUMIN 3.8 3.6 3.4* 2.9* 3.4*     CBG: No results for input(s): "GLUCAP" in the last 168 hours.   No results found for this or any previous visit (from the past 240 hour(s)).       Radiology Studies: No results found.      Scheduled Meds:  amLODipine  10 mg Oral QPM   apixaban  2.5 mg Oral BID   atorvastatin  20 mg Oral QPM   Chlorhexidine Gluconate Cloth  6 each Topical Q0600   cholecalciferol  1,000 Units Oral q AM   finasteride  5 mg Oral q AM   hydrALAZINE  25 mg Oral BID   Continuous Infusions:  anticoagulant sodium citrate        LOS: 5 days    Time spent: 40 minutes    02/03/22, MD [facility]   To contact the attending provider between 7A-7P or the covering provider during after hours 7P-7A, please log into the web site [email or web address] and access using universal [email or web address] password for that web site. If you do not have the password, please call the hospital operator.  02/04/2022, 10:39 AM

## 2022-02-05 ENCOUNTER — Inpatient Hospital Stay (HOSPITAL_COMMUNITY): Payer: Medicare Other | Admitting: Anesthesiology

## 2022-02-05 ENCOUNTER — Inpatient Hospital Stay (HOSPITAL_COMMUNITY): Payer: Medicare Other

## 2022-02-05 ENCOUNTER — Encounter (HOSPITAL_COMMUNITY)
Admission: EM | Disposition: A | Discharge status: Home / Self Care | Payer: Self-pay | Source: Home / Self Care | Attending: Internal Medicine

## 2022-02-05 ENCOUNTER — Encounter (HOSPITAL_COMMUNITY): Payer: Self-pay | Admitting: Internal Medicine

## 2022-02-05 DIAGNOSIS — N1330 Unspecified hydronephrosis: Secondary | ICD-10-CM | POA: Diagnosis present

## 2022-02-05 DIAGNOSIS — E876 Hypokalemia: Secondary | ICD-10-CM

## 2022-02-05 DIAGNOSIS — N201 Calculus of ureter: Secondary | ICD-10-CM

## 2022-02-05 DIAGNOSIS — N400 Enlarged prostate without lower urinary tract symptoms: Secondary | ICD-10-CM | POA: Diagnosis not present

## 2022-02-05 DIAGNOSIS — I480 Paroxysmal atrial fibrillation: Secondary | ICD-10-CM

## 2022-02-05 DIAGNOSIS — N179 Acute kidney failure, unspecified: Secondary | ICD-10-CM | POA: Diagnosis not present

## 2022-02-05 DIAGNOSIS — I2510 Atherosclerotic heart disease of native coronary artery without angina pectoris: Secondary | ICD-10-CM

## 2022-02-05 DIAGNOSIS — E7800 Pure hypercholesterolemia, unspecified: Secondary | ICD-10-CM | POA: Diagnosis not present

## 2022-02-05 DIAGNOSIS — I10 Essential (primary) hypertension: Secondary | ICD-10-CM

## 2022-02-05 DIAGNOSIS — E8721 Acute metabolic acidosis: Secondary | ICD-10-CM | POA: Diagnosis not present

## 2022-02-05 HISTORY — PX: CYSTOSCOPY WITH URETEROSCOPY, STONE BASKETRY AND STENT PLACEMENT: SHX6378

## 2022-02-05 LAB — RENAL FUNCTION PANEL
Albumin: 3.4 g/dL — ABNORMAL LOW (ref 3.5–5.0)
Anion gap: 7 (ref 5–15)
BUN: 34 mg/dL — ABNORMAL HIGH (ref 8–23)
CO2: 26 mmol/L (ref 22–32)
Calcium: 8.9 mg/dL (ref 8.9–10.3)
Chloride: 107 mmol/L (ref 98–111)
Creatinine, Ser: 3.33 mg/dL — ABNORMAL HIGH (ref 0.61–1.24)
GFR, Estimated: 18 mL/min — ABNORMAL LOW (ref >60)
Glucose, Bld: 92 mg/dL (ref 70–99)
Phosphorus: 3.9 mg/dL (ref 2.5–4.6)
Potassium: 3.6 mmol/L (ref 3.5–5.1)
Sodium: 140 mmol/L (ref 135–145)

## 2022-02-05 LAB — CBC WITH DIFFERENTIAL/PLATELET
Abs Immature Granulocytes: 0.03 10*3/uL (ref 0.00–0.07)
Basophils Absolute: 0 10*3/uL (ref 0.0–0.1)
Basophils Relative: 0 %
Eosinophils Absolute: 0.3 10*3/uL (ref 0.0–0.5)
Eosinophils Relative: 3 %
HCT: 33.4 % — ABNORMAL LOW (ref 39.0–52.0)
Hemoglobin: 11.3 g/dL — ABNORMAL LOW (ref 13.0–17.0)
Immature Granulocytes: 0 %
Lymphocytes Relative: 17 %
Lymphs Abs: 1.4 10*3/uL (ref 0.7–4.0)
MCH: 32.7 pg (ref 26.0–34.0)
MCHC: 33.8 g/dL (ref 30.0–36.0)
MCV: 96.5 fL (ref 80.0–100.0)
Monocytes Absolute: 0.9 10*3/uL (ref 0.1–1.0)
Monocytes Relative: 11 %
Neutro Abs: 5.7 10*3/uL (ref 1.7–7.7)
Neutrophils Relative %: 69 %
Platelets: 129 10*3/uL — ABNORMAL LOW (ref 150–400)
RBC: 3.46 MIL/uL — ABNORMAL LOW (ref 4.22–5.81)
RDW: 14 % (ref 11.5–15.5)
WBC: 8.3 10*3/uL (ref 4.0–10.5)
nRBC: 0 % (ref 0.0–0.2)

## 2022-02-05 LAB — URINE CULTURE: Culture: NO GROWTH

## 2022-02-05 SURGERY — CYSTOSCOPY, WITH CALCULUS MANIPULATION OR REMOVAL
Anesthesia: General | Site: Ureter | Laterality: Left

## 2022-02-05 MED ORDER — SODIUM CHLORIDE 0.9% FLUSH
10.0000 mL | Freq: Two times a day (BID) | INTRAVENOUS | Status: DC
  Administered 2022-02-05 – 2022-02-06 (×2): 10 mL

## 2022-02-05 MED ORDER — FENTANYL CITRATE (PF) 100 MCG/2ML IJ SOLN
INTRAMUSCULAR | Status: AC
  Filled 2022-02-05: qty 2

## 2022-02-05 MED ORDER — SODIUM CHLORIDE 0.9% FLUSH: 10.0000 mL | INTRAVENOUS | Status: DC | PRN

## 2022-02-05 MED ORDER — IOHEXOL 300 MG/ML  SOLN
INTRAMUSCULAR | Status: DC | PRN
  Administered 2022-02-05: 50 mL

## 2022-02-05 MED ORDER — LIDOCAINE HCL (PF) 2 % IJ SOLN
INTRAMUSCULAR | Status: AC
  Filled 2022-02-05: qty 5

## 2022-02-05 MED ORDER — ONDANSETRON HCL 4 MG/2ML IJ SOLN
INTRAMUSCULAR | Status: DC | PRN
  Administered 2022-02-05: 4 mg via INTRAVENOUS

## 2022-02-05 MED ORDER — DEXAMETHASONE SODIUM PHOSPHATE 10 MG/ML IJ SOLN
INTRAMUSCULAR | Status: DC | PRN
  Administered 2022-02-05: 10 mg via INTRAVENOUS

## 2022-02-05 MED ORDER — HYDRALAZINE HCL 50 MG PO TABS
50.0000 mg | ORAL_TABLET | Freq: Three times a day (TID) | ORAL | Status: DC
  Administered 2022-02-05 – 2022-02-06 (×4): 50 mg via ORAL
  Filled 2022-02-05 (×4): qty 1

## 2022-02-05 MED ORDER — ONDANSETRON HCL 4 MG/2ML IJ SOLN
INTRAMUSCULAR | Status: AC
  Filled 2022-02-05: qty 2

## 2022-02-05 MED ORDER — CEFAZOLIN SODIUM-DEXTROSE 2-3 GM-%(50ML) IV SOLR
INTRAVENOUS | Status: DC | PRN
  Administered 2022-02-05: 2 g via INTRAVENOUS

## 2022-02-05 MED ORDER — PROPOFOL 10 MG/ML IV BOLUS
INTRAVENOUS | Status: DC | PRN
  Administered 2022-02-05: 50 mg via INTRAVENOUS
  Administered 2022-02-05: 150 mg via INTRAVENOUS

## 2022-02-05 MED ORDER — DEXAMETHASONE SODIUM PHOSPHATE 10 MG/ML IJ SOLN
INTRAMUSCULAR | Status: AC
  Filled 2022-02-05: qty 1

## 2022-02-05 MED ORDER — SODIUM CHLORIDE 0.9 % IR SOLN
Status: DC | PRN
  Administered 2022-02-05: 6000 mL

## 2022-02-05 MED ORDER — CEFAZOLIN SODIUM-DEXTROSE 2-4 GM/100ML-% IV SOLN
2.0000 g | INTRAVENOUS | Status: AC
  Administered 2022-02-06: 2 g via INTRAVENOUS
  Filled 2022-02-05 (×2): qty 100

## 2022-02-05 MED ORDER — PHENYLEPHRINE 80 MCG/ML (10ML) SYRINGE FOR IV PUSH (FOR BLOOD PRESSURE SUPPORT)
PREFILLED_SYRINGE | INTRAVENOUS | Status: DC | PRN
  Administered 2022-02-05 (×3): 80 ug via INTRAVENOUS

## 2022-02-05 MED ORDER — PROPOFOL 10 MG/ML IV BOLUS
INTRAVENOUS | Status: AC
  Filled 2022-02-05: qty 20

## 2022-02-05 MED ORDER — PHENYLEPHRINE 80 MCG/ML (10ML) SYRINGE FOR IV PUSH (FOR BLOOD PRESSURE SUPPORT)
PREFILLED_SYRINGE | INTRAVENOUS | Status: AC
  Filled 2022-02-05: qty 10

## 2022-02-05 MED ORDER — LACTATED RINGERS IV SOLN: INTRAVENOUS | Status: DC

## 2022-02-05 MED ORDER — FENTANYL CITRATE (PF) 100 MCG/2ML IJ SOLN
INTRAMUSCULAR | Status: DC | PRN
  Administered 2022-02-05 (×2): 50 ug via INTRAVENOUS

## 2022-02-05 MED ORDER — ARTIFICIAL TEARS OPHTHALMIC OINT
TOPICAL_OINTMENT | OPHTHALMIC | Status: AC
  Filled 2022-02-05: qty 7

## 2022-02-05 MED ORDER — CEFAZOLIN SODIUM-DEXTROSE 2-4 GM/100ML-% IV SOLN
INTRAVENOUS | Status: AC
  Filled 2022-02-05: qty 100

## 2022-02-05 MED ORDER — LIDOCAINE 2% (20 MG/ML) 5 ML SYRINGE
INTRAMUSCULAR | Status: DC | PRN
  Administered 2022-02-05: 50 mg via INTRAVENOUS

## 2022-02-05 SURGICAL SUPPLY — 23 items
BAG COUNTER SPONGE SURGICOUNT (BAG) IMPLANT
BAG URO CATCHER STRL LF (MISCELLANEOUS) ×1 IMPLANT
BASKET ZERO TIP NITINOL 2.4FR (BASKET) IMPLANT
CATH URETL OPEN END 6FR 70 (CATHETERS) IMPLANT
CLOTH BEACON ORANGE TIMEOUT ST (SAFETY) ×1 IMPLANT
GLOVE SURG LX 7.5 STRW (GLOVE) ×1
GLOVE SURG LX STRL 7.5 STRW (GLOVE) ×1 IMPLANT
GOWN STRL REUS W/ TWL XL LVL3 (GOWN DISPOSABLE) ×1 IMPLANT
GOWN STRL REUS W/TWL XL LVL3 (GOWN DISPOSABLE) ×1
GUIDEWIRE STR DUAL SENSOR (WIRE) ×1 IMPLANT
GUIDEWIRE ZIPWRE .038 STRAIGHT (WIRE) IMPLANT
IV NS 1000ML (IV SOLUTION) ×1
IV NS 1000ML BAXH (IV SOLUTION) ×1 IMPLANT
KIT TURNOVER KIT A (KITS) IMPLANT
LASER FIB FLEXIVA PULSE ID 365 (Laser) IMPLANT
MANIFOLD NEPTUNE II (INSTRUMENTS) ×1 IMPLANT
PACK CYSTO (CUSTOM PROCEDURE TRAY) ×1 IMPLANT
SHEATH NAVIGATOR HD 11/13X36 (SHEATH) IMPLANT
STENT URET 6FRX26 CONTOUR (STENTS) IMPLANT
TRACTIP FLEXIVA PULS ID 200XHI (Laser) IMPLANT
TRACTIP FLEXIVA PULSE ID 200 (Laser)
TUBING CONNECTING 10 (TUBING) ×1 IMPLANT
TUBING UROLOGY SET (TUBING) ×1 IMPLANT

## 2022-02-05 NOTE — Progress Notes (Signed)
[facility] Progress Note   Assessment/ Plan:    AKI: baseline cr was 1.13 as of 11/2020.  Presented at  65 in the setting of recent addition of HCTZ (July 17th per records), stone, and then CTA with contrast.               - no real symptoms for GN             - suspect stone,  the HCTZ and then th             - hematuria likely 2/2 stone             - hold ARB and HCTZ             - s/p IVFs             - s/p HD #1 8/17  - HD #2 8/19  - UOP stable and SCr ok, K ok. NO HD needed  - He will stay at [facility] after ureteroscopy; still has temp HD cath, can consider removal tomorrow   2.  Hyperkalemia- improved             - off lokelma  3.  Metabolic acidosis:  - off bicarb   4.  HTN:             - amlodipine ok    5.  Dispo: inpt  Subjective:    Found to have 44mm distal L ureteral stone, urology to perform ureteroscopy/stent today  Feels well, no c/o Good UOP SCr up a tad   Objective:   BP (!) 141/81 (BP Location: Right Arm)   Pulse 67   Temp 97.8 F (36.6 C) (Oral)   Resp 14   Ht 6' (1.829 m)   Wt 102.4 kg   SpO2 100%   BMI 30.61 kg/m   Intake/Output Summary (Last 24 hours) at 02/05/2022 1102 Last data filed at 02/05/2022 1100 Gross per 24 hour  Intake 240 ml  Output 1000 ml  Net -760 ml    Weight change: -3.629 kg  Physical Exam: GEN NAD, sitting in bed  HEENT EOMI PERRL NECK no JVD PULM clear CV RRR crisp S2 ABD soft, nondistended EXT trace LE edema NEURO AAO x 3 nonfocal SKIN no rashes MSK no effusions R IJ Temp HD cath  Imaging: CT RENAL STONE STUDY  Result Date: 02/04/2022 CLINICAL DATA:  Hematuria EXAM: CT ABDOMEN AND PELVIS WITHOUT CONTRAST TECHNIQUE: Multidetector CT imaging of the abdomen and pelvis was performed following the standard protocol without IV contrast. RADIATION DOSE REDUCTION: This exam was performed according to the departmental dose-optimization program which includes automated exposure control, adjustment of the  mA and/or kV according to patient size and/or use of iterative reconstruction technique. COMPARISON:  01/30/2022 renal ultrasound 11/09/2020 abdominopelvic CTA FINDINGS: Lower chest: Bibasilar scarring. Bibasilar pulmonary nodules on the order of 4 mm and less were similar on the prior and can be presumed benign. Cardiomegaly with prior aortic valve repair. Pacer. Right coronary artery calcification. Hepatobiliary: Normal noncontrast appearance of the liver and gallbladder, given mild motion degradation. Pancreas: Fatty replacement especially in the head and uncinate process. No duct dilatation or acute inflammation. Spleen: Normal in size, without focal abnormality. Adrenals/Urinary Tract: Normal adrenal glands. right renal cortical thinning with multiple low-density right greater than left renal lesions which are primarily felt to represent cysts. A 2.0 cm interpolar right renal lesion on 42/3 measures slightly greater than fluid density both today and  on the postcontrast exam of 11/09/2020. Decreased in size since that exam, most consistent with a minimally complex cyst . In the absence of clinically indicated signs/symptoms require(s) no independent follow-up. A dominant exophytic left renal 8.4 cm low density lesion is similar in size to on the prior and most consistent with a minimally complex cyst . In the absence of clinically indicated signs/symptoms require(s) no independent follow-up. left renal collecting system calculi of maximally 7 mm. Mild left-sided hydroureter with nonspecific perirenal and Periureteric interstitial thickening. Distal left ureteric stone measures 8 mm on 80/3. Bladder base 11 mm stone on 78/3. Stomach/Bowel: Normal stomach, without wall thickening. Normal terminal ileum. Normal small bowel. Vascular/Lymphatic: Aortic atherosclerosis. No abdominopelvic adenopathy. Reproductive: Mild prostatomegaly. Other: No significant free fluid. No free intraperitoneal air. Small fat containing  paraumbilical hernia. Musculoskeletal: Lumbosacral spondylosis. IMPRESSION: 1. Mild left-sided hydroureteronephrosis secondary to a 8 mm distal left ureteric stone. 2. Bladder base stone. 3. Left nephrolithiasis. 4. Right renal atrophy. 5. Coronary artery atherosclerosis. 6. Prostatomegaly. 7. Aortic Atherosclerosis (ICD10-I70.0). 8. Mild motion degradation. Electronically Signed   By: Jeronimo Greaves M.D.   On: 02/04/2022 12:11   DG Chest 2 View  Result Date: 02/04/2022 CLINICAL DATA:  Cough EXAM: CHEST - 2 VIEW COMPARISON:  01/30/2022 FINDINGS: Bilateral mild interstitial thickening. No focal consolidation. No pleural effusion or pneumothorax. Stable cardiomegaly. Prior CABG. Dual lead cardiac pacemaker. Right-sided jugular central venous catheter with the tip projecting over the SVC. Prior aortic valve replacement. No acute osseous abnormality. IMPRESSION: 1. Cardiomegaly with mild pulmonary vascular congestion. Electronically Signed   By: Elige Ko M.D.   On: 02/04/2022 11:57    Labs: BMET Recent Labs  Lab 01/31/22 0130 01/31/22 1248 02/01/22 0325 02/01/22 1412 02/02/22 0342 02/03/22 0220 02/04/22 0635 02/05/22 0614  NA 138 139 138 138 136 139 138 140  K 5.7* 6.1* 5.3* 5.1 3.7 4.6 3.3* 3.6  CL 108 109 108 106 103 101 103 107  CO2 16* 16* 17* 18* 22 22 23 26   GLUCOSE 95 89 107* 98 87 87 106* 92  BUN 117* 116* 118* 117* 80* 87* 29* 34*  CREATININE 11.44* 11.51* 12.20* 12.67* 9.29* 10.31* 2.47* 3.33*  CALCIUM 9.4 9.1 8.7* 9.0 8.5* 8.7* 9.3 8.9  PHOS 7.8* 7.6* 8.6* 8.4*  --   --   --  3.9    CBC Recent Labs  Lab 01/30/22 1241 01/31/22 0130 02/01/22 0325 02/01/22 1412 02/04/22 0635 02/05/22 0614  WBC 7.4   < > 5.2 5.6 9.2 8.3  NEUTROABS 5.7  --   --   --   --  5.7  HGB 12.5*   < > 10.6* 11.7* 12.5* 11.3*  HCT 36.2*   < > 30.2* 33.1* 35.5* 33.4*  MCV 96.0   < > 94.4 93.8 93.7 96.5  PLT 132*   < > 122* 123* 138* 129*   < > = values in this interval not displayed.      Medications:     amLODipine  10 mg Oral QPM   atorvastatin  20 mg Oral QPM   Chlorhexidine Gluconate Cloth  6 each Topical Q0600   cholecalciferol  1,000 Units Oral q AM   finasteride  5 mg Oral q AM   guaiFENesin  1,200 mg Oral BID   hydrALAZINE  50 mg Oral Q8H   02/07/22, MD  02/05/2022, 11:02 AM

## 2022-02-05 NOTE — Progress Notes (Signed)
Patient ID: Derek Washington, male   DOB: [birth year], 81 y.o.   MRN: [id number]  Day of Surgery Subjective: I discussed patient's situation with Dr. Ronne Binning in detail last evening.  Derek Washington is well know to me and followed for BPH.   He had AKI due to contrast nephropathy initially and required HD.  He then developed left flank pain in the last 24-48 hrs. He has an obstructing left distal ureteral stone with AKI and an atrophic right kidney and his Cr is now rising again after having decreased initially.  Pain has been controlled.  Objective: Vital signs in last 24 hours: Temp:  [97.7 F (36.5 C)-98 F (36.7 C)] 97.7 F (36.5 C) (08/20 2151) Pulse Rate:  [73-77] 73 (08/21 0537) Resp:  [16-18] 16 (08/21 0537) BP: (157-186)/(86-95) 172/87 (08/21 0537) SpO2:  [99 %] 99 % (08/20 1321) Weight:  [102.4 kg] 102.4 kg (08/21 0537)  Intake/Output from previous day: 08/20 0701 - 08/21 0700 In: 480 [P.O.:480] Out: 825 [Urine:825] Intake/Output this shift: No intake/output data recorded.  Physical Exam:  General: Alert and oriented Abd: Mild left CVAT  Lab Results: Recent Labs    02/04/22 0635 02/05/22 0614  HGB 12.5* 11.3*  HCT 35.5* 33.4*   BMET Recent Labs    02/04/22 0635 02/05/22 0614  NA 138 140  K 3.3* 3.6  CL 103 107  CO2 23 26  GLUCOSE 106* 92  BUN 29* 34*  CREATININE 2.47* 3.33*  CALCIUM 9.3 8.9     Studies/Results: CT RENAL STONE STUDY  Result Date: 02/04/2022 CLINICAL DATA:  Hematuria EXAM: CT ABDOMEN AND PELVIS WITHOUT CONTRAST TECHNIQUE: Multidetector CT imaging of the abdomen and pelvis was performed following the standard protocol without IV contrast. RADIATION DOSE REDUCTION: This exam was performed according to the departmental dose-optimization program which includes automated exposure control, adjustment of the mA and/or kV according to patient size and/or use of iterative reconstruction technique. COMPARISON:  01/30/2022 renal ultrasound 11/09/2020  abdominopelvic CTA FINDINGS: Lower chest: Bibasilar scarring. Bibasilar pulmonary nodules on the order of 4 mm and less were similar on the prior and can be presumed benign. Cardiomegaly with prior aortic valve repair. Pacer. Right coronary artery calcification. Hepatobiliary: Normal noncontrast appearance of the liver and gallbladder, given mild motion degradation. Pancreas: Fatty replacement especially in the head and uncinate process. No duct dilatation or acute inflammation. Spleen: Normal in size, without focal abnormality. Adrenals/Urinary Tract: Normal adrenal glands. right renal cortical thinning with multiple low-density right greater than left renal lesions which are primarily felt to represent cysts. A 2.0 cm interpolar right renal lesion on 42/3 measures slightly greater than fluid density both today and on the postcontrast exam of 11/09/2020. Decreased in size since that exam, most consistent with a minimally complex cyst . In the absence of clinically indicated signs/symptoms require(s) no independent follow-up. A dominant exophytic left renal 8.4 cm low density lesion is similar in size to on the prior and most consistent with a minimally complex cyst . In the absence of clinically indicated signs/symptoms require(s) no independent follow-up. left renal collecting system calculi of maximally 7 mm. Mild left-sided hydroureter with nonspecific perirenal and Periureteric interstitial thickening. Distal left ureteric stone measures 8 mm on 80/3. Bladder base 11 mm stone on 78/3. Stomach/Bowel: Normal stomach, without wall thickening. Normal terminal ileum. Normal small bowel. Vascular/Lymphatic: Aortic atherosclerosis. No abdominopelvic adenopathy. Reproductive: Mild prostatomegaly. Other: No significant free fluid. No free intraperitoneal air. Small fat containing paraumbilical hernia. Musculoskeletal: Lumbosacral spondylosis. IMPRESSION: 1.  Mild left-sided hydroureteronephrosis secondary to a 8 mm  distal left ureteric stone. 2. Bladder base stone. 3. Left nephrolithiasis. 4. Right renal atrophy. 5. Coronary artery atherosclerosis. 6. Prostatomegaly. 7. Aortic Atherosclerosis (ICD10-I70.0). 8. Mild motion degradation. Electronically Signed   By: Jeronimo Greaves M.D.   On: 02/04/2022 12:11   DG Chest 2 View  Result Date: 02/04/2022 CLINICAL DATA:  Cough EXAM: CHEST - 2 VIEW COMPARISON:  01/30/2022 FINDINGS: Bilateral mild interstitial thickening. No focal consolidation. No pleural effusion or pneumothorax. Stable cardiomegaly. Prior CABG. Dual lead cardiac pacemaker. Right-sided jugular central venous catheter with the tip projecting over the SVC. Prior aortic valve replacement. No acute osseous abnormality. IMPRESSION: 1. Cardiomegaly with mild pulmonary vascular congestion. Electronically Signed   By: Elige Ko M.D.   On: 02/04/2022 11:57    Assessment/Plan: 1) 8 mm distal left ureteral stone with atrophic right kidney and AKI: Will plan to proceed with cystoscopy/left ureteral stent placement with left ureteroscopic laser lithotripsy and stone removal if amenable.  Derek Washington understands that our primary goal is to relieve his obstruction to resolve his renal dysfunction but that we will attempt to definitively treat his ureteral stone is we are able with minimal risk.  I discussed the potential benefits and risks of the procedure, side effects of the proposed treatment, the likelihood of the patient achieving the goals of the procedure, and any potential problems that might occur during the procedure or recuperation.   He gives informed consent to proceed.    LOS: 6 days   [name] 02/05/2022, 8:50 AM

## 2022-02-05 NOTE — Progress Notes (Signed)
PROGRESS NOTE    Derek Washington  [id number] DOB: 1940/11/27 DOA: 01/30/2022 PCP: Josetta Huddle, MD    Chief Complaint  Patient presents with   Abnormal Lab    Brief Narrative:  Patient is a pleasant 81 year old gentleman history of bicuspid aortic valve status post AVR, status post PPM, hypertension, hyperlipidemia, BPH presented to the ED with abnormal labs and fatigue noted to be in acute renal failure.  Patient noted to have been seen by PCP in July and noted to be on ARB and Norvasc with HCTZ added for better blood pressure control.  Patient noted to have also undergone CT angiogram on 01/22/2022 for follow-up on AVR and aortic valve dilatation with contrast noted.  Patient presented with a week of fatigue, generalized weakness.  Noted to have significant acute kidney injury and sent to the ED.  Patient noted on admission to have a creatinine of 11.61, potassium of 6.1.  Patient admitted for further evaluation and management.  Nephrology consulted.   Assessment & Plan:   Principal Problem:   AKI (acute kidney injury) ([place]) Active Problems:   Gastroesophageal reflux disease   High cholesterol   S/P AVR (aortic valve replacement)   Pacemaker - MDT   BPH (benign prostatic hyperplasia)   Hypertension   Hyperkalemia   Acute metabolic acidosis   Paroxysmal A-fib ([facility])   Hypokalemia   Left ureteral calculus   Hydroureteronephrosis: Left secondary to 8 mm distal left ureteral calculus  1 acute kidney injury (baseline creatinine approximately 1.13 11/2020) -Patient noted on admission with creatinine of 11.39.  Potassium of 6.4. -Renal function was initially trending up to 12.67, underwent intermittent hemodialysis #1, 02/01/2022 with creatinine dropping down to 9.29 and back up to 10.31 (02/03/2022) -Creatinine today at 3.33 from 2.47 after HD #2 02/03/2022. -Likely secondary to ATN/prerenal azotemia in the setting of HCTZ and recent contrast from CT angiogram. -Patient states making  urine, urine output of 825 cc recorded over the past 24 hours.  -Renal ultrasound negative for hydronephrosis, echogenic kidneys bilaterally with right renal cortical thinning compatible with medical renal disease, bilateral renal cysts, 1.4 cm calculus in the bladder, prostatomegaly. -Urinalysis nitrite negative, leukocytes negative, negative for ketones, negative for protein, negative for glucose. -Urine sodium of 96, urine creatinine of 60. -Status post IV fluids with no significant improvement with renal function early on during the hospitalization.   -Patient being followed by nephrology who are recommended intermittent dialysis with HD #1 on 02/01/2022 with improvement with renal function initially and trending back up 10.31 (02/03/2022). -Patient status post HD #2 02/03/2022 with creatinine down to 3.33. -Continue to hold ARB, HCTZ and likely will not resume on discharge. -Patient noted to have hematuria, see problem #9. -Per nephrology.  2.  Hyperkalemia/hypokalemia -Likely secondary to problem #1. -Patient underwent hemodialysis #1 on 02/01/2022. -Repeat potassium this morning at 3.6 from 3.3 from 4.6 from 3.7 from 5.1 from 5.3 from 6.1 from 5.7 from 6.4.   -Discontinued Lokelma.   -Follow.  3.  Metabolic acidosis -Likely secondary to problem #1. -Resolved on bicarb tablets.  -Per nephrology. -Follow.  4.  Hypertension -Continue current dose of Norvasc.   -Continue to hold HCTZ, ARB secondary to acute renal failure and likely will not resume on discharge. -Increase hydralazine to 50 mg 3 times daily for better blood pressure control.  5.  History of BPH -Continue finasteride. -Renal ultrasound negative for hydronephrosis on admission however CT renal stone protocol with mild left hydroureteronephrosis secondary to left ureteral calculus. -  Follow-up.  6.  Hyperlipidemia -Statin.  7.  History of aortic valve replacement -Stable. -Outpatient follow-up.  8.  Paroxysmal  atrial fibrillation -Currently rate controlled. -Continue to hold Eliquis in anticipation of urological procedure today.   -Urology to advise when Eliquis may be resumed.   9.  Obesity -As evidenced by BMI of 31.91 kg/m2 -Lifestyle modification -Outpatient follow-up with PCP.  9.  Hematuria/secondary to ureteral calculus/mild left hydroureteronephrosis -Patient noted with hematuria the evening of 02/03/2022-02/04/2022 -Hematuria improved. -Urinalysis with large hemoglobin, trace leukocytes, negative nitrite, rare bacteria, RBC > 50, WBC 0-5. -Patient states history of nephrolithiasis. -Renal ultrasound done early on during the hospitalization negative for hydronephrosis, echogenic kidneys bilaterally with right renal cortical thinning compatible with medical renal disease, bilateral renal cysts.  1.4 cm calculus in the bladder.  Prostamegaly. -Urine cultures with no growth. -CT renal stone protocol with mild left hydroureteronephrosis secondary to 8 mm distal left urethral stone. -Eliquis on hold. -Patient seen in consultation by urology, Dr. Ronne Binning 02/04/2022 and patient for ureteral stent placement and possible left ureteroscopic stone extraction by urology today. -Urology to advise when Eliquis may be resumed.  DVT prophylaxis: Eliquis>>> SCDs Code Status: DNR Family Communication: Updated patient and wife at bedside. Disposition: Home when clinically improved, resolution of acute renal failure, when cleared by nephrology and urology.  Status is: Inpatient Remains inpatient appropriate because: Severity of illness   Consultants:  Nephrology: Dr. Signe Colt 01/30/2022 IR Urology: Dr. Ronne Binning 02/04/2022  Procedures:  Chest x-ray 01/30/2022, 02/04/2022 Renal ultrasound 01/30/2022 Temporary hemodialysis catheter placement per IR, Dr. Elby Showers 02/01/2022 Renal ultrasound 01/30/2022. CT renal stone protocol 02/04/2022  Antimicrobials:  None   Subjective: Sitting up in bed.  States  hematuria has cleared up.  Some complaints of left-sided pain.  No chest pain.  No shortness of breath.  Feels cough is improving.    Objective: Vitals:   02/04/22 1743 02/04/22 2148 02/04/22 2151 02/05/22 0537  BP: (!) 186/95 (!) 165/86  (!) 172/87  Pulse:    73  Resp:   18 16  Temp:   97.7 F (36.5 C)   TempSrc:   Oral Oral  SpO2:      Weight:    102.4 kg  Height:        Intake/Output Summary (Last 24 hours) at 02/05/2022 0945 Last data filed at 02/05/2022 0541 Gross per 24 hour  Intake 240 ml  Output 700 ml  Net -460 ml   Filed Weights   02/03/22 0631 02/04/22 0620 02/05/22 0537  Weight: 105.4 kg 106 kg 102.4 kg    Examination:  General exam: NAD. Respiratory system: CTA B.  No wheezes, no crackles, no rhonchi.  Fair air movement.  Speaking in full sentences.  Cardiovascular system: RRR with 3/6 SEM RUSB, LUSB.  No JVD.  No lower extremity edema.  Gastrointestinal system: Abdomen is soft, nontender, nondistended, positive bowel sounds.  No rebound.  No guarding.  No CVA tenderness to palpation. Central nervous system: Alert and oriented. No focal neurological deficits. Extremities: Symmetric 5 x 5 power. Skin: No rashes, lesions or ulcers Psychiatry: Judgement and insight appear normal. Mood & affect appropriate.     Data Reviewed: I have personally reviewed following labs and imaging studies  CBC: Recent Labs  Lab 01/30/22 1241 01/31/22 0130 02/01/22 0325 02/01/22 1412 02/04/22 0635 02/05/22 0614  WBC 7.4 7.6 5.2 5.6 9.2 8.3  NEUTROABS 5.7  --   --   --   --  5.7  HGB 12.5*  12.0* 10.6* 11.7* 12.5* 11.3*  HCT 36.2* 35.6* 30.2* 33.1* 35.5* 33.4*  MCV 96.0 97.3 94.4 93.8 93.7 96.5  PLT 132* 135* 122* 123* 138* 129*    Basic Metabolic Panel: Recent Labs  Lab 01/31/22 0130 01/31/22 1248 02/01/22 0325 02/01/22 1412 02/02/22 0342 02/03/22 0220 02/04/22 0635 02/05/22 0614  NA 138 139 138 138 136 139 138 140  K 5.7* 6.1* 5.3* 5.1 3.7 4.6 3.3* 3.6  CL  108 109 108 106 103 101 103 107  CO2 16* 16* 17* 18* 22 22 23 26   GLUCOSE 95 89 107* 98 87 87 106* 92  BUN 117* 116* 118* 117* 80* 87* 29* 34*  CREATININE 11.44* 11.51* 12.20* 12.67* 9.29* 10.31* 2.47* 3.33*  CALCIUM 9.4 9.1 8.7* 9.0 8.5* 8.7* 9.3 8.9  MG 2.2  --  2.3  --   --   --   --   --   PHOS 7.8* 7.6* 8.6* 8.4*  --   --   --  3.9    GFR: Estimated Creatinine Clearance: 21.9 mL/min (A) (by C-G formula based on SCr of 3.33 mg/dL (H)).  Liver Function Tests: Recent Labs  Lab 01/30/22 1241 01/31/22 0130 01/31/22 1248 02/01/22 0325 02/01/22 1412 02/05/22 0614  AST 14* 13*  --   --   --   --   ALT 24 24  --   --   --   --   ALKPHOS 46 43  --   --   --   --   BILITOT 0.9 1.3*  --   --   --   --   PROT 6.5 6.2*  --   --   --   --   ALBUMIN 3.8 3.6 3.4* 2.9* 3.4* 3.4*    CBG: No results for input(s): "GLUCAP" in the last 168 hours.   Recent Results (from the past 240 hour(s))  Urine Culture     Status: None   Collection Time: 02/04/22  8:49 AM   Specimen: Urine, Clean Catch  Result Value Ref Range Status   Specimen Description URINE, CLEAN CATCH  Final   Special Requests NONE  Final   Culture   Final    NO GROWTH Performed at [facility], [address], [place], [place] [address]    Report Status 02/05/2022 FINAL  Final         Radiology Studies: CT RENAL STONE STUDY  Result Date: 02/04/2022 CLINICAL DATA:  Hematuria EXAM: CT ABDOMEN AND PELVIS WITHOUT CONTRAST TECHNIQUE: Multidetector CT imaging of the abdomen and pelvis was performed following the standard protocol without IV contrast. RADIATION DOSE REDUCTION: This exam was performed according to the departmental dose-optimization program which includes automated exposure control, adjustment of the mA and/or kV according to patient size and/or use of iterative reconstruction technique. COMPARISON:  01/30/2022 renal ultrasound 11/09/2020 abdominopelvic CTA FINDINGS: Lower chest: Bibasilar scarring.  Bibasilar pulmonary nodules on the order of 4 mm and less were similar on the prior and can be presumed benign. Cardiomegaly with prior aortic valve repair. Pacer. Right coronary artery calcification. Hepatobiliary: Normal noncontrast appearance of the liver and gallbladder, given mild motion degradation. Pancreas: Fatty replacement especially in the head and uncinate process. No duct dilatation or acute inflammation. Spleen: Normal in size, without focal abnormality. Adrenals/Urinary Tract: Normal adrenal glands. right renal cortical thinning with multiple low-density right greater than left renal lesions which are primarily felt to represent cysts. A 2.0 cm interpolar right renal lesion on 42/3  measures slightly greater than fluid density both today and on the postcontrast exam of 11/09/2020. Decreased in size since that exam, most consistent with a minimally complex cyst . In the absence of clinically indicated signs/symptoms require(s) no independent follow-up. A dominant exophytic left renal 8.4 cm low density lesion is similar in size to on the prior and most consistent with a minimally complex cyst . In the absence of clinically indicated signs/symptoms require(s) no independent follow-up. left renal collecting system calculi of maximally 7 mm. Mild left-sided hydroureter with nonspecific perirenal and Periureteric interstitial thickening. Distal left ureteric stone measures 8 mm on 80/3. Bladder base 11 mm stone on 78/3. Stomach/Bowel: Normal stomach, without wall thickening. Normal terminal ileum. Normal small bowel. Vascular/Lymphatic: Aortic atherosclerosis. No abdominopelvic adenopathy. Reproductive: Mild prostatomegaly. Other: No significant free fluid. No free intraperitoneal air. Small fat containing paraumbilical hernia. Musculoskeletal: Lumbosacral spondylosis. IMPRESSION: 1. Mild left-sided hydroureteronephrosis secondary to a 8 mm distal left ureteric stone. 2. Bladder base stone. 3. Left  nephrolithiasis. 4. Right renal atrophy. 5. Coronary artery atherosclerosis. 6. Prostatomegaly. 7. Aortic Atherosclerosis (ICD10-I70.0). 8. Mild motion degradation. Electronically Signed   By: Jeronimo Greaves M.D.   On: 02/04/2022 12:11   DG Chest 2 View  Result Date: 02/04/2022 CLINICAL DATA:  Cough EXAM: CHEST - 2 VIEW COMPARISON:  01/30/2022 FINDINGS: Bilateral mild interstitial thickening. No focal consolidation. No pleural effusion or pneumothorax. Stable cardiomegaly. Prior CABG. Dual lead cardiac pacemaker. Right-sided jugular central venous catheter with the tip projecting over the SVC. Prior aortic valve replacement. No acute osseous abnormality. IMPRESSION: 1. Cardiomegaly with mild pulmonary vascular congestion. Electronically Signed   By: Elige Ko M.D.   On: 02/04/2022 11:57        Scheduled Meds:  amLODipine  10 mg Oral QPM   atorvastatin  20 mg Oral QPM   Chlorhexidine Gluconate Cloth  6 each Topical Q0600   cholecalciferol  1,000 Units Oral q AM   finasteride  5 mg Oral q AM   guaiFENesin  1,200 mg Oral BID   hydrALAZINE  50 mg Oral Q8H   Continuous Infusions:  anticoagulant sodium citrate        LOS: 6 days    Time spent: 40 minutes    Ramiro Harvest, MD [facility]   To contact the attending provider between 7A-7P or the covering provider during after hours 7P-7A, please log into the web site [email or web address] and access using universal [place] password for that web site. If you do not have the password, please call the hospital operator.  02/05/2022, 9:45 AM

## 2022-02-05 NOTE — Anesthesia Procedure Notes (Signed)
Procedure Name: LMA Insertion Date/Time: 02/05/2022 1:27 PM  Performed by: Doran Clay, CRNAPre-anesthesia Checklist: Patient identified, Emergency Drugs available, Suction available, Patient being monitored and Timeout performed Patient Re-evaluated:Patient Re-evaluated prior to induction Oxygen Delivery Method: Circle system utilized Preoxygenation: Pre-oxygenation with 100% oxygen Induction Type: IV induction LMA: LMA with gastric port inserted LMA Size: 5.0 Number of attempts: 2 (Attempted with LMA 5 unable to seat.Constant leak. LMA 5 removed switched to Proseal LMA 5) Placement Confirmation: positive ETCO2 and breath sounds checked- equal and bilateral Tube secured with: Tape Dental Injury: Teeth and Oropharynx as per pre-operative assessment

## 2022-02-05 NOTE — Anesthesia Preprocedure Evaluation (Addendum)
Anesthesia Evaluation  Patient identified by MRN, date of birth, ID band Patient awake    Reviewed: Allergy & Precautions, NPO status , Patient's Chart, lab work & pertinent test results  Airway Mallampati: II  TM Distance: >3 FB Neck ROM: Full    Dental  (+) Teeth Intact, Dental Advisory Given   Pulmonary  11/29/2020 SARS coronavirus NEG   Pulmonary exam normal        Cardiovascular hypertension, Pt. on medications + CAD (minimal ASCAD)  + dysrhythmias + pacemaker + Valvular Problems/Murmurs (bicuspid aortic valve) AS  Rhythm:Regular Rate:Normal + Systolic murmurs Pacer interrogation 12/2021 AP VP Percent: 44.19 %  AP VS Percent: 0.02 %  AS VP Percent: 54.62 %  AS VS Percent: 1.17 %   RA Percent Paced: 44.72 %  RV Percent Paced: 98.81 %     Echo 01/2021 1. There is a 26 mm Inspiris valve present in the aortic position Aortic valve regurgitation is not visualized. Aortic valve mean gradient measures 12.0 mmHg. Aortic valve Vmax measures 2.33 m/s. Aortic valve acceleration time measures 71 msec.DVI 0.45.  Suggestive of stable prosthetic function.  2. Left ventricular ejection fraction, by estimation, is 55 to 60%. The left ventricular internal cavity size was normal in size. There is mild concentric left ventricular hypertrophy of the basal-septal segment. Abnormal (paradoxical) septal motion consistent with post-operative status. Left ventricular diastolic parameters are consistent with Grade I diastolic dysfunction (impaired relaxation).  3. Right ventricular systolic function is normal. The right ventricular size is normal.  4. Aortic dilatation noted. There is mild dilatation of the ascending aorta, measuring 42 mm.  5. The mitral valve is normal in structure. Trivial mitral valve regurgitation. No evidence of mitral stenosis.   R/LHC 10/2020 RPAV lesion is 50% stenosed. 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    10/2020 ECHO: EF 55 to 60%. The LV has normal function, no regional wall motion abnormalities. There is mild concentric LVH. Mildly decreased RV function. Grade I DD, biatrial dilation, bicuspid aortic valve, severe calcifcation and of the aortic valve. Mild AI, mod AS with  Aortic regurgitation PHT 595 msec. AV mean gradient  36.0 mmHg. Aortic valve Vmax measures 3.71 m/s.  Moderate dilatation of the ascending aorta, measuring 44 mm. Mild TR    Neuro/Psych    GI/Hepatic GERD  ,  Endo/Other    Renal/GU Renal Insufficiency and ARFRenal disease     Musculoskeletal   Abdominal (+)  Abdomen: soft.    Peds  Hematology   Anesthesia Other Findings   Reproductive/Obstetrics                            Anesthesia Physical Anesthesia Plan  ASA: 3  Anesthesia Plan: General   Post-op Pain Management: Tylenol PO (pre-op)* and Minimal or no pain anticipated   Induction: Intravenous  PONV Risk Score and Plan: 3 and Ondansetron, Dexamethasone and Treatment may vary due to age or medical condition  Airway Management Planned: LMA and Oral ETT  Additional Equipment:   Intra-op Plan:   Post-operative Plan: Extubation in OR  Informed Consent: I have reviewed the patients History and Physical, chart, labs and discussed the procedure including the risks, benefits and alternatives for the proposed anesthesia with the patient or authorized representative who has indicated his/her understanding and acceptance.     Dental advisory given  Plan Discussed with: CRNA  Anesthesia Plan Comments:        Anesthesia  Quick Evaluation

## 2022-02-05 NOTE — Transfer of Care (Signed)
Immediate Anesthesia Transfer of Care Note  Patient: Derek Washington  Procedure(s) Performed: CYSTOSCOPY WITH LEFT URERETAL STENT PLACEMENT, RETROGRADE PYELOGRAM, URETEROSCOPY WITH STONE EXTRACTION, LASER LITHOTRIPSY (Left: Ureter)  Patient Location: PACU  Anesthesia Type:General  Level of Consciousness: sedated  Airway & Oxygen Therapy: Patient Spontanous Breathing and Patient connected to face mask oxygen  Post-op Assessment: Report given to RN and Post -op Vital signs reviewed and stable  Post vital signs: Reviewed and stable  Last Vitals:  Vitals Value Taken Time  BP    Temp    Pulse 60 02/05/22 1426  Resp 9 02/05/22 1426  SpO2 100 % 02/05/22 1426  Vitals shown include unvalidated device data.  Last Pain:  Vitals:   02/05/22 1221  TempSrc: Oral  PainSc: 0-No pain         Complications: No notable events documented.

## 2022-02-05 NOTE — Op Note (Signed)
Preoperative diagnosis: Left ureteral calculus, AKI  Postoperative diagnosis: Left ureteral calculus, AKI  Procedure:  Cystoscopy Left ureteroscopy and stone removal Ureteroscopic laser lithotripsy Left ureteral stent placement (6 x 26 - no string) Left retrograde pyelography with interpretation  Surgeon: Moody Bruins. M.D.  Anesthesia: General  Complications: None  Intraoperative findings: Left retrograde pyelography demonstrated a filling defect within the distal left ureter consistent with the patient's known calculus without other abnormalities.  EBL: Minimal  Specimens: Left ureteral calculus  Disposition of specimens: [facility] for stone analysis  Indication: Derek Washington is a 81 y.o. year old patient with an obstructing left distal ureteral stone, acute kidney injury, and atrophic right kidney. After reviewing the management options for treatment, the patient elected to proceed with the above surgical procedure(s). We have discussed the potential benefits and risks of the procedure, side effects of the proposed treatment, the likelihood of the patient achieving the goals of the procedure, and any potential problems that might occur during the procedure or recuperation. Informed consent has been obtained.  Description of procedure:  The patient was taken to the operating room and general anesthesia was induced.  The patient was placed in the dorsal lithotomy position, prepped and draped in the usual sterile fashion, and preoperative antibiotics were administered. A preoperative time-out was performed.   Cystourethroscopy was performed.  The patient's urethra was examined and demonstrated bilobar prostatic hypertrophy with a median lobe. The bladder was then systematically examined in its entirety. There was no evidence for any bladder tumors or other mucosal pathology.  He did have a 2 cm bladder calculus.  Attention then turned to the left  ureteral orifice and a ureteral catheter was used to intubate the ureteral orifice.  Omnipaque contrast was injected through the ureteral catheter and a retrograde pyelogram was performed with findings as dictated above.  He did have J hooking ureters due to his BPH.  I was able to first get a glidewire up the ureter and then advanced the ureteral catheter.  The glidewire was then removed.  A 0.38 sensor guidewire was then advanced up the left ureter into the renal pelvis under fluoroscopic guidance. The 6 Fr semirigid ureteroscope was then advanced into the ureter next to the guidewire and the calculus was identified.   The stone was then fragmented with the 365 micron holmium laser fiber on a setting of 0.6 J and frequency of 6 Hz.   All stones were then removed from the ureter with a zero tip nitinol basket.  Reinspection of the ureter revealed no remaining visible stones or fragments.   The wire was then backloaded through the cystoscope and a ureteral stent was advance over the wire using Seldinger technique.  The stent was positioned appropriately under fluoroscopic and cystoscopic guidance.  The wire was then removed with an adequate stent curl noted in the renal pelvis as well as in the bladder.  The bladder was then emptied and the procedure ended.  The patient appeared to tolerate the procedure well and without complications.  The patient was able to be awakened and transferred to the recovery unit in satisfactory condition.

## 2022-02-06 ENCOUNTER — Encounter (HOSPITAL_COMMUNITY): Payer: Self-pay | Admitting: Urology

## 2022-02-06 ENCOUNTER — Other Ambulatory Visit: Payer: Self-pay | Admitting: Interventional Cardiology

## 2022-02-06 ENCOUNTER — Other Ambulatory Visit (HOSPITAL_COMMUNITY): Payer: Self-pay

## 2022-02-06 DIAGNOSIS — N400 Enlarged prostate without lower urinary tract symptoms: Secondary | ICD-10-CM | POA: Diagnosis not present

## 2022-02-06 DIAGNOSIS — I480 Paroxysmal atrial fibrillation: Secondary | ICD-10-CM

## 2022-02-06 DIAGNOSIS — N179 Acute kidney failure, unspecified: Secondary | ICD-10-CM | POA: Diagnosis not present

## 2022-02-06 DIAGNOSIS — N1330 Unspecified hydronephrosis: Secondary | ICD-10-CM | POA: Diagnosis not present

## 2022-02-06 DIAGNOSIS — Q231 Congenital insufficiency of aortic valve: Secondary | ICD-10-CM

## 2022-02-06 DIAGNOSIS — N201 Calculus of ureter: Secondary | ICD-10-CM | POA: Diagnosis not present

## 2022-02-06 LAB — RENAL FUNCTION PANEL
Albumin: 3 g/dL — ABNORMAL LOW (ref 3.5–5.0)
Anion gap: 6 (ref 5–15)
BUN: 34 mg/dL — ABNORMAL HIGH (ref 8–23)
CO2: 26 mmol/L (ref 22–32)
Calcium: 8.7 mg/dL — ABNORMAL LOW (ref 8.9–10.3)
Chloride: 109 mmol/L (ref 98–111)
Creatinine, Ser: 2.46 mg/dL — ABNORMAL HIGH (ref 0.61–1.24)
GFR, Estimated: 26 mL/min — ABNORMAL LOW (ref >60)
Glucose, Bld: 127 mg/dL — ABNORMAL HIGH (ref 70–99)
Phosphorus: 3.6 mg/dL (ref 2.5–4.6)
Potassium: 4.3 mmol/L (ref 3.5–5.1)
Sodium: 141 mmol/L (ref 135–145)

## 2022-02-06 LAB — CBC WITH DIFFERENTIAL/PLATELET
Abs Immature Granulocytes: 0.04 10*3/uL (ref 0.00–0.07)
Basophils Absolute: 0 10*3/uL (ref 0.0–0.1)
Basophils Relative: 0 %
Eosinophils Absolute: 0 10*3/uL (ref 0.0–0.5)
Eosinophils Relative: 0 %
HCT: 30.4 % — ABNORMAL LOW (ref 39.0–52.0)
Hemoglobin: 10.1 g/dL — ABNORMAL LOW (ref 13.0–17.0)
Immature Granulocytes: 1 %
Lymphocytes Relative: 8 %
Lymphs Abs: 0.5 10*3/uL — ABNORMAL LOW (ref 0.7–4.0)
MCH: 32.7 pg (ref 26.0–34.0)
MCHC: 33.2 g/dL (ref 30.0–36.0)
MCV: 98.4 fL (ref 80.0–100.0)
Monocytes Absolute: 0.3 10*3/uL (ref 0.1–1.0)
Monocytes Relative: 5 %
Neutro Abs: 5.2 10*3/uL (ref 1.7–7.7)
Neutrophils Relative %: 86 %
Platelets: 122 10*3/uL — ABNORMAL LOW (ref 150–400)
RBC: 3.09 MIL/uL — ABNORMAL LOW (ref 4.22–5.81)
RDW: 13.9 % (ref 11.5–15.5)
WBC: 6 10*3/uL (ref 4.0–10.5)
nRBC: 0 % (ref 0.0–0.2)

## 2022-02-06 MED ORDER — AMLODIPINE BESYLATE 5 MG PO TABS: 5.0000 mg | ORAL_TABLET | Freq: Every evening | ORAL | Status: DC

## 2022-02-06 MED ORDER — HYDRALAZINE HCL 50 MG PO TABS: 50.0000 mg | ORAL_TABLET | Freq: Three times a day (TID) | ORAL | 2 refills | Status: DC

## 2022-02-06 MED ORDER — ACETAMINOPHEN 325 MG PO TABS: 650.0000 mg | ORAL_TABLET | Freq: Four times a day (QID) | ORAL | Status: AC | PRN

## 2022-02-06 MED ORDER — HYDRALAZINE HCL 50 MG PO TABS
50.0000 mg | ORAL_TABLET | Freq: Three times a day (TID) | ORAL | 2 refills | Status: DC
  Filled 2022-02-06 (×2): qty 90, 30d supply, fill #0

## 2022-02-06 MED ORDER — AMLODIPINE BESYLATE 5 MG PO TABS
5.0000 mg | ORAL_TABLET | Freq: Every evening | ORAL | 3 refills | Status: DC
  Filled 2022-02-06 (×2): qty 30, 30d supply, fill #0

## 2022-02-06 MED ORDER — AMLODIPINE BESYLATE 5 MG PO TABS: 5.0000 mg | ORAL_TABLET | Freq: Every evening | ORAL | 3 refills | Status: DC

## 2022-02-06 NOTE — Progress Notes (Signed)
Patient ID: Derek Washington, male   DOB: 08/04/1940, 81 y.o.   MRN: [id number]  1 Day Post-Op Subjective: S/P left ureteral stone removal and stent placement yesterday.  Denies pain.  Objective: Vital signs in last 24 hours: Temp:  [97.8 F (36.6 C)-98.5 F (36.9 C)] 98.2 F (36.8 C) (08/22 0357) Pulse Rate:  [60-93] 73 (08/22 0357) Resp:  [14-20] 16 (08/22 0357) BP: (100-162)/(68-89) 132/78 (08/22 0357) SpO2:  [96 %-100 %] 97 % (08/22 0357) Weight:  [103.1 kg] 103.1 kg (08/22 0500)  Intake/Output from previous day: 08/21 0701 - 08/22 0700 In: 1240 [P.O.:240; I.V.:1000] Out: 1150 [Urine:1150] Intake/Output this shift: No intake/output data recorded.  Physical Exam:  General: Alert and oriented Abdomen: No CVAT  Lab Results: Recent Labs    02/04/22 0635 02/05/22 0614 02/06/22 0348  HGB 12.5* 11.3* 10.1*  HCT 35.5* 33.4* 30.4*   BMET Recent Labs    02/05/22 0614 02/06/22 0348  NA 140 141  K 3.6 4.3  CL 107 109  CO2 26 26  GLUCOSE 92 127*  BUN 34* 34*  CREATININE 3.33* 2.46*  CALCIUM 8.9 8.7*     Studies/Results: DG C-Arm 1-60 Min-No Report  Result Date: 02/05/2022 Fluoroscopy was utilized by the requesting physician.  No radiographic interpretation.   DG C-Arm 1-60 Min-No Report  Result Date: 02/05/2022 Fluoroscopy was utilized by the requesting physician.  No radiographic interpretation.   CT RENAL STONE STUDY  Result Date: 02/04/2022 CLINICAL DATA:  Hematuria EXAM: CT ABDOMEN AND PELVIS WITHOUT CONTRAST TECHNIQUE: Multidetector CT imaging of the abdomen and pelvis was performed following the standard protocol without IV contrast. RADIATION DOSE REDUCTION: This exam was performed according to the departmental dose-optimization program which includes automated exposure control, adjustment of the mA and/or kV according to patient size and/or use of iterative reconstruction technique. COMPARISON:  01/30/2022 renal ultrasound 11/09/2020 abdominopelvic CTA  FINDINGS: Lower chest: Bibasilar scarring. Bibasilar pulmonary nodules on the order of 4 mm and less were similar on the prior and can be presumed benign. Cardiomegaly with prior aortic valve repair. Pacer. Right coronary artery calcification. Hepatobiliary: Normal noncontrast appearance of the liver and gallbladder, given mild motion degradation. Pancreas: Fatty replacement especially in the head and uncinate process. No duct dilatation or acute inflammation. Spleen: Normal in size, without focal abnormality. Adrenals/Urinary Tract: Normal adrenal glands. right renal cortical thinning with multiple low-density right greater than left renal lesions which are primarily felt to represent cysts. A 2.0 cm interpolar right renal lesion on 42/3 measures slightly greater than fluid density both today and on the postcontrast exam of 11/09/2020. Decreased in size since that exam, most consistent with a minimally complex cyst . In the absence of clinically indicated signs/symptoms require(s) no independent follow-up. A dominant exophytic left renal 8.4 cm low density lesion is similar in size to on the prior and most consistent with a minimally complex cyst . In the absence of clinically indicated signs/symptoms require(s) no independent follow-up. left renal collecting system calculi of maximally 7 mm. Mild left-sided hydroureter with nonspecific perirenal and Periureteric interstitial thickening. Distal left ureteric stone measures 8 mm on 80/3. Bladder base 11 mm stone on 78/3. Stomach/Bowel: Normal stomach, without wall thickening. Normal terminal ileum. Normal small bowel. Vascular/Lymphatic: Aortic atherosclerosis. No abdominopelvic adenopathy. Reproductive: Mild prostatomegaly. Other: No significant free fluid. No free intraperitoneal air. Small fat containing paraumbilical hernia. Musculoskeletal: Lumbosacral spondylosis. IMPRESSION: 1. Mild left-sided hydroureteronephrosis secondary to a 8 mm distal left ureteric  stone. 2. Bladder base stone. 3.  Left nephrolithiasis. 4. Right renal atrophy. 5. Coronary artery atherosclerosis. 6. Prostatomegaly. 7. Aortic Atherosclerosis (ICD10-I70.0). 8. Mild motion degradation. Electronically Signed   By: Jeronimo Greaves M.D.   On: 02/04/2022 12:11   DG Chest 2 View  Result Date: 02/04/2022 CLINICAL DATA:  Cough EXAM: CHEST - 2 VIEW COMPARISON:  01/30/2022 FINDINGS: Bilateral mild interstitial thickening. No focal consolidation. No pleural effusion or pneumothorax. Stable cardiomegaly. Prior CABG. Dual lead cardiac pacemaker. Right-sided jugular central venous catheter with the tip projecting over the SVC. Prior aortic valve replacement. No acute osseous abnormality. IMPRESSION: 1. Cardiomegaly with mild pulmonary vascular congestion. Electronically Signed   By: Elige Ko M.D.   On: 02/04/2022 11:57    Assessment/Plan: 1) Left ureteral calculus with atrophic right kidney / AKI: S/P left ureteroscopic laser lithotripsy and stone removal 8/21.  Renal function improving again.  Ok for discharge from urologic standpoint once felt to be stable from nephrology's standpoint.  Will arrange outpatient follow up to recheck Cr and consider stent removal vs additional intervention (see below). 2) Left renal calculi / bladder calculus:  We discussed the presence of his non-obstructing left renal calculi and bladder calculus which were not addressed yesterday.  Will discuss whether he wishes to electively pursue treatment of this remaining stone burden or remove his stent and proceed with observation understanding the pros and cons of each of those approaches.   LOS: 7 days   [name] 02/06/2022, 7:35 AM

## 2022-02-06 NOTE — Discharge Summary (Addendum)
Physician Discharge Summary   Patient: Derek Washington MRN: [id number] DOB: September 16, 1940  Admit date:     01/30/2022  Discharge date: 02/06/22  Discharge Physician: Oswald Hillock   PCP: Josetta Huddle, MD   Recommendations at discharge:   Follow-up with urology as outpatient Follow-up nephrology as outpatient  Discharge Diagnoses: Principal Problem:   AKI (acute kidney injury) ([place]) Active Problems:   Gastroesophageal reflux disease   High cholesterol   S/P AVR (aortic valve replacement)   Pacemaker - MDT   BPH (benign prostatic hyperplasia)   Hypertension   Hyperkalemia   Acute metabolic acidosis   Paroxysmal A-fib (H[facility])   Hypokalemia   Left ureteral calculus   Hydroureteronephrosis: Left secondary to 8 mm distal left ureteral calculus  Resolved Problems:   * No resolved hospital problems. *  Hospital Course: 81 year old gentleman history of bicuspid aortic valve status post AVR, status post PPM, hypertension, hyperlipidemia, BPH presented to the ED with abnormal labs and fatigue noted to be in acute renal failure.  Patient noted to have been seen by PCP in July and noted to be on ARB and Norvasc with HCTZ added for better blood pressure control.  Patient noted to have also undergone CT angiogram on 01/22/2022 for follow-up on AVR and aortic valve dilatation with contrast noted.  Patient presented with a week of fatigue, generalized weakness.  Noted to have significant acute kidney injury and sent to the ED.  Patient noted on admission to have a creatinine of 11.61, potassium of 6.1.  Patient admitted for further evaluation and management.  Nephrology consulted.  Assessment and Plan:  1 acute kidney injury (baseline creatinine approximately 1.13 11/2020) -Patient noted on admission with creatinine of 11.39.  Potassium of 6.4  -Likely secondary to ATN/prerenal azotemia in the setting of HCTZ and recent contrast from CT angiogram.  -Renal ultrasound negative for hydronephrosis,  echogenic kidneys bilaterally with right renal cortical thinning compatible with medical renal disease, bilateral renal cysts, 1.4 cm calculus in the bladder, prostatomegaly. -Urinalysis nitrite negative, leukocytes negative, negative for ketones, negative for protein, negative for glucose. -Urine sodium of 96, urine creatinine of 60. -Status post IV fluids with no significant improvement with renal function early on during the hospitalization.   -Patient being followed by nephrology who are recommended intermittent dialysis with HD #1 on 02/01/2022 with improvement with renal function initially and trending back up 10.31 (02/03/2022). -Patient status post HD #2 02/03/2022 with creatinine down to 2.46 -Continue to hold ARB, HCTZ  -Follow-up nephrology as outpatient   2.  Hyperkalemia/hypokalemia -Likely secondary to problem #1. -Patient underwent hemodialysis #1 on 02/01/2022. --Resolved   3.  Metabolic acidosis -Likely secondary to problem #1. -Resolved on bicarb tablets.  -Per nephrology.    4.  Hypertension -Continue current dose of Norvasc 5 mg daily, hydralazine 50 mg 3 times daily -Continue to hold HCTZ, ARB secondary to acute renal failure     5.  History of BPH -Continue finasteride. -Renal ultrasound negative for hydronephrosis on admission however CT renal stone protocol with mild left hydroureteronephrosis secondary to left ureteral calculus. -Follow-up.   6.  Hyperlipidemia -Statin.   7.  History of aortic valve replacement -Stable. -Outpatient follow-up.   8.  Paroxysmal atrial fibrillation -Currently rate controlled. -Eliquis was held in the hospital for hematuria -Discussed with Dr. Louis Meckel, will restart Eliquis 48 hours from procedure -Patient to start taking Eliquis from 02/07/2022.   9.  Obesity -As evidenced by BMI of 31.91 kg/m2 -Lifestyle modification -  Outpatient follow-up with PCP.   9.  Hematuria/secondary to ureteral calculus/mild left  hydroureteronephrosis -Patient noted with hematuria the evening of 02/03/2022-02/04/2022 -Hematuria improved. -Urinalysis with large hemoglobin, trace leukocytes, negative nitrite, rare bacteria, RBC > 50, WBC 0-5. -Patient states history of nephrolithiasis. -Renal ultrasound done early on during the hospitalization negative for hydronephrosis, echogenic kidneys bilaterally with right renal cortical thinning compatible with medical renal disease, bilateral renal cysts.  1.4 cm calculus in the bladder.  Prostamegaly. -Urine cultures with no growth. -CT renal stone protocol with mild left hydroureteronephrosis secondary to 8 mm distal left urethral stone.  -Patient seen in consultation by urology, Dr. Alyson Ingles 02/04/2022 and patient underwentCystoscopy Left ureteroscopy and stone removal Ureteroscopic laser lithotripsy Left ureteral stent placement (6 x 26 - no string) Left retrograde pyelography with interpretation   Told patient to hold Eliquis, till hematuria clears      Consultants: Urology, nephrology Procedures performed:  Disposition: Home Diet recommendation:  Discharge Diet Orders (From admission, onward)     Start     Ordered   02/06/22 0000  Diet - low sodium heart healthy        02/06/22 1615           Regular diet DISCHARGE MEDICATION: Allergies as of 02/06/2022       Reactions   Hydrochlorothiazide Other (See Comments)   fatigue        Medication List     STOP taking these medications    furosemide 20 MG tablet Commonly known as: LASIX   olmesartan 20 MG tablet Commonly known as: BENICAR       TAKE these medications    acetaminophen 325 MG tablet Commonly known as: TYLENOL Take 2 tablets (650 mg total) by mouth every 6 (six) hours as needed for mild pain (or Fever >/= 101).   amLODipine 5 MG tablet Commonly known as: NORVASC Take 1 tablet (5 mg total) by mouth every evening. What changed:  medication strength how much to take    atorvastatin 20 MG tablet Commonly known as: LIPITOR Take 20 mg by mouth every evening.   cholecalciferol 25 MCG (1000 UNIT) tablet Commonly known as: VITAMIN D3 Take 1,000 Units by mouth in the morning.   Eliquis 5 MG Tabs tablet Generic drug: apixaban TAKE 1 TABLET BY MOUTH TWICE  DAILY   finasteride 5 MG tablet Commonly known as: PROSCAR Take 5 mg by mouth in the morning.   Fish Oil 1000 MG Caps Take 1,000 mg by mouth 2 (two) times daily.   hydrALAZINE 50 MG tablet Commonly known as: APRESOLINE Take 1 tablet (50 mg total) by mouth 3 (three) times daily.   multivitamin with minerals Tabs tablet Take 1 tablet by mouth in the morning.        Discharge Exam: Filed Weights   02/04/22 0620 02/05/22 0537 02/06/22 0500  Weight: 106 kg 102.4 kg 103.1 kg   General-appears in no acute distress Heart-S1-S2, regular, no murmur auscultated Lungs-clear to auscultation bilaterally, no wheezing or crackles auscultated Abdomen-soft, nontender, no organomegaly Extremities-no edema in the lower extremities Neuro-alert, oriented x3, no focal deficit noted  Condition at discharge: good  The results of significant diagnostics from this hospitalization (including imaging, microbiology, ancillary and laboratory) are listed below for reference.   Imaging Studies: DG C-Arm 1-60 Min-No Report  Result Date: 02/05/2022 Fluoroscopy was utilized by the requesting physician.  No radiographic interpretation.   DG C-Arm 1-60 Min-No Report  Result Date: 02/05/2022 Fluoroscopy was utilized by the  requesting physician.  No radiographic interpretation.   CT RENAL STONE STUDY  Result Date: 02/04/2022 CLINICAL DATA:  Hematuria EXAM: CT ABDOMEN AND PELVIS WITHOUT CONTRAST TECHNIQUE: Multidetector CT imaging of the abdomen and pelvis was performed following the standard protocol without IV contrast. RADIATION DOSE REDUCTION: This exam was performed according to the departmental dose-optimization  program which includes automated exposure control, adjustment of the mA and/or kV according to patient size and/or use of iterative reconstruction technique. COMPARISON:  01/30/2022 renal ultrasound 11/09/2020 abdominopelvic CTA FINDINGS: Lower chest: Bibasilar scarring. Bibasilar pulmonary nodules on the order of 4 mm and less were similar on the prior and can be presumed benign. Cardiomegaly with prior aortic valve repair. Pacer. Right coronary artery calcification. Hepatobiliary: Normal noncontrast appearance of the liver and gallbladder, given mild motion degradation. Pancreas: Fatty replacement especially in the head and uncinate process. No duct dilatation or acute inflammation. Spleen: Normal in size, without focal abnormality. Adrenals/Urinary Tract: Normal adrenal glands. right renal cortical thinning with multiple low-density right greater than left renal lesions which are primarily felt to represent cysts. A 2.0 cm interpolar right renal lesion on 42/3 measures slightly greater than fluid density both today and on the postcontrast exam of 11/09/2020. Decreased in size since that exam, most consistent with a minimally complex cyst . In the absence of clinically indicated signs/symptoms require(s) no independent follow-up. A dominant exophytic left renal 8.4 cm low density lesion is similar in size to on the prior and most consistent with a minimally complex cyst . In the absence of clinically indicated signs/symptoms require(s) no independent follow-up. left renal collecting system calculi of maximally 7 mm. Mild left-sided hydroureter with nonspecific perirenal and Periureteric interstitial thickening. Distal left ureteric stone measures 8 mm on 80/3. Bladder base 11 mm stone on 78/3. Stomach/Bowel: Normal stomach, without wall thickening. Normal terminal ileum. Normal small bowel. Vascular/Lymphatic: Aortic atherosclerosis. No abdominopelvic adenopathy. Reproductive: Mild prostatomegaly. Other: No  significant free fluid. No free intraperitoneal air. Small fat containing paraumbilical hernia. Musculoskeletal: Lumbosacral spondylosis. IMPRESSION: 1. Mild left-sided hydroureteronephrosis secondary to a 8 mm distal left ureteric stone. 2. Bladder base stone. 3. Left nephrolithiasis. 4. Right renal atrophy. 5. Coronary artery atherosclerosis. 6. Prostatomegaly. 7. Aortic Atherosclerosis (ICD10-I70.0). 8. Mild motion degradation. Electronically Signed   By: Abigail Miyamoto M.D.   On: 02/04/2022 12:11   DG Chest 2 View  Result Date: 02/04/2022 CLINICAL DATA:  Cough EXAM: CHEST - 2 VIEW COMPARISON:  01/30/2022 FINDINGS: Bilateral mild interstitial thickening. No focal consolidation. No pleural effusion or pneumothorax. Stable cardiomegaly. Prior CABG. Dual lead cardiac pacemaker. Right-sided jugular central venous catheter with the tip projecting over the SVC. Prior aortic valve replacement. No acute osseous abnormality. IMPRESSION: 1. Cardiomegaly with mild pulmonary vascular congestion. Electronically Signed   By: Kathreen Devoid M.D.   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 EXAM: NON-TUNNELED CENTRAL VENOUS HEMODIALYSIS CATHETER PLACEMENT WITH ULTRASOUND AND FLUOROSCOPIC GUIDANCE COMPARISON:  None Available. MEDICATIONS: None FLUOROSCOPY TIME:  0.2 minutes, 1 mGy COMPLICATIONS: None immediate. PROCEDURE: Informed written consent was obtained from the patient after a discussion of the risks, benefits, and alternatives to treatment. Questions regarding the procedure were encouraged and answered. The right neck and chest were prepped with chlorhexidine in a sterile fashion, and a sterile drape was applied covering the operative field. Maximum barrier sterile technique with sterile gowns and gloves were  used for the procedure. A timeout was performed prior to the initiation  of the procedure. After the overlying soft tissues were anesthetized, a small venotomy incision was created and a micropuncture kit was utilized to access the internal jugular vein. Real-time ultrasound guidance was utilized for vascular access including the acquisition of a permanent ultrasound image documenting patency of the accessed vessel. The microwire was utilized to measure appropriate catheter length. A stiff glidewire was advanced to the level of the IVC. Under fluoroscopic guidance, the venotomy was serially dilated, ultimately allowing placement of a 20 cm temporary Trialysis catheter with tip ultimately terminating within the superior aspect of the right atrium. Final catheter positioning was confirmed and documented with a spot radiographic image. The catheter aspirates and flushes normally. The catheter was flushed with appropriate volume heparin dwells. The catheter exit site was secured with a 0-Prolene retention suture. A dressing was placed. The patient tolerated the procedure well without immediate post procedural complication. IMPRESSION: Successful placement of a right internal jugular approach 20 cm temporary dialysis catheter with tip terminating with in the superior aspect of the right atrium. The catheter is ready for immediate use. PLAN: This catheter may be converted to a tunneled dialysis catheter at a later date as indicated. Ruthann Cancer, MD [facility] Electronically Signed   By: Ruthann Cancer M.D.   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 EXAM: NON-TUNNELED CENTRAL VENOUS HEMODIALYSIS CATHETER PLACEMENT WITH ULTRASOUND AND FLUOROSCOPIC GUIDANCE COMPARISON:  None Available. MEDICATIONS: None FLUOROSCOPY TIME:  0.2 minutes, 1 mGy COMPLICATIONS: None immediate. PROCEDURE: Informed  written consent was obtained from the patient after a discussion of the risks, benefits, and alternatives to treatment. Questions regarding the procedure were encouraged and answered. The right neck and chest were prepped with chlorhexidine in a sterile fashion, and a sterile drape was applied covering the operative field. Maximum barrier sterile technique with sterile gowns and gloves were used for the procedure. A timeout was performed prior to the initiation of the procedure. After the overlying soft tissues were anesthetized, a small venotomy incision was created and a micropuncture kit was utilized to access the internal jugular vein. Real-time ultrasound guidance was utilized for vascular access including the acquisition of a permanent ultrasound image documenting patency of the accessed vessel. The microwire was utilized to measure appropriate catheter length. A stiff glidewire was advanced to the level of the IVC. Under fluoroscopic guidance, the venotomy was serially dilated, ultimately allowing placement of a 20 cm temporary Trialysis catheter with tip ultimately terminating within the superior aspect of the right atrium. Final catheter positioning was confirmed and documented with a spot radiographic image. The catheter aspirates and flushes normally. The catheter was flushed with appropriate volume heparin dwells. The catheter exit site was secured with a 0-Prolene retention suture. A dressing was placed. The patient tolerated the procedure well without immediate post procedural complication. IMPRESSION: Successful placement of a right internal jugular approach 20 cm temporary dialysis catheter with tip terminating with in the superior aspect of the right atrium. The catheter is ready for immediate use. PLAN: This catheter may be converted to a tunneled dialysis catheter at a later date as indicated. Ruthann Cancer, MD [facility] Radiology Electronically  Signed   By: Ruthann Cancer M.D.   On: 02/01/2022 13:41   US RENAL  Result Date: 01/30/2022 CLINICAL DATA:  Acute kidney injury. EXAM: RENAL / URINARY TRACT ULTRASOUND COMPLETE COMPARISON:  CT abdomen and pelvis 11/09/2020 FINDINGS: Right Kidney: Renal measurements: 11.1 x 5.0 x 5.8 cm = volume: 170 mL. Increased echogenicity. Cortical thinning. No hydronephrosis. Two cysts are identified. One in the superior pole measures 2.6 x 2.7 x 2.7 cm. One in the inferior pole measures 3.5 x 3.2 x 3.7 cm Left Kidney: Renal measurements: 13.0 x 7.4 x 6.8 cm = volume: 338 mL. Increased echogenicity. No hydronephrosis. Two cysts are identified in the left kidney. Superior pole cyst measures 8.3 x 6.5 x 4.9 cm. Inferior pole cyst measures 4.0 x 2.9 x 3.1 cm. Bladder: 1.4 cm echogenic focus is seen in the dependent portion of the bladder with shadowing. Bladder is otherwise within normal limits. Other: Prostate gland appears enlarged measuring 5.2 x 4.5 x 3.8 cm IMPRESSION: 1. No hydronephrosis. 2. Echogenic kidneys bilaterally with right renal cortical thinning compatible with medical renal disease. 3. Bilateral renal cysts. 4. 1.4 cm calculus in the bladder. 5. Prostatomegaly. Electronically Signed   By: Ronney Asters M.D.   On: 01/30/2022 16:59   DG Chest 2 View  Result Date: 01/30/2022 CLINICAL DATA:  Shortness of breath. EXAM: CHEST - 2 VIEW COMPARISON:  Chest radiograph 01/18/2021 and chest CT 01/22/2022 FINDINGS: Stable appearance of the left chest dual chamber cardiac pacemaker. Patient has had a median sternotomy and aortic valve replacement. Heart size is stable. Atherosclerotic calcifications at the aortic arch. Both lungs are clear without airspace disease or pulmonary edema. No large pleural effusions. Bridging osteophytes in the thoracic spine. IMPRESSION: No active cardiopulmonary disease. Electronically Signed   By: Markus Daft M.D.   On: 01/30/2022 12:51   CT ANGIO CHEST AORTA W/CM & OR WO/CM  Result  Date: 01/22/2022 CLINICAL DATA:  Aortic aneurysm, known or suspected follow-up study EXAM: CT ANGIOGRAPHY CHEST WITH CONTRAST TECHNIQUE: Multidetector CT imaging of the chest was performed using the standard protocol during bolus administration of intravenous contrast. Multiplanar CT image reconstructions and MIPs were obtained to evaluate the vascular anatomy. RADIATION DOSE REDUCTION: This exam was performed according to the departmental dose-optimization program which includes automated exposure control, adjustment of the mA and/or kV according to patient size and/or use of iterative reconstruction technique. CONTRAST:  2m OMNIPAQUE IOHEXOL 350 MG/ML SOLN COMPARISON:  Nov 09, 2020 FINDINGS: Cardiovascular: Mild cardiomegaly. There has been interval aortic valve replacement. Ascending thoracic aorta measures 4.2 cm without significant interval change. Mild to moderate atheromatous calcifications of the arch of the aorta and coronary arteries stable. No pericardial effusion. Mediastinum/Nodes: No enlarged mediastinal, hilar, or axillary lymph nodes. Thyroid gland, trachea, and esophagus demonstrate no significant findings. Lungs/Pleura: Lungs are clear except for a thin discoid atelectasis or scar in the right lower lung. No pleural effusion or pneumothorax. Upper Abdomen: 8.8 cm cyst which in the previous study arose from the left kidney without significant interval change. Musculoskeletal: Moderate thoracic spondylosis. Review of the MIP images confirms the above findings. IMPRESSION: 1. Ascending thoracic aorta measures 4.2 cm without significant interval change. Mild cardiomegaly. There has been interval aortic valve replacement. No pericardial effusion. Recommend annual imaging followup by CTA or MRA. This recommendation follows 2010 ACCF/AHA/AATS/ACR/ASA/SCA/SCAI/SIR/STS/SVM Guidelines for the Diagnosis and Management of Patients with Thoracic Aortic Disease. Circulation. 2010; 121:: S128-S081 Aortic  aneurysm NOS (ICD10-I71.9) 2.  8.8 cm left renal cyst without significant interval change. Electronically Signed   By: AFrazier RichardsM.D.   On: 01/22/2022  10:22   CUP PACEART INCLINIC DEVICE CHECK  Result Date: 01/10/2022 Pacemaker check in clinic. Normal device function. Thresholds, sensing, impedances consistent with previous measurements. Device programmed to maximize longevity. 3 minutes of atrial arrhythmia on January 04, 2022 +Eliquis.  No high ventricular rates noted.  Device programmed at appropriate safety margins. Histogram distribution appropriate for patient activity level. Device programmed to optimize intrinsic conduction. Estimated longevity 11.7 years. Patient enrolled in remote follow-up. Patient education completed.Myrtie Hawk, BSN, RN   Microbiology: Results for orders placed or performed during the hospital encounter of 01/30/22  Urine Culture     Status: None   Collection Time: 02/04/22  8:49 AM   Specimen: Urine, Clean Catch  Result Value Ref Range Status   Specimen Description URINE, CLEAN CATCH  Final   Special Requests NONE  Final   Culture   Final    NO GROWTH Performed at [facility], [place] [address] [place], [place] [address]    Report Status 02/05/2022 FINAL  Final    Labs: CBC: Recent Labs  Lab 02/01/22 0325 02/01/22 1412 02/04/22 0635 02/05/22 0614 02/06/22 0348  WBC 5.2 5.6 9.2 8.3 6.0  NEUTROABS  --   --   --  5.7 5.2  HGB 10.6* 11.7* 12.5* 11.3* 10.1*  HCT 30.2* 33.1* 35.5* 33.4* 30.4*  MCV 94.4 93.8 93.7 96.5 98.4  PLT 122* 123* 138* 129* 086*   Basic Metabolic Panel: Recent Labs  Lab 01/31/22 0130 01/31/22 1248 02/01/22 0325 02/01/22 1412 02/02/22 0342 02/03/22 0220 02/04/22 0635 02/05/22 0614 02/06/22 0348  NA 138 139 138 138 136 139 138 140 141  K 5.7* 6.1* 5.3* 5.1 3.7 4.6 3.3* 3.6 4.3  CL 108 109 108 106 103 101 103 107 109  CO2 16* 16* 17* 18* _0 GLUCOSE 95 89 107* 98 87 87 106* 92 127*  BUN 117* 116* 118*  117* 80* 87* 29* 34* 34*  CREATININE 11.44* 11.51* 12.20* 12.67* 9.29* 10.31* 2.47* 3.33* 2.46*  CALCIUM 9.4 9.1 8.7* 9.0 8.5* 8.7* 9.3 8.9 8.7*  MG 2.2  --  2.3  --   --   --   --   --   --   PHOS 7.8* 7.6* 8.6* 8.4*  --   --   --  3.9 3.6   Liver Function Tests: Recent Labs  Lab 01/31/22 0130 01/31/22 1248 02/01/22 0325 02/01/22 1412 02/05/22 0614 02/06/22 0348  AST 13*  --   --   --   --   --   ALT 24  --   --   --   --   --   ALKPHOS 43  --   --   --   --   --   BILITOT 1.3*  --   --   --   --   --   PROT 6.2*  --   --   --   --   --   ALBUMIN 3.6 3.4* 2.9* 3.4* 3.4* 3.0*   CBG: No results for input(s): "GLUCAP" in the last 168 hours.  Discharge time spent: greater than 30 minutes.  Signed: Oswald Hillock, MD [facility] 02/06/2022

## 2022-02-06 NOTE — Progress Notes (Addendum)
[facility] Progress Note   Assessment/ Plan:    AKI: baseline cr was 1.13 as of 11/2020.  Presented at  7 in the setting of recent addition of HCTZ (July 17th per records), stone, and then CTA with contrast.               - no real symptoms for GN             - suspect stone,  the HCTZ and then th             - hematuria likely 2/2 stone             - hold ARB and HCTZ and NO diuretics at this time             - s/p IVFs             - s/p HD #1 8/17  - HD #2 8/19  - UOP stable and SCr ok, K ok. Will have IV team remove HD cath  - ok for dc home, we will call him to set up [facility] f/u appt in 3-4 wks  2.  Hyperkalemia- improved  3.  Metabolic acidosis - resolved   4.  HTN:             - amlodipine 5 mg qam and hydralazine 50 tid to continue at dc, pt to hold dose if SBP is < 120 for both meds please   5.  Dispo: OK for dc  Subjective:    SCr stuck mid 2's, suspect a bit dry and BP's too well corrected   Objective:   BP 112/62 (BP Location: Right Arm)   Pulse 85   Temp 98 F (36.7 C) (Oral)   Resp 14   Ht 6' (1.829 m)   Wt 103.1 kg   SpO2 96%   BMI 30.83 kg/m   Intake/Output Summary (Last 24 hours) at 02/06/2022 1553 Last data filed at 02/06/2022 1453 Gross per 24 hour  Intake 240 ml  Output 1400 ml  Net -1160 ml    Weight change: 0.723 kg  Physical Exam: GEN NAD, sitting in bed  HEENT EOMI PERRL NECK no JVD PULM clear CV RRR crisp S2 ABD soft, nondistended EXT trace LE edema NEURO AAO x 3 nonfocal SKIN no rashes MSK no effusions R IJ Temp HD cath  Imaging: DG C-Arm 1-60 Min-No Report  Result Date: 02/05/2022 Fluoroscopy was utilized by the requesting physician.  No radiographic interpretation.   DG C-Arm 1-60 Min-No Report  Result Date: 02/05/2022 Fluoroscopy was utilized by the requesting physician.  No radiographic interpretation.    Labs: BMET Recent Labs  Lab 01/31/22 0130 01/31/22 1248 02/01/22 0325 02/01/22 1412  02/02/22 0342 02/03/22 0220 02/04/22 0635 02/05/22 0614 02/06/22 0348  NA 138 139 138 138 136 139 138 140 141  K 5.7* 6.1* 5.3* 5.1 3.7 4.6 3.3* 3.6 4.3  CL 108 109 108 106 103 101 103 107 109  CO2 16* 16* 17* 18* 22 22 23 26 26   GLUCOSE 95 89 107* 98 87 87 106* 92 127*  BUN 117* 116* 118* 117* 80* 87* 29* 34* 34*  CREATININE 11.44* 11.51* 12.20* 12.67* 9.29* 10.31* 2.47* 3.33* 2.46*  CALCIUM 9.4 9.1 8.7* 9.0 8.5* 8.7* 9.3 8.9 8.7*  PHOS 7.8* 7.6* 8.6* 8.4*  --   --   --  3.9 3.6    CBC Recent Labs  Lab 02/01/22 1412 02/04/22 02/06/22 02/05/22 02/07/22 02/06/22 0348  WBC 5.6 9.2 8.3 6.0  NEUTROABS  --   --  5.7 5.2  HGB 11.7* 12.5* 11.3* 10.1*  HCT 33.1* 35.5* 33.4* 30.4*  MCV 93.8 93.7 96.5 98.4  PLT 123* 138* 129* 122*     Medications:     amLODipine  5 mg Oral QPM   atorvastatin  20 mg Oral QPM   Chlorhexidine Gluconate Cloth  6 each Topical Q0600   cholecalciferol  1,000 Units Oral q AM   finasteride  5 mg Oral q AM   guaiFENesin  1,200 mg Oral BID   hydrALAZINE  50 mg Oral Q8H   sodium chloride flush  10-40 mL Intracatheter Q12H   Maree Krabbe, MD  02/06/2022, 3:53 PM

## 2022-02-06 NOTE — Plan of Care (Signed)

## 2022-02-06 NOTE — Plan of Care (Signed)
  Problem: Education: Goal: Knowledge of General Education information will improve Description: Including pain rating scale, medication(s)/side effects and non-pharmacologic comfort measures 02/06/2022 1737 by Marlou Sa, RN Outcome: Completed/Met 02/06/2022 1336 by Marlou Sa, RN Outcome: Progressing   Problem: Health Behavior/Discharge Planning: Goal: Ability to manage health-related needs will improve 02/06/2022 1737 by Marlou Sa, RN Outcome: Completed/Met 02/06/2022 1336 by Marlou Sa, RN Outcome: Progressing   Problem: Clinical Measurements: Goal: Ability to maintain clinical measurements within normal limits will improve 02/06/2022 1737 by Marlou Sa, RN Outcome: Completed/Met 02/06/2022 1336 by Marlou Sa, RN Outcome: Progressing Goal: Will remain free from infection 02/06/2022 1737 by Marlou Sa, RN Outcome: Completed/Met 02/06/2022 1336 by Marlou Sa, RN Outcome: Progressing Goal: Diagnostic test results will improve Outcome: Completed/Met Goal: Respiratory complications will improve Outcome: Completed/Met Goal: Cardiovascular complication will be avoided 02/06/2022 1737 by Marlou Sa, RN Outcome: Completed/Met 02/06/2022 1336 by Marlou Sa, RN Outcome: Progressing   Problem: Activity: Goal: Risk for activity intolerance will decrease Outcome: Completed/Met   Problem: Nutrition: Goal: Adequate nutrition will be maintained Outcome: Completed/Met   Problem: Coping: Goal: Level of anxiety will decrease Outcome: Completed/Met   Problem: Elimination: Goal: Will not experience complications related to bowel motility Outcome: Completed/Met Goal: Will not experience complications related to urinary retention Outcome: Completed/Met   Problem: Pain Managment: Goal: General experience of comfort will improve Outcome: Completed/Met   Problem: Safety: Goal: Ability to remain free from injury will improve Outcome: Completed/Met   Problem: Skin  Integrity: Goal: Risk for impaired skin integrity will decrease Outcome: Completed/Met

## 2022-02-06 NOTE — Care Management Important Message (Signed)
Important Message  Patient Details IM Letter placed in Patients room Name: Derek Washington MRN: [id number] Date of Birth: 1940/08/26   Medicare Important Message Given:  Yes     Caren Macadam 02/06/2022, 2:10 PM

## 2022-02-06 NOTE — Telephone Encounter (Signed)
Eliquis 5mg  refill request received. Patient is 81 years old, weight-103.1kg, Crea-2.46 on 02/06/2022, Diagnosis-Afib, and last seen by Dr. 02/08/2022 on 01/10/2022. Pt is currently admitted with AKI and eliquis is on hold. Will monitor  since pt will need a dose reduction as well.

## 2022-02-06 NOTE — Progress Notes (Signed)
Mobility Specialist - Progress Note     02/06/22 1500  Mobility  Activity Ambulated independently in hallway  Range of Motion/Exercises Active  Level of Assistance Independent  Distance Ambulated (ft) 700 ft  Activity Response Tolerated well  $Mobility charge 1 Mobility   Pt was found in bed and agreeable to mobilize. Had no complaints and returned to bed with all necessitates in reach.  Billey Chang Mobility Specialist

## 2022-02-06 NOTE — Discharge Instructions (Signed)
Hold eliquis till blood in urine clears up or call Dr Konrad Dolores office tomorrow

## 2022-02-06 NOTE — Anesthesia Postprocedure Evaluation (Signed)
Anesthesia Post Note  Patient: Environmental consultant  Procedure(s) Performed: CYSTOSCOPY WITH LEFT URERETAL STENT PLACEMENT, RETROGRADE PYELOGRAM, URETEROSCOPY WITH STONE EXTRACTION, LASER LITHOTRIPSY (Left: Ureter)     Patient location during evaluation: PACU Anesthesia Type: General Level of consciousness: sedated and patient cooperative Pain management: pain level controlled Vital Signs Assessment: post-procedure vital signs reviewed and stable Respiratory status: spontaneous breathing Cardiovascular status: stable Anesthetic complications: no   No notable events documented.  Last Vitals:  Vitals:   02/05/22 2354 02/06/22 0357  BP: 100/68 132/78  Pulse: 71 73  Resp: 18 16  Temp: 36.9 C 36.8 C  SpO2: 96% 97%    Last Pain:  Vitals:   02/05/22 2241  TempSrc:   PainSc: 4                  Lewie Loron

## 2022-02-08 NOTE — Telephone Encounter (Addendum)
Indication: Afib  Last office visit: 01/10/22 Graciela Husbands)  Scr: 2.46 (02/06/22) Age: 81 Weight: 103.1kg  - Per discharge summary on 02/06/22 :  Paroxysmal atrial fibrillation -Currently rate controlled. -Eliquis was held in the hospital for hematuria -Discussed with Dr. Berniece Salines, will restart Eliquis 48 hours from procedure -Patient to start taking Eliquis from 02/07/2022.  - Per Phillips Hay, PharmD on 02/08/22:  Let's drop him to 2.5 mg dose for 90 days to give his kidneys time to figure out where they're going to stabilize.  He's seeing Dr. Eldridge Dace in early November - we can ask them to repeat BMET, and if back below 1.5, we'll increase him back to 5 mg. I'll add a note to the OV to get labs.  Called pt and explained the Eliquis dose change. Pt stated he still has atleast a 30 day supply left of 5mg  tablets. Instructed pt to split 5mg  tablet in half until he runs out and a New prescription will be sent to requested pharmacy.   Lab orders placed and lab appt made for 04/24/22, when pt sees Dr .   Appropriate dose and refill sent to pharmacy.

## 2022-02-14 ENCOUNTER — Encounter: Payer: Self-pay | Admitting: Interventional Cardiology

## 2022-02-14 NOTE — Telephone Encounter (Signed)
I spoke with patient.  He reports shortness of breath when doing his AM walk the last couple of days.  Improves with rest.  Also had trouble sleeping 2 nights ago due to shortness of breath. Slept OK last night.  No swelling. Weight last 2 days 221 lbs. No cough. No congestion.  Appointment made for patient to see Eligha Bridegroom, NP tomorrow at 10:55.  ED precautions reviewed with patient.

## 2022-02-15 ENCOUNTER — Ambulatory Visit: Payer: Medicare Other | Attending: Nurse Practitioner | Admitting: Nurse Practitioner

## 2022-02-15 ENCOUNTER — Encounter: Payer: Self-pay | Admitting: Nurse Practitioner

## 2022-02-15 VITALS — BP 140/72 | HR 88 | Ht 72.0 in | Wt 226.4 lb

## 2022-02-15 DIAGNOSIS — I712 Thoracic aortic aneurysm, without rupture, unspecified: Secondary | ICD-10-CM | POA: Diagnosis not present

## 2022-02-15 DIAGNOSIS — I442 Atrioventricular block, complete: Secondary | ICD-10-CM

## 2022-02-15 DIAGNOSIS — I10 Essential (primary) hypertension: Secondary | ICD-10-CM

## 2022-02-15 DIAGNOSIS — Z952 Presence of prosthetic heart valve: Secondary | ICD-10-CM | POA: Diagnosis not present

## 2022-02-15 DIAGNOSIS — R0609 Other forms of dyspnea: Secondary | ICD-10-CM | POA: Diagnosis not present

## 2022-02-15 NOTE — Progress Notes (Signed)
Cardiology Office Note:    Date:  02/15/2022   ID:  Derek Washington, DOB 1940-12-27, MRN [id number]  PCP:  Derek Noble, MD   [facility] Providers Cardiologist:  Derek Muss, MD     Referring MD: Derek Noble, MD   Chief Complaint: shortness of breath  History of Present Illness:    Derek Washington is a very pleasant 81 y.o. male with a hx of bicuspid aortic valve, severe aortic stenosis s/p AVR, nonobstructive CAD, HTN, HLD, and thoracic aortic aneurysm.   Moved to [place] from [place] and initially established care with our group in 2020.  He reported shortness of breath in 2022.  Echo showed aortic valve mean gradient increased to 36 mm, aortic root was 44 mm.  Had some near syncopal events at that time.  He underwent cardiac catheterization and subsequently underwent AVR using 25 mm Edwards Inspiris pericardial valve on 12/01/2020 by Dr. Laneta Washington. 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 He underwent PPM implant 6 days postop. Additionally there was a fib and flutter postop. 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  He was placed on Eliquis for stroke prevention, CHA2DS2-VASc  His last office visit was 01/10/2022 with Dr. Graciela Washington. No changes to PPM were made and he was advised to return in 1 year for follow-up.  Last seen by Dr. Eldridge Washington on 10/17/21. CTA revealed stable ascending aortic aneursym at 4.2 cm, stable in comparison with previous imaging. Plan to repeat in 1 year. No additional changes were made to treatment plan and follow-up in 6 months was recommended.  Admission 8/15-8/22/23 for AKI after having lab work with his PCP due to fatigue, generalized weakness.  Significant acute kidney injury with creatinine of 11.61 and potassium of 6.1 upon arrival to hospital.  Additionally was found to have kidney stones.   Discharge creatinine was 2.46.  He contacted our office on 02/14/2022 to report shortness of breath when walking the previous few days.  Symptoms improved with rest.  Also had trouble sleeping 2 nights prior due to shortness of breath.  No swelling, weight stable, congestion.  He had recent hospitalization for kidney injury 2/2 CT contrast. Creatinine 2.46 at discharge. He was scheduled for office visit.  Today, he is here with his wife for evaluation of shortness of breath. Reports that over the past 2 days, he has noticed shortness of breath going up 15 steps at home and on his routine walk with his wife. Feels like he cannot get a good breath. Once he notices it is difficult to take a breath, makes him feel more anxious. States he is frequently worried about something being wrong with him. Has been through a lot of medical procedures over the last 14 months. Did not feel this way during or after hospitalization. He denies chest pain, lower extremity edema, fatigue, palpitations, melena, hematuria, hemoptysis, diaphoresis, weakness, presyncope, syncope, orthopnea, and PND.  Past Medical History:  Diagnosis Date   Bicuspid aortic valve    LAST ECHOCARDIOGRAM APPROXIMATELY 2016? MILD AORTIC REGURG   BPH (benign prostatic hyperplasia)    Hard of hearing    Heart murmur    High cholesterol    Hydrocele    Hypertension    Insomnia    Low back pain    RIGHT   Mild obesity    Nocturia  Presence of permanent cardiac pacemaker    Right elbow tendonitis    Tinnitus     Past Surgical History:  Procedure Laterality Date   AORTIC VALVE REPLACEMENT N/A 12/01/2020   Procedure: AORTIC VALVE REPLACEMENT (AVR) USING INSPIRIS AORTIC VALVE;  Surgeon: Derek Borne, MD;  Location: [facility] OR;  Service: Open Heart Surgery;  Laterality: N/A;   CYSTOSCOPY WITH URETEROSCOPY, STONE BASKETRY AND STENT PLACEMENT Left 02/05/2022   Procedure: CYSTOSCOPY WITH LEFT URERETAL STENT PLACEMENT, RETROGRADE PYELOGRAM,  URETEROSCOPY WITH STONE EXTRACTION, LASER LITHOTRIPSY;  Surgeon: Derek Purpura, MD;  Location: [facility] ORS;  Service: Urology;  Laterality: Left;   HYDROCELE EXCISION  2017   DR. [name] 2017   IR FLUORO GUIDE CV LINE RIGHT  02/01/2022   IR US GUIDE VASC ACCESS RIGHT  02/01/2022   JOINT REPLACEMENT     left total knee replacement      2007    PACEMAKER IMPLANT N/A 12/07/2020   Procedure: PACEMAKER IMPLANT;  Surgeon: Derek Salvia, MD;  [facility] CV LAB;  Service: Cardiovascular;  Laterality: N/A;   right knee total replacement      2011   RIGHT/LEFT HEART CATH AND CORONARY ANGIOGRAPHY N/A 11/03/2020   Procedure: RIGHT/LEFT HEART CATH AND CORONARY ANGIOGRAPHY;  Surgeon: Derek Gess, MD;  Location: [facility] INVASIVE CV LAB;  Service: Cardiovascular;  Laterality: N/A;   SCROTAL EXPLORATION N/A 12/26/2015   Procedure: SCROTUM EXPLORATION;  Surgeon: Derek Purpura, MD;  Location: [facility] ORS;  Service: Urology;  Laterality: N/A;  EXCISION OF LEFT EPIDIDYMAL CYST/SPERMATOCELE   TEE WITHOUT CARDIOVERSION N/A 12/01/2020   Procedure: TRANSESOPHAGEAL ECHOCARDIOGRAM (TEE);  Surgeon: Derek Borne, MD;  Location[facility];  Service: Open Heart Surgery;  Laterality: N/A;   TONSILLECTOMY     VASECTOMY     1968    Current Medications: Current Meds  Medication Sig   acetaminophen (TYLENOL) 325 MG tablet Take 2 tablets (650 mg total) by mouth every 6 (six) hours as needed for mild pain (or Fever >/= 101).   amLODipine (NORVASC) 5 MG tablet Take 1 tablet (5 mg total) by mouth every evening.   apixaban (ELIQUIS) 2.5 MG TABS tablet Take 1 tablet (2.5 mg total) by mouth 2 (two) times daily.   atorvastatin (LIPITOR) 20 MG tablet Take 20 mg by mouth every evening.   cholecalciferol (VITAMIN D) 25 MCG (1000 UNIT) tablet Take 1,000 Units by mouth in the morning.   finasteride (PROSCAR) 5 MG tablet Take 5 mg by mouth in the morning.   hydrALAZINE (APRESOLINE) 50 MG tablet Take 1 tablet (50 mg total) by  mouth 3 (three) times daily.   Multiple Vitamin (MULTIVITAMIN WITH MINERALS) TABS tablet Take 1 tablet by mouth in the morning.   Omega-3 Fatty Acids (FISH OIL) 1000 MG CAPS Take 1,000 mg by mouth 2 (two) times daily.     Allergies:   Hydrochlorothiazide   Social History   Socioeconomic History   Marital status: Married    Spouse name: Not on file   Number of children: 1   Years of education: Not on file   Highest education level: Not on file  Occupational History   Occupation: RETIRED  Tobacco Use   Smoking status: Never   Smokeless tobacco: Never  Vaping Use   Vaping Use: Never used  Substance and Sexual Activity   Alcohol use: Yes    Comment: occas    Drug use: No   Sexual activity: Not on file  Other Topics Concern   Not on file  Social History Narrative   Not on file   Social Determinants of Health   Financial Resource Strain: Not on file  Food Insecurity: Not on file  Transportation Needs: Not on file  Physical Activity: Not on file  Stress: Not on file  Social Connections: Not on file     Family History: The patient's family history includes COPD (age of onset: 54) in his father; Other (age of onset: 66) in his mother and sister.  ROS:   Please see the history of present illness.    + SOB All other systems reviewed and are negative.  Labs/Other Studies Reviewed:    The following studies were reviewed today:  CTA Chest Aorta 01/22/22  IMPRESSION: 1. Ascending thoracic aorta measures 4.2 cm without significant interval change. Mild cardiomegaly. There has been interval aortic valve replacement. No pericardial effusion.  Echo 01/16/21   1. There is a 26 mm Inspiris valve present in the aortic position Aortic  valve regurgitation is not visualized. Aortic valve mean gradient measures  12.0 mmHg. Aortic valve Vmax measures 2.33 m/s. Aortic valve acceleration  time measures 71 msec.DVI 0.45.   Suggestive of stable prosthetic function.   2. Left  ventricular ejection fraction, by estimation, is 55 to 60%. The  left ventricular internal cavity size was normal in size. There is mild  concentric left ventricular hypertrophy of the basal-septal segment.  Abnormal (paradoxical) septal motion  consistent with post-operative status. Left ventricular diastolic  parameters are consistent with Grade I diastolic dysfunction (impaired  relaxation).   3. Right ventricular systolic function is normal. The right ventricular  size is normal.   4. Aortic dilatation noted. There is mild dilatation of the ascending  aorta, measuring 42 mm.   5. The mitral valve is normal in structure. Trivial mitral valve  regurgitation. No evidence of mitral stenosis.   Comparison(s): A prior study was performed on 10/27/2020. Resolution of  aortic regurgitation and stenosis.   R/LHC 11/03/20  RPAV lesion is 50% stenosed. Hemodynamic findings consistent with aortic valve stenosis.  IMPRESSION: Mr. Harty has minimal CAD with at most a 50% lesion in his PLA branch.  I suspect his symptoms are related to his bicuspid aortic valve stenosis/regurgitation which will be addressed by Dr. Eldridge Washington as an outpatient.  The sheath was removed and a TR band was placed on the right wrist to achieve patent hemostasis.  The patient left lab in stable condition.     Recent Labs: 01/30/2022: B Natriuretic Peptide 478.3 01/31/2022: ALT 24 02/01/2022: Magnesium 2.3 02/06/2022: BUN 34; Creatinine, Ser 2.46; Hemoglobin 10.1; Platelets 122; Potassium 4.3; Sodium 141  Recent Lipid Panel No results found for: "CHOL", "TRIG", "HDL", "CHOLHDL", "VLDL", "LDLCALC", "LDLDIRECT"   Risk Assessment/Calculations:    CHA2DS2-VASc Score = 3  This indicates a 3.2% annual risk of stroke. The patient's score is based upon: CHF History: 0 HTN History: 1 Diabetes History: 0 Stroke History: 0 Vascular Disease History: 0 Age Score: 2 Gender Score: 0    Physical Exam:    VS:  BP (!) 140/72    Pulse 88   Ht 6' (1.829 m)   Wt 226 lb 6.4 oz (102.7 kg)   SpO2 96%   BMI 30.71 kg/m     Wt Readings from Last 3 Encounters:  02/15/22 226 lb 6.4 oz (102.7 kg)  02/06/22 227 lb 4.7 oz (103.1 kg)  01/10/22 224 lb 3.2 oz (101.7 kg)  GEN:  Well nourished, well developed in no acute distress HEENT: Normal NECK: No JVD; No carotid bruits CARDIAC: RRR, 3/6 holosystolic murmur. No rubs, gallops RESPIRATORY:  Clear to auscultation without rales, wheezing or rhonchi  ABDOMEN: Soft, non-tender, non-distended MUSCULOSKELETAL:  No edema; No deformity. 2+ pedal pulses, equal bilaterally SKIN: Warm and dry NEUROLOGIC:  Alert and oriented x 3 PSYCHIATRIC:  Normal affect   EKG:  EKG is not ordered today.  The ekg ordered today demonstrates    Diagnoses:    1. DOE (dyspnea on exertion)   2. Thoracic aortic aneurysm without rupture, unspecified part (HCC)   3. Heart block AV complete (HCC)   4. S/P AVR (aortic valve replacement)   5. Essential hypertension    Assessment and Plan:     Dyspnea on exertion: Increased DOE for the past 2 days with activities that he performs on a routine basis without symptoms previously. No chest pain, edema, orthopnea, PND. States on the first night when symptoms appeared, he had difficulty getting comfortable in bed.  States he slept better last night.  Suspect there is a component of anxiety that is exacerbating this.  We will get echocardiogram to evaluate for structural heart disease.  As noted below he has a history of AVR. Will recheck CBC today to ensure blood counts continue to improve. Has some hematuria due to kidney stones. Will f/u with Dr. Laverle Patter, urology, next week.   Aortic stenosis s/p AVR: History of bicuspid aortic valve he underwent AVR with pericardial valve on 12/01/2020. Most recent echocardiogram showed normal valve function. No indication of worsening valve function on exam today.  As noted above, we are getting echocardiogram to  evaluate for structural heart disease and will assess valve function as well.  Thoracic aortic aneurysm: 4.2 cm on CTA 01/22/22. AKI following CT with significant increase in creatinine and hospital admission.  No chest or back pain. Plan to repeat imaging in 1 year. Would consider noncontrast imaging in the future.  Hypertension: BP mildly elevated today. We got distracted from rechecking today. He denies significant increase in BP during hospitalization.   Bradycardia s/p PPM implant: PPM implant 11/2020. Normal device function per remote device check on 01/10/2022.  Management per EP.    Disposition: 4 months with Dr. Eldridge Washington  Medication Adjustments/Labs and Tests Ordered: Current medicines are reviewed at length with the patient today.  Concerns regarding medicines are outlined above.  Orders Placed This Encounter  Procedures   Basic Metabolic Panel (BMET)   CBC   ECHOCARDIOGRAM COMPLETE   No orders of the defined types were placed in this encounter.   Patient Instructions  Medication Instructions:   Your physician recommends that you continue on your current medications as directed. Please refer to the Current Medication list given to you today.   *If you need a refill on your cardiac medications before your next appointment, please call your pharmacy*   Lab Work:  TODAY!!!!! BMET/CBC  If you have labs (blood work) drawn today and your tests are completely normal, you will receive your results only by: MyChart Message (if you have MyChart) OR A paper copy in the mail If you have any lab test that is abnormal or we need to change your treatment, we will call you to review the results.   Testing/Procedures:  Your physician has requested that you have an echocardiogram. Echocardiography is a painless test that uses sound waves to create images of your heart. It provides your doctor with information  about the size and shape of your heart and how well your heart's chambers and  valves are working. This procedure takes approximately one hour. There are no restrictions for this procedure.    Follow-Up: At [facility], you and your health needs are our priority.  As part of our continuing mission to provide you with exceptional heart care, we have created designated Provider Care Teams.  These Care Teams include your primary Cardiologist (physician) and Advanced Practice Providers (APPs -  Physician Assistants and Nurse Practitioners) who all work together to provide you with the care you need, when you need it.  We recommend signing up for the patient portal called "MyChart".  Sign up information is provided on this After Visit Summary.  MyChart is used to connect with patients for Virtual Visits (Telemedicine).  Patients are able to view lab/test results, encounter notes, upcoming appointments, etc.  Non-urgent messages can be sent to your provider as well.   To learn more about what you can do with MyChart, go to [email or web address].    Your next appointment:   4 month(s)  The format for your next appointment:   In Person  Provider:   Lance MussJayadeep Varanasi, MD     Important Information About Sugar         Signed, Levi AlandSwinyer, Breck Hollinger M, NP  02/15/2022 12:46 PM    [facility]

## 2022-02-15 NOTE — Patient Instructions (Signed)
Medication Instructions:   Your physician recommends that you continue on your current medications as directed. Please refer to the Current Medication list given to you today.   *If you need a refill on your cardiac medications before your next appointment, please call your pharmacy*   Lab Work:  TODAY!!!!! BMET/CBC  If you have labs (blood work) drawn today and your tests are completely normal, you will receive your results only by: MyChart Message (if you have MyChart) OR A paper copy in the mail If you have any lab test that is abnormal or we need to change your treatment, we will call you to review the results.   Testing/Procedures:  Your physician has requested that you have an echocardiogram. Echocardiography is a painless test that uses sound waves to create images of your heart. It provides your doctor with information about the size and shape of your heart and how well your heart's chambers and valves are working. This procedure takes approximately one hour. There are no restrictions for this procedure.    Follow-Up: At [facility], you and your health needs are our priority.  As part of our continuing mission to provide you with exceptional heart care, we have created designated Provider Care Teams.  These Care Teams include your primary Cardiologist (physician) and Advanced Practice Providers (APPs -  Physician Assistants and Nurse Practitioners) who all work together to provide you with the care you need, when you need it.  We recommend signing up for the patient portal called "MyChart".  Sign up information is provided on this After Visit Summary.  MyChart is used to connect with patients for Virtual Visits (Telemedicine).  Patients are able to view lab/test results, encounter notes, upcoming appointments, etc.  Non-urgent messages can be sent to your provider as well.   To learn more about what you can do with MyChart, go to [email or web address].    Your next  appointment:   4 month(s)  The format for your next appointment:   In Person  Provider:   Lance Muss, MD     Important Information About Sugar

## 2022-02-16 ENCOUNTER — Ambulatory Visit: Payer: Medicare Other | Admitting: Interventional Cardiology

## 2022-02-16 LAB — CBC
Hematocrit: 33.9 % — ABNORMAL LOW (ref 37.5–51.0)
Hemoglobin: 11.1 g/dL — ABNORMAL LOW (ref 13.0–17.7)
MCH: 32.1 pg (ref 26.6–33.0)
MCHC: 32.7 g/dL (ref 31.5–35.7)
MCV: 98 fL — ABNORMAL HIGH (ref 79–97)
Platelets: 220 10*3/uL (ref 150–450)
RBC: 3.46 x10E6/uL — ABNORMAL LOW (ref 4.14–5.80)
RDW: 14 % (ref 11.6–15.4)
WBC: 7.2 10*3/uL (ref 3.4–10.8)

## 2022-02-16 LAB — BASIC METABOLIC PANEL
BUN/Creatinine Ratio: 18 (ref 10–24)
BUN: 21 mg/dL (ref 8–27)
CO2: 25 mmol/L (ref 20–29)
Calcium: 10 mg/dL (ref 8.6–10.2)
Chloride: 106 mmol/L (ref 96–106)
Creatinine, Ser: 1.18 mg/dL (ref 0.76–1.27)
Glucose: 93 mg/dL (ref 70–99)
Potassium: 4 mmol/L (ref 3.5–5.2)
Sodium: 142 mmol/L (ref 134–144)
eGFR: 62 mL/min/{1.73_m2} (ref >59)

## 2022-03-02 ENCOUNTER — Ambulatory Visit (HOSPITAL_COMMUNITY): Payer: Medicare Other | Attending: Cardiovascular Disease

## 2022-03-02 DIAGNOSIS — R0609 Other forms of dyspnea: Secondary | ICD-10-CM | POA: Diagnosis present

## 2022-03-02 DIAGNOSIS — I10 Essential (primary) hypertension: Secondary | ICD-10-CM

## 2022-03-02 DIAGNOSIS — I442 Atrioventricular block, complete: Secondary | ICD-10-CM

## 2022-03-02 DIAGNOSIS — I712 Thoracic aortic aneurysm, without rupture, unspecified: Secondary | ICD-10-CM | POA: Diagnosis present

## 2022-03-02 DIAGNOSIS — Z952 Presence of prosthetic heart valve: Secondary | ICD-10-CM | POA: Diagnosis present

## 2022-03-02 LAB — ECHOCARDIOGRAM COMPLETE
AR max vel: 1.36 cm2
AV Area VTI: 1.43 cm2
AV Area mean vel: 1.3 cm2
AV Mean grad: 16.5 mmHg
AV Peak grad: 29.4 mmHg
Ao pk vel: 2.71 m/s
Area-P 1/2: 3.39 cm2
S' Lateral: 3.9 cm

## 2022-03-06 NOTE — Progress Notes (Deleted)
Cardiology Office Note:    Date:  03/06/2022   ID:  Derek Washington, DOB 1941/03/01, MRN [id number]  PCP:  [name], MD   [facility] Providers Cardiologist:  Lance Muss, MD     Referring MD: [name], MD   Chief Complaint: shortness of breath  History of Present Illness:    Derek Washington is a very pleasant 81 y.o. male with a hx of bicuspid aortic valve, severe aortic stenosis s/p AVR, nonobstructive CAD, HTN, HLD, and thoracic aortic aneurysm.   Moved to [place] from [place] and initially established care with our group in 2020.  He reported shortness of breath in 2022.  Echo showed aortic valve mean gradient increased to 36 mm, aortic root was 44 mm.  Had some near syncopal events at that time.  He underwent cardiac catheterization and subsequently underwent AVR using 25 mm Edwards Inspiris pericardial valve on 12/01/2020 by Dr. Laneta Simmers. 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 He underwent PPM implant 6 days postop. Additionally there was a fib and flutter postop. 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  He was placed on Eliquis for stroke prevention, CHA2DS2-VASc  His last office visit was 01/10/2022 with Dr. Graciela Husbands. No changes to PPM were made and he was advised to return in 1 year for follow-up.  Last seen by Dr. Eldridge Dace on 10/17/21. CTA revealed stable ascending aortic aneursym at 4.2 cm, stable in comparison with previous imaging. Plan to repeat in 1 year. No additional changes were made to treatment plan and follow-up in 6 months was recommended.  Admission 8/15-8/22/23 for AKI after having lab work with his PCP due to fatigue, generalized weakness.  Significant acute kidney injury with creatinine of 11.61 and potassium of 6.1 upon arrival to hospital.  Additionally was found to have kidney stones.   Discharge creatinine was 2.46.  He contacted our office on 02/14/2022 to report shortness of breath when walking the previous few days.  Symptoms improved with rest.  Also had trouble sleeping 2 nights prior due to shortness of breath.  No swelling, weight stable, congestion.  He had recent hospitalization for kidney injury 2/2 CT contrast. Creatinine 2.46 at discharge. He was scheduled for office visit.  I saw him in the office on 02/15/22 for new shortness of breath. Reported over previous 2 days, he has noticed shortness of breath going up 15 steps at home and on his routine walk with his wife. Cannot get a good breath. Feeling of inability to take a deep breath makes him feel anxious. Is frequently worried about something being wrong with him. Has been through a lot of medical procedures over the last 14 months. Did not feel this way during or after hospitalization. He denied chest pain, lower extremity edema, fatigue, palpitations, melena, hematuria, hemoptysis, diaphoresis, weakness, presyncope, syncope, orthopnea, and PND. TTE revealed newly reduced LVEF 40 to 45%, mild LVH, grade 2 diastolic dysfunction, mild to moderate MR, moderate TR, bioprosthetic aortic valve with normal function.   He returns today for management of   Past Medical History:  Diagnosis Date   Bicuspid aortic valve    LAST ECHOCARDIOGRAM APPROXIMATELY 2016? MILD AORTIC REGURG   BPH (benign prostatic hyperplasia)    Hard of hearing    Heart murmur    High cholesterol    Hydrocele  Hypertension    Insomnia    Low back pain    RIGHT   Mild obesity    Nocturia    Presence of permanent cardiac pacemaker    Right elbow tendonitis    Tinnitus     Past Surgical History:  Procedure Laterality Date   AORTIC VALVE REPLACEMENT N/A 12/01/2020   Procedure: AORTIC VALVE REPLACEMENT (AVR) USING INSPIRIS 25MM AORTIC VALVE;  Surgeon: Gaye Pollack, MD;  Location: [place];  Service: Open Heart Surgery;  Laterality: N/A;    CYSTOSCOPY WITH URETEROSCOPY, STONE BASKETRY AND STENT PLACEMENT Left 02/05/2022   Procedure: CYSTOSCOPY WITH LEFT URERETAL STENT PLACEMENT, RETROGRADE PYELOGRAM, URETEROSCOPY WITH STONE EXTRACTION, LASER LITHOTRIPSY;  Surgeon: Raynelle Bring, MD;  Location: [facility];  Service: Urology;  Laterality: Left;   HYDROCELE EXCISION  2017   DR. [name] 2017   IR FLUORO GUIDE CV LINE RIGHT  02/01/2022   IR US GUIDE VASC ACCESS RIGHT  02/01/2022   JOINT REPLACEMENT     left total knee replacement      2007    PACEMAKER IMPLANT N/A 12/07/2020   Procedure: PACEMAKER IMPLANT;  Surgeon: Deboraha Sprang, MD;  Location: [place] [facility];  Service: Cardiovascular;  Laterality: N/A;   right knee total replacement      2011   RIGHT/LEFT HEART CATH AND CORONARY ANGIOGRAPHY N/A 11/03/2020   Procedure: RIGHT/LEFT HEART CATH AND CORONARY ANGIOGRAPHY;  Surgeon: Lorretta Harp, MD;  Location: [place] [facility];  Service: Cardiovascular;  Laterality: N/A;   SCROTAL EXPLORATION N/A 12/26/2015   Procedure: SCROTUM EXPLORATION;  Surgeon: Raynelle Bring, MD;  Location: [facility];  Service: Urology;  Laterality: N/A;  EXCISION OF LEFT EPIDIDYMAL CYST/SPERMATOCELE   TEE WITHOUT CARDIOVERSION N/A 12/01/2020   Procedure: TRANSESOPHAGEAL ECHOCARDIOGRAM (TEE);  Surgeon: Gaye Pollack, MD;  Location: [place];  Service: Open Heart Surgery;  Laterality: N/A;   TONSILLECTOMY     VASECTOMY     1968    Current Medications: No outpatient medications have been marked as taking for the 03/08/22 encounter (Appointment) with Ann Maki, Lanice Schwab, NP.     Allergies:   Hydrochlorothiazide   Social History   Socioeconomic History   Marital status: Married    Spouse name: Not on file   Number of children: 1   Years of education: Not on file   Highest education level: Not on file  Occupational History   Occupation: RETIRED  Tobacco Use   Smoking status: Never   Smokeless tobacco: Never  Vaping Use   Vaping Use: Never used   Substance and Sexual Activity   Alcohol use: Yes    Comment: occas    Drug use: No   Sexual activity: Not on file  Other Topics Concern   Not on file  Social History Narrative   Not on file   Social Determinants of Health   Financial Resource Strain: Not on file  Food Insecurity: Not on file  Transportation Needs: Not on file  Physical Activity: Not on file  Stress: Not on file  Social Connections: Not on file     Family History: The patient's family history includes COPD (age of onset: 35) in his father; Other (age of onset: 58) in his mother and sister.  ROS:   Please see the history of present illness.    + SOB All other systems reviewed and are negative.  Labs/Other Studies Reviewed:    The following studies were reviewed today:  Echo 03/02/22  1. Left ventricular ejection fraction, by estimation, is 40 to 45%. Left  ventricular ejection fraction by 3D volume is 40 %. The left ventricle has  mildly decreased function. The left ventricle demonstrates global  hypokinesis. There is mild left  ventricular hypertrophy of the basal-septal segment. Left ventricular  diastolic parameters are consistent with Grade II diastolic dysfunction  (pseudonormalization). Elevated left atrial pressure. The average left  ventricular global longitudinal strain is   -14.6 %. The global longitudinal strain is abnormal.   2. Right ventricular systolic function is normal. The right ventricular  size is normal. There is mildly elevated pulmonary artery systolic  pressure.   3. The mitral valve is normal in structure. Mild to moderate mitral valve  regurgitation.   4. Tricuspid valve regurgitation is moderate.   5. The aortic valve has been repaired/replaced. Aortic valve  regurgitation is not visualized. There is a 25 mm bioprosthetic valve  present in the aortic position. Aortic valve mean gradient measures 16.5  mmHg. Aortic valve Vmax measures 2.71 m/s.   6. There is mild  dilatation of the ascending aorta, measuring 43 mm.   7. The inferior vena cava is normal in size with greater than 50%  respiratory variability, suggesting right atrial pressure of 3 mmHg.   Comparison(s): The left ventricular systolic function is worsened. The  left ventricular diastolic function is significantly worse. The reduction  in LV function is at least partly related to profound RV pacing-induced  dyssynchrony (although the rhythm was   also 100% V-paced on the previous echo as well).   CTA Chest Aorta 01/22/22  IMPRESSION: 1. Ascending thoracic aorta measures 4.2 cm without significant interval change. Mild cardiomegaly. There has been interval aortic valve replacement. No pericardial effusion.  Echo 01/16/21   1. There is a 26 mm Inspiris valve present in the aortic position Aortic  valve regurgitation is not visualized. Aortic valve mean gradient measures  12.0 mmHg. Aortic valve Vmax measures 2.33 m/s. Aortic valve acceleration  time measures 71 msec.DVI 0.45.   Suggestive of stable prosthetic function.   2. Left ventricular ejection fraction, by estimation, is 55 to 60%. The  left ventricular internal cavity size was normal in size. There is mild  concentric left ventricular hypertrophy of the basal-septal segment.  Abnormal (paradoxical) septal motion  consistent with post-operative status. Left ventricular diastolic  parameters are consistent with Grade I diastolic dysfunction (impaired  relaxation).   3. Right ventricular systolic function is normal. The right ventricular  size is normal.   4. Aortic dilatation noted. There is mild dilatation of the ascending  aorta, measuring 42 mm.   5. The mitral valve is normal in structure. Trivial mitral valve  regurgitation. No evidence of mitral stenosis.   Comparison(s): A prior study was performed on 10/27/2020. Resolution of  aortic regurgitation and stenosis.   R/LHC 11/03/20  RPAV lesion is 50%  stenosed. Hemodynamic findings consistent with aortic valve stenosis.  IMPRESSION: Mr. Carroll has minimal CAD with at most a 50% lesion in his PLA branch.  I suspect his symptoms are related to his bicuspid aortic valve stenosis/regurgitation which will be addressed by Dr. Eldridge Dace as an outpatient.  The sheath was removed and a TR band was placed on the right wrist to achieve patent hemostasis.  The patient left lab in stable condition.     Recent Labs: 01/30/2022: B Natriuretic Peptide 478.3 01/31/2022: ALT 24 02/01/2022: Magnesium 2.3 02/15/2022: BUN 21; Creatinine, Ser 1.18; Hemoglobin 11.1; Platelets 220; Potassium  4.0; Sodium 142  Recent Lipid Panel No results found for: "CHOL", "TRIG", "HDL", "CHOLHDL", "VLDL", "LDLCALC", "LDLDIRECT"   Risk Assessment/Calculations:    CHA2DS2-VASc Score = 3  This indicates a 3.2% annual risk of stroke. The patient's score is based upon: CHF History: 0 HTN History: 1 Diabetes History: 0 Stroke History: 0 Vascular Disease History: 0 Age Score: 2 Gender Score: 0    Physical Exam:    VS:  There were no vitals taken for this visit.    Wt Readings from Last 3 Encounters:  02/15/22 226 lb 6.4 oz (102.7 kg)  02/06/22 227 lb 4.7 oz (103.1 kg)  01/10/22 224 lb 3.2 oz (101.7 kg)     GEN:  Well nourished, well developed in no acute distress HEENT: Normal NECK: No JVD; No carotid bruits CARDIAC: RRR, 3/6 holosystolic murmur. No rubs, gallops RESPIRATORY:  Clear to auscultation without rales, wheezing or rhonchi  ABDOMEN: Soft, non-tender, non-distended MUSCULOSKELETAL:  No edema; No deformity. 2+ pedal pulses, equal bilaterally SKIN: Warm and dry NEUROLOGIC:  Alert and oriented x 3 PSYCHIATRIC:  Normal affect   EKG:  EKG is not ordered today.  The ekg ordered today demonstrates    Diagnoses:    No diagnosis found.  Assessment and Plan:     Dyspnea on exertion: Increased DOE for the past 2 days with activities that he performs on a  routine basis without symptoms previously. No chest pain, edema, orthopnea, PND. States on the first night when symptoms appeared, he had difficulty getting comfortable in bed.  States he slept better last night.  Suspect there is a component of anxiety that is exacerbating this.  We will get echocardiogram to evaluate for structural heart disease.  As noted below he has a history of AVR. Will recheck CBC today to ensure blood counts continue to improve. Has some hematuria due to kidney stones. Will f/u with Dr. Laverle PatterBorden, urology, next week.   Aortic stenosis s/p AVR: History of bicuspid aortic valve he underwent AVR with pericardial valve on 12/01/2020. Most recent echocardiogram showed normal valve function. No indication of worsening valve function on exam today.  As noted above, we are getting echocardiogram to evaluate for structural heart disease and will assess valve function as well.  Thoracic aortic aneurysm: 4.2 cm on CTA 01/22/22. AKI following CT with significant increase in creatinine and hospital admission.  No chest or back pain. Plan to repeat imaging in 1 year. Would consider noncontrast imaging in the future.  Hypertension: BP mildly elevated today. We got distracted from rechecking today. He denies significant increase in BP during hospitalization.   Bradycardia s/p PPM implant: PPM implant 11/2020. Echo 03/02/21 revealed dyssychrony 2/2 profound RV pacing. Will refer back to Dr. Graciela HusbandsKlein for consideration of upgrade to BiV pacer.     Disposition: ***  Medication Adjustments/Labs and Tests Ordered: Current medicines are reviewed at length with the patient today.  Concerns regarding medicines are outlined above.  No orders of the defined types were placed in this encounter.  No orders of the defined types were placed in this encounter.   There are no Patient Instructions on file for this visit.   Signed, Levi AlandSwinyer, Victorino Fatzinger M, NP  03/06/2022 4:26 PM     [facility]

## 2022-03-07 ENCOUNTER — Ambulatory Visit (INDEPENDENT_AMBULATORY_CARE_PROVIDER_SITE_OTHER): Payer: Medicare Other

## 2022-03-07 DIAGNOSIS — I442 Atrioventricular block, complete: Secondary | ICD-10-CM | POA: Diagnosis not present

## 2022-03-07 LAB — CUP PACEART REMOTE DEVICE CHECK
Battery Remaining Longevity: 131 mo
Battery Voltage: 3.04 V
Brady Statistic AP VP Percent: 21.08 %
Brady Statistic AP VS Percent: 0 %
Brady Statistic AS VP Percent: 78.6 %
Brady Statistic AS VS Percent: 0.32 %
Brady Statistic RA Percent Paced: 21.29 %
Brady Statistic RV Percent Paced: 99.68 %
Date Time Interrogation Session: 20230919203631
Implantable Lead Implant Date: 20220622
Implantable Lead Implant Date: 20220622
Implantable Lead Location: 753859
Implantable Lead Location: 753860
Implantable Lead Model: 3830
Implantable Lead Model: 5076
Implantable Pulse Generator Implant Date: 20220622
Lead Channel Impedance Value: 209 Ohm
Lead Channel Impedance Value: 380 Ohm
Lead Channel Impedance Value: 456 Ohm
Lead Channel Impedance Value: 570 Ohm
Lead Channel Pacing Threshold Amplitude: 0.625 V
Lead Channel Pacing Threshold Amplitude: 1.125 V
Lead Channel Pacing Threshold Pulse Width: 0.4 ms
Lead Channel Pacing Threshold Pulse Width: 0.4 ms
Lead Channel Sensing Intrinsic Amplitude: 1.375 mV
Lead Channel Sensing Intrinsic Amplitude: 1.375 mV
Lead Channel Sensing Intrinsic Amplitude: 12 mV
Lead Channel Sensing Intrinsic Amplitude: 31.625 mV
Lead Channel Setting Pacing Amplitude: 2 V
Lead Channel Setting Pacing Amplitude: 2.5 V
Lead Channel Setting Pacing Pulse Width: 0.4 ms
Lead Channel Setting Sensing Sensitivity: 0.9 mV

## 2022-03-08 ENCOUNTER — Ambulatory Visit: Payer: Medicare Other | Admitting: Nurse Practitioner

## 2022-03-09 ENCOUNTER — Encounter: Payer: Self-pay | Admitting: Interventional Cardiology

## 2022-03-09 ENCOUNTER — Ambulatory Visit: Payer: Medicare Other | Attending: Nurse Practitioner | Admitting: Interventional Cardiology

## 2022-03-09 VITALS — BP 142/64 | HR 70 | Ht 72.0 in | Wt 223.0 lb

## 2022-03-09 DIAGNOSIS — I712 Thoracic aortic aneurysm, without rupture, unspecified: Secondary | ICD-10-CM | POA: Diagnosis not present

## 2022-03-09 DIAGNOSIS — Z952 Presence of prosthetic heart valve: Secondary | ICD-10-CM

## 2022-03-09 DIAGNOSIS — I442 Atrioventricular block, complete: Secondary | ICD-10-CM

## 2022-03-09 DIAGNOSIS — R0609 Other forms of dyspnea: Secondary | ICD-10-CM

## 2022-03-09 DIAGNOSIS — I10 Essential (primary) hypertension: Secondary | ICD-10-CM

## 2022-03-09 MED ORDER — CARVEDILOL 3.125 MG PO TABS: 3.1250 mg | ORAL_TABLET | Freq: Two times a day (BID) | ORAL | 3 refills | Status: DC

## 2022-03-09 MED ORDER — APIXABAN 5 MG PO TABS: 5.0000 mg | ORAL_TABLET | Freq: Two times a day (BID) | ORAL | 3 refills | Status: DC

## 2022-03-09 NOTE — Progress Notes (Signed)
Cardiology Office Note   Date:  03/09/2022   ID:  Derek Washington, DOB 1941/04/17, MRN [id number]  PCP:  [name], MD    No chief complaint on file.  S/p AVR, pacer  Wt Readings from Last 3 Encounters:  03/09/22 223 lb (101.2 kg)  02/15/22 226 lb 6.4 oz (102.7 kg)  02/06/22 227 lb 4.7 oz (103.1 kg)       History of Present Illness: Derek Washington is a 81 y.o. male   with a history of bicuspid aortic valve, aortic stenosis and aortic regurgitation.    He moved to [place] from [place].     He reported more shortness of breath in 2022. Echo showed Aortic valve Mean gradient increased to 36 mm.  Ao root was 44.  Had some near syncope at that time.  He eventually had cath and AVR.  Due to persistent bradycardia, he had a pacer placed.  He had atrial flutter as well.  Eliquis was started due to high burden of AFib.  In 04/2021: "Had some dizziness with turning his head a certain way.  He feels like the room is spinning.  Stamina has improved greatly.  He thinks the aortic stenosis was likely bothering him for several months prior to his surgery but he just avoided activities that cause symptoms.  Now he is walking up hills without any difficulty."   Still does Epley maneuvers.    He had ARF in 2023.  Needed dialysis transiently. 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 Plan to repeat in 1 year. No additional changes were made to treatment plan and follow-up in 6 months was recommended.   Admission 8/15-8/22/23 for AKI after having lab work with his PCP due to fatigue, generalized weakness.  Significant acute kidney injury with creatinine of 11.61 and potassium of 6.1 upon arrival to hospital.  Additionally was found to have kidney stones.  Discharge creatinine was 2.46.  He had lithotripsy. He passed some.     He contacted our office on 02/14/2022 to report shortness of breath when walking the previous few days.   Symptoms improved with rest.   Echo in 9/23: "Left ventricular ejection fraction, by estimation, is 40 to 45%. Left  ventricular ejection fraction by 3D volume is 40 %. The left ventricle has  mildly decreased function. The left ventricle demonstrates global  hypokinesis. There is mild left  ventricular hypertrophy of the basal-septal segment. Left ventricular  diastolic parameters are consistent with Grade II diastolic dysfunction  (pseudonormalization). Elevated left atrial pressure. The average left  ventricular global longitudinal strain is   -14.6 %. The global longitudinal strain is abnormal.   2. Right ventricular systolic function is normal. The right ventricular  size is normal. There is mildly elevated pulmonary artery systolic  pressure.   3. The mitral valve is normal in structure. Mild to moderate mitral valve  regurgitation.   4. Tricuspid valve regurgitation is moderate.   5. The aortic valve has been repaired/replaced. Aortic valve  regurgitation is not visualized. There is a 25 mm bioprosthetic valve  present in the aortic position. Aortic valve mean gradient measures 16.5  mmHg. Aortic valve Vmax measures 2.71 m/s.   6. 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   7. The inferior vena cava is normal in size with greater than 50%  respiratory variability, suggesting right atrial pressure of 3 mmHg. "   Still  having more DOE.  It is episodic now. Occurs with showers at times.  Still able to walk his usual walk around the neighborhood including a significant uphill climb without any symptoms.  Still notes high BP readings.  Blood pressure readings of 160 at home in the last few days.  Hydralazine improves it temporarily but the blood pressure will go back up.  Past Medical History:  Diagnosis Date   Bicuspid aortic valve    LAST ECHOCARDIOGRAM APPROXIMATELY 2016? MILD AORTIC REGURG   BPH (benign prostatic hyperplasia)    Hard of hearing     Heart murmur    High cholesterol    Hydrocele    Hypertension    Insomnia    Low back pain    RIGHT   Mild obesity    Nocturia    Presence of permanent cardiac pacemaker    Right elbow tendonitis    Tinnitus     Past Surgical History:  Procedure Laterality Date   AORTIC VALVE REPLACEMENT N/A 12/01/2020   Procedure: AORTIC VALVE REPLACEMENT (AVR) USING INSPIRIS AORTIC VALVE;  Surgeon: [name], MD;  Location: [facility] OR;  Service: Open Heart Surgery;  Laterality: N/A;   CYSTOSCOPY WITH URETEROSCOPY, STONE BASKETRY AND STENT PLACEMENT Left 02/05/2022   Procedure: CYSTOSCOPY WITH LEFT URERETAL STENT PLACEMENT, RETROGRADE PYELOGRAM, URETEROSCOPY WITH STONE EXTRACTION, LASER LITHOTRIPSY;  Surgeon: [name], MD;  Location: [facility];  Service: Urology;  Laterality: Left;   HYDROCELE EXCISION  2017   DR. [name] 2017   IR FLUORO GUIDE CV LINE RIGHT  02/01/2022   IR US GUIDE VASC ACCESS RIGHT  02/01/2022   JOINT REPLACEMENT     left total knee replacement      2007    PACEMAKER IMPLANT N/A 12/07/2020   Procedure: PACEMAKER IMPLANT;  Surgeon: [name], MD;  Location: [facility] INVASIVE CV LAB;  Service: Cardiovascular;  Laterality: N/A;   right knee total replacement      2011   RIGHT/LEFT HEART CATH AND CORONARY ANGIOGRAPHY N/A 11/03/2020   Procedure: RIGHT/LEFT HEART CATH AND CORONARY ANGIOGRAPHY;  Surgeon: [name], MD;  Location: [facility] INVASIVE CV LAB;  Service: Cardiovascular;  Laterality: N/A;   SCROTAL EXPLORATION N/A 12/26/2015   Procedure: SCROTUM EXPLORATION;  Surgeon: [name], MD;  Location: [facility];  Service: Urology;  Laterality: N/A;  EXCISION OF LEFT EPIDIDYMAL CYST/SPERMATOCELE   TEE WITHOUT CARDIOVERSION N/A 12/01/2020   Procedure: TRANSESOPHAGEAL ECHOCARDIOGRAM (TEE);  Surgeon: [name], MD;  Location: [facility];  Service: Open Heart Surgery;  Laterality: N/A;   TONSILLECTOMY     VASECTOMY     1968     Current Outpatient Medications   Medication Sig Dispense Refill   acetaminophen (TYLENOL) 325 MG tablet Take 2 tablets (650 mg total) by mouth every 6 (six) hours as needed for mild pain (or Fever >/= 101).     amLODipine (NORVASC) 5 MG tablet Take 1 tablet (5 mg total) by mouth every evening. 30 tablet 3   apixaban (ELIQUIS) 2.5 MG TABS tablet Take 1 tablet (2.5 mg total) by mouth 2 (two) times daily. 180 tablet 0   atorvastatin (LIPITOR) 20 MG tablet Take 20 mg by mouth every evening.     cholecalciferol (VITAMIN D) 25 MCG (1000 UNIT) tablet Take 1,000 Units by mouth in the morning.     finasteride (PROSCAR) 5 MG tablet Take 5 mg by mouth in the morning.     hydrALAZINE (APRESOLINE) 50 MG tablet  Take 1 tablet (50 mg total) by mouth 3 (three) times daily. 90 tablet 2   metoprolol succinate (TOPROL-XL) 25 MG 24 hr tablet Take 25 mg by mouth daily.     Multiple Vitamin (MULTIVITAMIN WITH MINERALS) TABS tablet Take 1 tablet by mouth in the morning.     Omega-3 Fatty Acids (FISH OIL) 1000 MG CAPS Take 1,000 mg by mouth 2 (two) times daily.     No current facility-administered medications for this visit.    Allergies:   Hydrochlorothiazide    Social History:  The patient  reports that he has never smoked. He has never used smokeless tobacco. He reports current alcohol use. He reports that he does not use drugs.   Family History:  The patient's family history includes COPD (age of onset: 8560) in his father; Other (age of onset: 3471) in his mother and sister.    ROS:  Please see the history of present illness.   Otherwise, review of systems are positive for intermittent DOE.   All other systems are reviewed and negative.    PHYSICAL EXAM: VS:  BP (!) 142/64   Pulse 70   Ht 6' (1.829 m)   Wt 223 lb (101.2 kg)   SpO2 97%   BMI 30.24 kg/m  , BMI Body mass index is 30.24 kg/m. GEN: Well nourished, well developed, in no acute distress HEENT: normal Neck: no JVD, carotid bruits, or masses Cardiac: RRR; 2/6 early systolic  murmur, no , or gallops,no edema  Respiratory:  clear to auscultation bilaterally, normal work of breathing GI: soft, nontender, nondistended, + BS MS: no deformity or atrophy Skin: warm and dry, no rash Neuro:  Strength and sensation are intact Psych: euthymic mood, full affect    Recent Labs: 01/30/2022: B Natriuretic Peptide 478.3 01/31/2022: ALT 24 02/01/2022: Magnesium 2.3 02/15/2022: BUN 21; Creatinine, Ser 1.18; Hemoglobin 11.1; Platelets 220; Potassium 4.0; Sodium 142   Lipid Panel No results found for: "CHOL", "TRIG", "HDL", "CHOLHDL", "VLDL", "LDLCALC", "LDLDIRECT"   Other studies Reviewed: Additional studies/ records that were reviewed today with results demonstrating: labs reviewed: Cr 1.21.   ASSESSMENT AND PLAN:  S/p AVR: Valve appears to be functioning well.  Continue with SBE prophylaxis.  Increase Eliquis back to 5 mg twice daily given his recovery of renal function. CHronic systolic heart failure: No signs of congestive heart failure.  No evidence of volume overload.  No orthopnea or leg edema.  I doubt this is related to coronary artery disease as his cath in May 2022 did not show any significant disease.  He is getting back to regular walking.  His shortness of breath occurs at unconventional times for diagnosis of systolic failure.  Continue to try to be active.  He is working his way back to 150 minutes of exercise per week.  He did not feel well on metoprolol.  Will start carvedilol 3.125 mg twice daily.  We will check with EP as to whether the timing between atrial and ventricular leads needs to be adjusted to help optimize LV function.  If no adjustments to the pacemaker can be made, we will try to optimize medical therapy.  Would be cautious about Entresto or Aldactone due to his recent renal failure.  Continue hydralazine.  Will uptitrate beta-blocker as tolerated.  I do not think he is currently symptomatic enough to consider BiV pacing.  We will likely plan  echocardiogram at the end of the year. Hypertension: Continue amlodipine and hydralazine.  Adding carvedilol.  Would be careful to avoid medicines with renal side effects. LBBB: We will have to be careful that we do not uptitrate beta-blocker to far that he increases the amount of pacing he requires.  In terms of screening for his dilated aorta and the future, would have to consider MR or echocardiography.  He would obviously be very hesitant to do another CT scan.   Current medicines are reviewed at length with the patient today.  The patient concerns regarding his medicines were addressed.  The following changes have been made: Start carvedilol  Labs/ tests ordered today include:  No orders of the defined types were placed in this encounter.   Recommend 150 minutes/week of aerobic exercise Low fat, low carb, high fiber diet recommended  Disposition:   FU in 6 weeks for blood pressure check.  He should also check his blood pressure at home   Signed, Larae Grooms, MD  03/09/2022 10:19 AM    [facility] [place], [place], [place]  [address] Phone: [phone]; Fax: [phone]

## 2022-03-09 NOTE — Patient Instructions (Signed)
Medication Instructions:  Your physician has recommended you make the following change in your medication: Stop metoprolol succinate. Start Carvedilol 3.125 mg by mouth twice daily. Change Eliquis back to 5 mg by mouth twice daily   *If you need a refill on your cardiac medications before your next appointment, please call your pharmacy*   Lab Work: none If you have labs (blood work) drawn today and your tests are completely normal, you will receive your results only by: [place] (if you have MyChart) OR A paper copy in the mail If you have any lab test that is abnormal or we need to change your treatment, we will call you to review the results.   Testing/Procedures: none   Follow-Up: At [facility], you and your health needs are our priority.  As part of our continuing mission to provide you with exceptional heart care, we have created designated Provider Care Teams.  These Care Teams include your primary Cardiologist (physician) and Advanced Practice Providers (APPs -  Physician Assistants and Nurse Practitioners) who all work together to provide you with the care you need, when you need it.  We recommend signing up for the patient portal called "MyChart".  Sign up information is provided on this After Visit Summary.  MyChart is used to connect with patients for Virtual Visits (Telemedicine).  Patients are able to view lab/test results, encounter notes, upcoming appointments, etc.  Non-urgent messages can be sent to your provider as well.   To learn more about what you can do with MyChart, go to [email or web address].    Your next appointment:   April 24, 2022  The format for your next appointment:   In Person  Provider:   Larae Grooms, MD     Other Instructions    Important Information About Sugar

## 2022-03-09 NOTE — Addendum Note (Signed)
Addended by: Thompson Grayer on: 03/09/2022 11:36 AM   Modules accepted: Orders

## 2022-03-20 NOTE — Progress Notes (Signed)
Remote pacemaker transmission.   

## 2022-04-02 ENCOUNTER — Other Ambulatory Visit: Payer: Self-pay | Admitting: Internal Medicine

## 2022-04-02 ENCOUNTER — Ambulatory Visit
Admission: RE | Admit: 2022-04-02 | Discharge: 2022-04-02 | Disposition: A | Discharge status: Home / Self Care | Payer: Medicare Other | Source: Ambulatory Visit | Attending: Internal Medicine | Admitting: Internal Medicine

## 2022-04-02 DIAGNOSIS — M7989 Other specified soft tissue disorders: Secondary | ICD-10-CM

## 2022-04-23 NOTE — Progress Notes (Unsigned)
Cardiology Office Note   Date:  04/24/2022   ID:  Derek Washington, DOB [birth year], MRN [id number]  PCP:  Derek Huddle, MD    No chief complaint on file.  S/p AVR, pacer  Wt Readings from Last 3 Encounters:  04/24/22 226 lb (102.5 kg)  03/09/22 223 lb (101.2 kg)  02/15/22 226 lb 6.4 oz (102.7 kg)       History of Present Illness: Derek Washington is a 81 y.o. male   with a history of bicuspid aortic valve, aortic stenosis and aortic regurgitation.    He moved to [place] from [place].     He reported more shortness of breath in 2022. Echo showed Aortic valve Mean gradient increased to 36 mm.  Ao root was 44.  Had some near syncope at that time.  He eventually had cath and AVR.  Due to persistent bradycardia, he had a pacer placed.  He had atrial flutter as well.  Eliquis was started due to high burden of AFib.  In 04/2021: "Had some dizziness with turning his head a certain way.  He feels like the room is spinning.  Stamina has improved greatly.  He thinks the aortic stenosis was likely bothering him for several months prior to his surgery but he just avoided activities that cause symptoms.  Now he is walking up hills without any difficulty."   Still does Epley maneuvers.     He had ARF in 2023.  Needed dialysis transiently. 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 Plan to repeat in 1 year. No additional changes were made to treatment plan and follow-up in 6 months was recommended.   Admission 8/15-8/22/23 for AKI after having lab work with his PCP due to fatigue, generalized weakness.  Significant acute kidney injury with creatinine of 11.61 and potassium of 6.1 upon arrival to hospital.  Additionally was found to have kidney stones.  Discharge creatinine was 2.46.  He had lithotripsy. He passed some.     He contacted our office on 02/14/2022 to report shortness of breath when walking the previous few days.  Symptoms  improved with rest.   Echo in 9/23: "Left ventricular ejection fraction, by estimation, is 40 to 45%. Left  ventricular ejection fraction by 3D volume is 40 %. The left ventricle has  mildly decreased function. The left ventricle demonstrates global  hypokinesis. The aortic valve has been repaired/replaced. Aortic valve  regurgitation is not visualized. There is a 25 mm bioprosthetic valve  present in the aortic position. Aortic valve mean gradient measures 16.5  mmHg. Aortic valve Vmax measures 2.71 m/s.   6. 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   In September 2023, he reported some episodic dyspnea exertion.  Activities such as taking a shower would cause some shortness of breath.  He was doing well with it.  He was also having some occasional high blood pressure readings.  Renal failure limited choice of blood pressure medicines.   Since the addition of Coreg, he has felt better.  He had a left foot problem.  Treated for gout, with prednisone and an antibiotic.  This resolved.  Feels good walking.    BP has been up and down at home.  Highest reading was 160s/90s.   Past Medical History:  Diagnosis Date   Bicuspid aortic valve    LAST ECHOCARDIOGRAM APPROXIMATELY 2016? MILD AORTIC REGURG   BPH (benign prostatic hyperplasia)  Hard of hearing    Heart murmur    High cholesterol    Hydrocele    Hypertension    Insomnia    Low back pain    RIGHT   Mild obesity    Nocturia    Presence of permanent cardiac pacemaker    Right elbow tendonitis    Tinnitus     Past Surgical History:  Procedure Laterality Date   AORTIC VALVE REPLACEMENT N/A 12/01/2020   Procedure: AORTIC VALVE REPLACEMENT (AVR) USING INSPIRIS 25MM AORTIC VALVE;  Surgeon: Derek Pollack, MD;  Location: [facility] OR;  Service: Open Heart Surgery;  Laterality: N/A;   CYSTOSCOPY WITH URETEROSCOPY, STONE BASKETRY AND STENT PLACEMENT Left 02/05/2022   Procedure: CYSTOSCOPY WITH LEFT URERETAL STENT  PLACEMENT, RETROGRADE PYELOGRAM, URETEROSCOPY WITH STONE EXTRACTION, LASER LITHOTRIPSY;  Surgeon: Derek Bring, MD;  Location: [facility];  Service: Urology;  Laterality: Left;   HYDROCELE EXCISION  2017   DR. LESTER Washington 2017   IR FLUORO GUIDE CV LINE RIGHT  02/01/2022   IR US GUIDE VASC ACCESS RIGHT  02/01/2022   JOINT REPLACEMENT     left total knee replacement      2007    PACEMAKER IMPLANT N/A 12/07/2020   Procedure: PACEMAKER IMPLANT;  Surgeon: Derek Sprang, MD;  Location: [facility] CV LAB;  Service: Cardiovascular;  Laterality: N/A;   right knee total replacement      2011   RIGHT/LEFT HEART CATH AND CORONARY ANGIOGRAPHY N/A 11/03/2020   Procedure: RIGHT/LEFT HEART CATH AND CORONARY ANGIOGRAPHY;  Surgeon: Derek Harp, MD;  Location: [facility] CV LAB;  Service: Cardiovascular;  Laterality: N/A;   SCROTAL EXPLORATION N/A 12/26/2015   Procedure: SCROTUM EXPLORATION;  Surgeon: Derek Bring, MD;  Location: [facility];  Service: Urology;  Laterality: N/A;  EXCISION OF LEFT EPIDIDYMAL CYST/SPERMATOCELE   TEE WITHOUT CARDIOVERSION N/A 12/01/2020   Procedure: TRANSESOPHAGEAL ECHOCARDIOGRAM (TEE);  Surgeon: Derek Pollack, MD;  Location: [facility];  Service: Open Heart Surgery;  Laterality: N/A;   TONSILLECTOMY     VASECTOMY     1968     Current Outpatient Medications  Medication Sig Dispense Refill   acetaminophen (TYLENOL) 325 MG tablet Take 2 tablets (650 mg total) by mouth every 6 (six) hours as needed for mild pain (or Fever >/= 101).     amLODipine (NORVASC) 5 MG tablet Take 1 tablet (5 mg total) by mouth every evening. 30 tablet 3   apixaban (ELIQUIS) 5 MG TABS tablet Take 1 tablet (5 mg total) by mouth 2 (two) times daily. 180 tablet 3   atorvastatin (LIPITOR) 20 MG tablet Take 20 mg by mouth every evening.     carvedilol (COREG) 6.25 MG tablet Take 1 tablet (6.25 mg total) by mouth 2 (two) times daily. 180 tablet 3   cholecalciferol (VITAMIN D) 25 MCG (1000 UNIT) tablet Take  1,000 Units by mouth in the morning.     finasteride (PROSCAR) 5 MG tablet Take 5 mg by mouth in the morning.     hydrALAZINE (APRESOLINE) 50 MG tablet Take 1 tablet (50 mg total) by mouth 3 (three) times daily. 90 tablet 2   Multiple Vitamin (MULTIVITAMIN WITH MINERALS) TABS tablet Take 1 tablet by mouth in the morning.     Omega-3 Fatty Acids (FISH OIL) 1000 MG CAPS Take 1,000 mg by mouth 2 (two) times daily.     telmisartan (MICARDIS) 40 MG tablet Take 40 mg by mouth daily.     No  current facility-administered medications for this visit.    Allergies:   Hydrochlorothiazide    Social History:  The patient  reports that he has never smoked. He has never used smokeless tobacco. He reports current alcohol use. He reports that he does not use drugs.   Family History:  The patient's family history includes COPD (age of onset: 77) in his father; Other (age of onset: 66) in his mother and sister.    ROS:  Please see the history of present illness.   Otherwise, review of systems are positive for left foot swelling.   All other systems are reviewed and negative.    PHYSICAL EXAM: VS:  BP 138/80   Pulse 60   Ht 6' (1.829 m)   Wt 226 lb (102.5 kg)   SpO2 98%   BMI 30.65 kg/m  , BMI Body mass index is 30.65 kg/m. GEN: Well nourished, well developed, in no acute distress HEENT: normal Neck: no JVD, carotid bruits, or masses Cardiac: RRR; 2/6 systolic murmurs, no rubs, or gallops,no edema  Respiratory:  clear to auscultation bilaterally, normal work of breathing GI: soft, nontender, nondistended, + BS MS: no deformity or atrophy Skin: warm and dry, no rash Neuro:  Strength and sensation are intact Psych: euthymic mood, full affect   EKG:   The ekg ordered today demonstrates AV pacing   Recent Labs: 01/30/2022: B Natriuretic Peptide 478.3 01/31/2022: ALT 24 02/01/2022: Magnesium 2.3 02/15/2022: BUN 21; Creatinine, Ser 1.18; Hemoglobin 11.1; Platelets 220; Potassium 4.0; Sodium 142    Lipid Panel No results found for: "CHOL", "TRIG", "HDL", "CHOLHDL", "VLDL", "LDLCALC", "LDLDIRECT"   Other studies Reviewed: Additional studies/ records that were reviewed today with results demonstrating: Cr 1.0 in 10/23.   ASSESSMENT AND PLAN:  S/p AVR: Needs SBE prophylaxis.  Valve appears to be functioning well.  Okay to increase exercise as tolerated. A-fib: Eliquis for stroke prevention.  Check bmet today. Chronic systolic heart failure: Coreg added.  Increase carvedilol to 6.25 mg twice a day.  Check echo at the end of December to see if EF has improved.  I suspect his drop in LVEF was not related to ischemia given that he had no significant coronary disease at the time of his cardiac cath in 2022. HTN: Telmisartan recently added.  We will check be met to evaluate electrolytes and renal function LBBB: Now with pacemaker. Dilated aorta: The would have to be screened without contrast CT.   Current medicines are reviewed at length with the patient today.  The patient concerns regarding his medicines were addressed.  The following changes have been made:  a sabove  Labs/ tests ordered today include:   Orders Placed This Encounter  Procedures   CBC   Basic Metabolic Panel (BMET)   EKG 12-Lead   ECHOCARDIOGRAM COMPLETE    Recommend 150 minutes/week of aerobic exercise Low fat, low carb, high fiber diet recommended  Disposition:   FU in 2-3 months   Signed, Larae Grooms, MD  04/24/2022 11:08 AM    [facility] [place], [place], [place]  [address] Phone: [phone]; Fax: [phone]

## 2022-04-24 ENCOUNTER — Other Ambulatory Visit: Payer: Medicare Other

## 2022-04-24 ENCOUNTER — Ambulatory Visit: Payer: Medicare Other | Attending: Interventional Cardiology | Admitting: Interventional Cardiology

## 2022-04-24 ENCOUNTER — Encounter: Payer: Self-pay | Admitting: Interventional Cardiology

## 2022-04-24 VITALS — BP 138/80 | HR 60 | Ht 72.0 in | Wt 226.0 lb

## 2022-04-24 DIAGNOSIS — I712 Thoracic aortic aneurysm, without rupture, unspecified: Secondary | ICD-10-CM | POA: Diagnosis not present

## 2022-04-24 DIAGNOSIS — Z952 Presence of prosthetic heart valve: Secondary | ICD-10-CM

## 2022-04-24 DIAGNOSIS — Z950 Presence of cardiac pacemaker: Secondary | ICD-10-CM

## 2022-04-24 DIAGNOSIS — I10 Essential (primary) hypertension: Secondary | ICD-10-CM

## 2022-04-24 DIAGNOSIS — R0609 Other forms of dyspnea: Secondary | ICD-10-CM

## 2022-04-24 DIAGNOSIS — E782 Mixed hyperlipidemia: Secondary | ICD-10-CM

## 2022-04-24 MED ORDER — CARVEDILOL 6.25 MG PO TABS: 6.2500 mg | ORAL_TABLET | Freq: Two times a day (BID) | ORAL | 3 refills | Status: DC

## 2022-04-24 NOTE — Patient Instructions (Signed)
Medication Instructions:  Your physician has recommended you make the following change in your medication: Increase Carvedilol to 6.25 mg by mouth twice daily    *If you need a refill on your cardiac medications before your next appointment, please call your pharmacy*   Lab Work: Lab work to be done today--CBC and BMP If you have labs (blood work) drawn today and your tests are completely normal, you will receive your results only by: [place] (if you have MyChart) OR A paper copy in the mail If you have any lab test that is abnormal or we need to change your treatment, we will call you to review the results.   Testing/Procedures: Your physician has requested that you have an echocardiogram. Echocardiography is a painless test that uses sound waves to create images of your heart. It provides your doctor with information about the size and shape of your heart and how well your heart's chambers and valves are working. This procedure takes approximately one hour. There are no restrictions for this procedure. Please do NOT wear cologne, perfume, aftershave, or lotions (deodorant is allowed). Please arrive 15 minutes prior to your appointment time. To be done at the end of December   Follow-Up: At [facility], you and your health needs are our priority.  As part of our continuing mission to provide you with exceptional heart care, we have created designated Provider Care Teams.  These Care Teams include your primary Cardiologist (physician) and Advanced Practice Providers (APPs -  Physician Assistants and Nurse Practitioners) who all work together to provide you with the care you need, when you need it.  We recommend signing up for the patient portal called "MyChart".  Sign up information is provided on this After Visit Summary.  MyChart is used to connect with patients for Virtual Visits (Telemedicine).  Patients are able to view lab/test results, encounter notes, upcoming  appointments, etc.  Non-urgent messages can be sent to your provider as well.   To learn more about what you can do with MyChart, go to [email or web address].    Your next appointment:   January 2024  The format for your next appointment:   In Person  Provider:   Larae Grooms, MD     Other Instructions  Check blood pressure at home  Important Information About Sugar

## 2022-04-25 LAB — BASIC METABOLIC PANEL
BUN/Creatinine Ratio: 19 (ref 10–24)
BUN: 20 mg/dL (ref 8–27)
CO2: 25 mmol/L (ref 20–29)
Calcium: 9.6 mg/dL (ref 8.6–10.2)
Chloride: 105 mmol/L (ref 96–106)
Creatinine, Ser: 1.06 mg/dL (ref 0.76–1.27)
Glucose: 95 mg/dL (ref 70–99)
Potassium: 4.3 mmol/L (ref 3.5–5.2)
Sodium: 145 mmol/L — ABNORMAL HIGH (ref 134–144)
eGFR: 71 mL/min/{1.73_m2} (ref >59)

## 2022-04-25 LAB — CBC
Hematocrit: 37.6 % (ref 37.5–51.0)
Hemoglobin: 12.4 g/dL — ABNORMAL LOW (ref 13.0–17.7)
MCH: 30.6 pg (ref 26.6–33.0)
MCHC: 33 g/dL (ref 31.5–35.7)
MCV: 93 fL (ref 79–97)
Platelets: 194 10*3/uL (ref 150–450)
RBC: 4.05 x10E6/uL — ABNORMAL LOW (ref 4.14–5.80)
RDW: 13.4 % (ref 11.6–15.4)
WBC: 6.7 10*3/uL (ref 3.4–10.8)

## 2022-04-29 ENCOUNTER — Emergency Department (HOSPITAL_COMMUNITY): Payer: Medicare Other

## 2022-04-29 ENCOUNTER — Emergency Department (HOSPITAL_COMMUNITY)
Admission: EM | Admit: 2022-04-29 | Discharge: 2022-04-30 | Disposition: A | Discharge status: Home / Self Care | Payer: Medicare Other | Attending: Emergency Medicine | Admitting: Emergency Medicine

## 2022-04-29 ENCOUNTER — Other Ambulatory Visit: Payer: Self-pay

## 2022-04-29 ENCOUNTER — Encounter (HOSPITAL_COMMUNITY): Payer: Self-pay | Admitting: Pharmacy Technician

## 2022-04-29 DIAGNOSIS — R079 Chest pain, unspecified: Secondary | ICD-10-CM | POA: Diagnosis present

## 2022-04-29 DIAGNOSIS — Z79899 Other long term (current) drug therapy: Secondary | ICD-10-CM | POA: Insufficient documentation

## 2022-04-29 DIAGNOSIS — I10 Essential (primary) hypertension: Secondary | ICD-10-CM | POA: Insufficient documentation

## 2022-04-29 LAB — CBC WITH DIFFERENTIAL/PLATELET
Abs Immature Granulocytes: 0.03 10*3/uL (ref 0.00–0.07)
Basophils Absolute: 0 10*3/uL (ref 0.0–0.1)
Basophils Relative: 0 %
Eosinophils Absolute: 0.3 10*3/uL (ref 0.0–0.5)
Eosinophils Relative: 5 %
HCT: 38.3 % — ABNORMAL LOW (ref 39.0–52.0)
Hemoglobin: 12.4 g/dL — ABNORMAL LOW (ref 13.0–17.0)
Immature Granulocytes: 0 %
Lymphocytes Relative: 18 %
Lymphs Abs: 1.3 10*3/uL (ref 0.7–4.0)
MCH: 31.2 pg (ref 26.0–34.0)
MCHC: 32.4 g/dL (ref 30.0–36.0)
MCV: 96.2 fL (ref 80.0–100.0)
Monocytes Absolute: 0.6 10*3/uL (ref 0.1–1.0)
Monocytes Relative: 8 %
Neutro Abs: 5 10*3/uL (ref 1.7–7.7)
Neutrophils Relative %: 69 %
Platelets: 199 10*3/uL (ref 150–400)
RBC: 3.98 MIL/uL — ABNORMAL LOW (ref 4.22–5.81)
RDW: 14.3 % (ref 11.5–15.5)
WBC: 7.3 10*3/uL (ref 4.0–10.5)
nRBC: 0 % (ref 0.0–0.2)

## 2022-04-29 LAB — BASIC METABOLIC PANEL
Anion gap: 7 (ref 5–15)
BUN: 23 mg/dL (ref 8–23)
CO2: 28 mmol/L (ref 22–32)
Calcium: 9.6 mg/dL (ref 8.9–10.3)
Chloride: 107 mmol/L (ref 98–111)
Creatinine, Ser: 1.14 mg/dL (ref 0.61–1.24)
GFR, Estimated: >60 mL/min (ref >60)
Glucose, Bld: 130 mg/dL — ABNORMAL HIGH (ref 70–99)
Potassium: 4.1 mmol/L (ref 3.5–5.1)
Sodium: 142 mmol/L (ref 135–145)

## 2022-04-29 LAB — TROPONIN I (HIGH SENSITIVITY)
Troponin I (High Sensitivity): 32 ng/L — ABNORMAL HIGH (ref <18)
Troponin I (High Sensitivity): 31 ng/L — ABNORMAL HIGH (ref <18)

## 2022-04-29 NOTE — ED Triage Notes (Signed)
Pt here POV with reports of sudden onset of L shoulder numbness which resolved and then he developed severe L sided chest pain with shob. Pain is non radiating.

## 2022-04-29 NOTE — ED Provider Triage Note (Signed)
Emergency Medicine Provider Triage Evaluation Note  Derek Washington , a 81 y.o. male  was evaluated in triage.  Pt complains of pain.  Began earlier today.  He was out walking and felt fatigued.  He went home.  Developed sudden onset left-sided chest pain, shortness of breath.  Tingling to his arm.  No back pain.  No numbness or weakness.  No lower extremity pain or swelling.  Chronically anticoagulated for aortic valve replacement  Review of Systems  Positive: CP, SOB Negative: Le swelling, fever  Physical Exam  BP (!) 164/84   Pulse 64   Temp 97.9 F (36.6 C)   Resp 20   SpO2 97%  Gen:   Awake, no distress   Resp:  Normal effort  MSK:   Moves extremities without difficulty  Other:    Medical Decision Making  Medically screening exam initiated at 2:12 PM.  Appropriate orders placed.  [name] was informed that the remainder of the evaluation will be completed by another provider, this initial triage assessment does not replace that evaluation, and the importance of remaining in the ED until their evaluation is complete.  CP, SOB   Javier Gell A, PA-C 04/29/22 1413

## 2022-04-30 ENCOUNTER — Encounter (HOSPITAL_COMMUNITY): Payer: Self-pay

## 2022-04-30 LAB — TROPONIN I (HIGH SENSITIVITY): Troponin I (High Sensitivity): 31 ng/L — ABNORMAL HIGH (ref <18)

## 2022-04-30 MED ORDER — IRBESARTAN 300 MG PO TABS: 150.0000 mg | ORAL_TABLET | Freq: Every day | ORAL | Status: DC

## 2022-04-30 MED ORDER — CARVEDILOL 3.125 MG PO TABS
6.2500 mg | ORAL_TABLET | Freq: Two times a day (BID) | ORAL | Status: DC
  Filled 2022-04-30: qty 2

## 2022-04-30 MED ORDER — HYDRALAZINE HCL 20 MG/ML IJ SOLN
5.0000 mg | Freq: Once | INTRAMUSCULAR | Status: AC
  Administered 2022-04-30: 5 mg via INTRAVENOUS
  Filled 2022-04-30: qty 1

## 2022-04-30 MED ORDER — APIXABAN 5 MG PO TABS
5.0000 mg | ORAL_TABLET | Freq: Two times a day (BID) | ORAL | Status: DC
  Administered 2022-04-30: 5 mg via ORAL
  Filled 2022-04-30: qty 1

## 2022-04-30 MED ORDER — AMLODIPINE BESYLATE 5 MG PO TABS
5.0000 mg | ORAL_TABLET | Freq: Once | ORAL | Status: AC
  Administered 2022-04-30: 5 mg via ORAL
  Filled 2022-04-30: qty 1

## 2022-04-30 NOTE — Discharge Instructions (Signed)
Your work up in the ED is reassuring. We recommend follow up with your cardiology. Have your blood pressure rechecked by your primary care doctor within the week. Return for any new or concerning symptoms.

## 2022-04-30 NOTE — ED Provider Notes (Signed)
[facility] EMERGENCY DEPARTMENT Provider Note   CSN: [id number] Arrival date & time: 04/29/22  1404     History  Chief Complaint  Patient presents with   Chest Pain    Derek Washington is a 81 y.o. male.  81 y/o male with hx of HTN, HLD, severe aortic stenosis s/p AVR, heart block s/p PPM presents to the ED for evaluation of chest pain. States that he was sitting when he noted an aching in his L arm at 1330 which improved with some movement; reports he "shook it out". Shortly after patient began having a sharp, aching pain in his L chest. This lasted for 10 minutes and then improved slightly to an aching sensation. Discomfort completely resolved after 20 minutes. It has not recurred. He denies CP at this time. No associated hemoptysis, syncope/near syncope, leg swelling, N/V, diaphoresis, back pain, SOB, extremity numbness or weakness, fever. Patient chronically anticoagulated on Eliquis; denies missing any doses.       Home Medications Prior to Admission medications   Medication Sig Start Date End Date Taking? Authorizing Provider  acetaminophen (TYLENOL) 325 MG tablet Take 2 tablets (650 mg total) by mouth every 6 (six) hours as needed for mild pain (or Fever >/= 101). 02/06/22   Meredeth Ide, MD  amLODipine (NORVASC) 5 MG tablet Take 1 tablet (5 mg total) by mouth every evening. 02/06/22   Meredeth Ide, MD  apixaban (ELIQUIS) 5 MG TABS tablet Take 1 tablet (5 mg total) by mouth 2 (two) times daily. 03/09/22   [name], MD  atorvastatin (LIPITOR) 20 MG tablet Take 20 mg by mouth every evening. 11/15/15   [provider]  carvedilol (COREG) 6.25 MG tablet Take 1 tablet (6.25 mg total) by mouth 2 (two) times daily. 04/24/22   [name], MD  cholecalciferol (VITAMIN D) 25 MCG (1000 UNIT) tablet Take 1,000 Units by mouth in the morning.    [provider]  finasteride (PROSCAR) 5 MG tablet Take 5 mg by mouth in the morning. 11/15/15    [provider]  hydrALAZINE (APRESOLINE) 50 MG tablet Take 1 tablet (50 mg total) by mouth 3 (three) times daily. 02/06/22   Meredeth Ide, MD  Multiple Vitamin (MULTIVITAMIN WITH MINERALS) TABS tablet Take 1 tablet by mouth in the morning.    [provider]  Omega-3 Fatty Acids (FISH OIL) 1000 MG CAPS Take 1,000 mg by mouth 2 (two) times daily.    [provider]  telmisartan (MICARDIS) 40 MG tablet Take 40 mg by mouth daily. 04/12/22   [provider]      Allergies    Iodinated contrast media and Hydrochlorothiazide    Review of Systems   Review of Systems Ten systems reviewed and are negative for acute change, except as noted in the HPI.    Physical Exam Updated Vital Signs BP (!) 165/93 (BP Location: Right Arm)   Pulse 69   Temp 97.7 F (36.5 C) (Oral)   Resp 14   SpO2 99%   Physical Exam Vitals and nursing note reviewed.  Constitutional:      General: He is not in acute distress.    Appearance: He is well-developed. He is not diaphoretic.     Comments: Alert, nontoxic. Pleasant caucasian male.  HENT:     Head: Normocephalic and atraumatic.  Eyes:     General: No scleral icterus.    Conjunctiva/sclera: Conjunctivae normal.  Cardiovascular:  Rate and Rhythm: Normal rate and regular rhythm.     Pulses: Normal pulses.     Heart sounds: Murmur heard.  Pulmonary:     Effort: Pulmonary effort is normal. No respiratory distress.     Breath sounds: No stridor. No wheezing, rhonchi or rales.     Comments: Lungs CTAB. Respirations even and unlabored. Musculoskeletal:        General: Normal range of motion.     Cervical back: Normal range of motion.     Comments: No BLE edema.  Skin:    General: Skin is warm and dry.     Coloration: Skin is not pale.     Findings: No erythema or rash.  Neurological:     Mental Status: He is alert and oriented to person, place, and time.  Psychiatric:        Behavior: Behavior normal.     ED  Results / Procedures / Treatments   Labs (all labs ordered are listed, but only abnormal results are displayed) Labs Reviewed  CBC WITH DIFFERENTIAL/PLATELET - Abnormal; Notable for the following components:      Result Value   RBC 3.98 (*)    Hemoglobin 12.4 (*)    HCT 38.3 (*)    All other components within normal limits  BASIC METABOLIC PANEL - Abnormal; Notable for the following components:   Glucose, Bld 130 (*)    All other components within normal limits  TROPONIN I (HIGH SENSITIVITY) - Abnormal; Notable for the following components:   Troponin I (High Sensitivity) 32 (*)    All other components within normal limits  TROPONIN I (HIGH SENSITIVITY) - Abnormal; Notable for the following components:   Troponin I (High Sensitivity) 31 (*)    All other components within normal limits  TROPONIN I (HIGH SENSITIVITY) - Abnormal; Notable for the following components:   Troponin I (High Sensitivity) 31 (*)    All other components within normal limits    EKG EKG Interpretation  Date/Time:  Sunday April 29 2022 17:03:02 EST Ventricular Rate:  69 PR Interval:  244 QRS Duration: 172 QT Interval:  478 QTC Calculation: 512 R Axis:   54 Text Interpretation: Atrial-sensed ventricular-paced rhythm with prolonged AV conduction Abnormal ECG When compared with ECG of 29-Apr-2022 14:14, PREVIOUS ECG IS PRESENT Confirmed by Marily Memos [phone]) on 04/30/2022 1:23:26 AM  Radiology DG Chest 2 View  Result Date: 04/29/2022 CLINICAL DATA:  Sudden onset of left shoulder numbness which was followed by left-sided chest pain and shortness of breath. EXAM: CHEST - 2 VIEW COMPARISON:  02/04/2022.  CT, 01/22/2022. FINDINGS: Cardiac silhouette mildly enlarged. Stable changes from prior cardiac surgery. Stable left anterior chest wall sequential pacemaker. No mediastinal or hilar masses. No evidence of adenopathy. Clear lungs.  No pleural effusion or pneumothorax. Skeletal structures are intact.  IMPRESSION: No acute cardiopulmonary disease. Electronically Signed   By: Amie Portland M.D.   On: 04/29/2022 14:52    Procedures Procedures    Medications Ordered in ED Medications  apixaban (ELIQUIS) tablet 5 mg (5 mg Oral Given 04/30/22 0200)  carvedilol (COREG) tablet 6.25 mg (has no administration in time range)  irbesartan (AVAPRO) tablet 150 mg (has no administration in time range)  hydrALAZINE (APRESOLINE) injection 5 mg (5 mg Intravenous Given 04/30/22 0201)  amLODipine (NORVASC) tablet 5 mg (5 mg Oral Given 04/30/22 0200)    ED Course/ Medical Decision Making/ A&P Clinical Course as of 04/30/22 0422  Mon Apr 30, 2022  0126 RN  interrogated pacemaker. Awaiting faxed results. [KH]  0127 Troponin level 32, 31 on recheck. Although mildly elevated, delta is flat compared to initial results suggesting this to be the patient's baseline. May be related to his HTN in the department or his hx of AVR and heart block. The patient has been unable to take his prescribed nightly medication since he has been in the waiting room for 11 hours. [KH]  0129 CXR viewed and interpreted; negative for acute cardiopulmonary abnormality. No PTX, PNA, mediastinal widening. [KH]  0139 Patient seen by MD Mesner. Plan for third troponin. If value remains flat and [employer] report is reassuring, will proceed with plan for outpatient cardiology f/u. [KH]  0202 [employer] rep called to report that battery and leads are OK. Since 03/07/22 patient had 1 episode of nonsustained VTach on 04/26/22. No episodes today associated with patient's symptoms. [KH]  0315 Repeat troponin remains flat at 31. Patient continues to be asymptomatic. BP has improved with meds. [KH]    Clinical Course User Index [KH] Antony Madura, PA-C                           Medical Decision Making Risk Prescription drug management.   This patient presents to the ED for concern of chest pain, this involves an extensive number of treatment  options, and is a complaint that carries with it a high risk of complications and morbidity.  The differential diagnosis includes ACS vs PNA vs PTX vs pleural effusion vs CHF vs MSK   Co morbidities that complicate the patient evaluation  HTN HLD Aortic stenosis   Additional history obtained:  Additional history obtained from wife at bedside External records from outside source obtained and reviewed including outpatient cardiology visit on 04/24/22   Lab Tests:  I Ordered, and personally interpreted labs.  The pertinent results include:  Hgb 12.4 (stable), Troponin 32 > 31 > 31   Imaging Studies ordered:  I ordered imaging studies including CXR  I independently visualized and interpreted imaging which showed no acute cardiopulmonary process I agree with the radiologist interpretation   Cardiac Monitoring:  The patient was maintained on a cardiac monitor.  I personally viewed and interpreted the cardiac monitored which showed an underlying rhythm of: NSR   Medicines ordered and prescription drug management:  I ordered medication including Hydralazine and Norvasc for HTN; Eliquis for medication compliance.  Reevaluation of the patient after these medicines showed that the patient improved I have reviewed the patients home medicines and have made adjustments as needed   Test Considered:  BNP   Problem List / ED Course:  As above   Reevaluation:  After the interventions noted above, I reevaluated the patient and found that they have :improved   Social Determinants of Health:  Insured patient Established with Cardiology   Dispostion:  After consideration of the diagnostic results and the patients response to treatment, I feel that the patent would benefit from outpatient cardiology follow up and BP recheck with his PCP within the week. Return precautions discussed and provided. Patient discharged in stable condition with no unaddressed concerns.           Final Clinical Impression(s) / ED Diagnoses Final diagnoses:  Nonspecific chest pain    Rx / DC Orders ED Discharge Orders     None         Antony Madura, PA-C 04/30/22 0436    Mesner, Barbara Cower, MD 04/30/22 [phone]

## 2022-05-02 ENCOUNTER — Encounter: Payer: Self-pay | Admitting: Interventional Cardiology

## 2022-05-02 ENCOUNTER — Ambulatory Visit: Payer: Medicare Other | Attending: Interventional Cardiology | Admitting: Interventional Cardiology

## 2022-05-02 VITALS — BP 120/70 | HR 60 | Ht 72.0 in | Wt 225.0 lb

## 2022-05-02 DIAGNOSIS — Z952 Presence of prosthetic heart valve: Secondary | ICD-10-CM

## 2022-05-02 DIAGNOSIS — R079 Chest pain, unspecified: Secondary | ICD-10-CM | POA: Diagnosis not present

## 2022-05-02 DIAGNOSIS — I5022 Chronic systolic (congestive) heart failure: Secondary | ICD-10-CM

## 2022-05-02 DIAGNOSIS — I10 Essential (primary) hypertension: Secondary | ICD-10-CM

## 2022-05-02 MED ORDER — HYDRALAZINE HCL 50 MG PO TABS: 75.0000 mg | ORAL_TABLET | Freq: Three times a day (TID) | ORAL | 3 refills | Status: DC

## 2022-05-02 NOTE — Progress Notes (Signed)
Cardiology Office Note   Date:  05/02/2022   ID:  Derek Washington, DOB 10/08/1940, MRN [id number]  PCP:  Kathalene Frames, MD    No chief complaint on file.  S/p AVR  Wt Readings from Last 3 Encounters:  05/02/22 225 lb (102.1 kg)  04/24/22 226 lb (102.5 kg)  03/09/22 223 lb (101.2 kg)       History of Present Illness: Derek Washington is a 81 y.o. male   with a history of bicuspid aortic valve, aortic stenosis and aortic regurgitation.    He moved to [place] from [place].     He reported more shortness of breath in 2022. Echo showed Aortic valve Mean gradient increased to 36 mm.  Ao root was 44.  Had some near syncope at that time.  He eventually had cath and AVR.  Due to persistent bradycardia, he had a pacer placed.  He had atrial flutter as well.  Eliquis was started due to high burden of AFib.  In 04/2021: "Had some dizziness with turning his head a certain way.  He feels like the room is spinning.  Stamina has improved greatly.  He thinks the aortic stenosis was likely bothering him for several months prior to his surgery but he just avoided activities that cause symptoms.  Now he is walking up hills without any difficulty."   Still does Epley maneuvers.     He had ARF in 2023.  Needed dialysis transiently. 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 Plan to repeat in 1 year. No additional changes were made to treatment plan and follow-up in 6 months was recommended.   Admission 8/15-8/22/23 for AKI after having lab work with his PCP due to fatigue, generalized weakness.  Significant acute kidney injury with creatinine of 11.61 and potassium of 6.1 upon arrival to hospital.  Additionally was found to have kidney stones.  Discharge creatinine was 2.46.  He had lithotripsy. He passed some.     He contacted our office on 02/14/2022 to report shortness of breath when walking the previous few days.  Symptoms  improved with rest.   Echo in 9/23: "Left ventricular ejection fraction, by estimation, is 40 to 45%. Left  ventricular ejection fraction by 3D volume is 40 %. The left ventricle has  mildly decreased function. The left ventricle demonstrates global  hypokinesis. The aortic valve has been repaired/replaced. Aortic valve  regurgitation is not visualized. There is a 25 mm bioprosthetic valve  present in the aortic position. Aortic valve mean gradient measures 16.5  mmHg. Aortic valve Vmax measures 2.71 m/s.   6. 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    In September 2023, he reported some episodic dyspnea exertion.  Activities such as taking a shower would cause some shortness of breath.  He was doing well with it.  He was also having some occasional high blood pressure readings.  Renal failure limited choice of blood pressure medicines.    After the addition of Coreg, he has felt better.  He had a left foot problem.  Treated for gout, with prednisone and an antibiotic.  This resolved.  Feels good walking.     In 11/23, BP has been up and down at home.  Highest reading was 160s/90s.   Went to ER for chest pain.  Had troponin in the 30s, flat trend.  15 minutes of chest and left arm pain/numbness.  Very  severe for that short time. Sx had resolved after the drive to the hospital.      Past Medical History:  Diagnosis Date   Bicuspid aortic valve    LAST ECHOCARDIOGRAM APPROXIMATELY 2016? MILD AORTIC REGURG   BPH (benign prostatic hyperplasia)    Hard of hearing    Heart murmur    High cholesterol    Hydrocele    Hypertension    Insomnia    Low back pain    RIGHT   Mild obesity    Nocturia    Presence of permanent cardiac pacemaker    Right elbow tendonitis    Tinnitus     Past Surgical History:  Procedure Laterality Date   AORTIC VALVE REPLACEMENT N/A 12/01/2020   Procedure: AORTIC VALVE REPLACEMENT (AVR) USING INSPIRIS 25MM AORTIC VALVE;  Surgeon:  Gaye Pollack, MD;  Location: [facility];  Service: Open Heart Surgery;  Laterality: N/A;   CYSTOSCOPY WITH URETEROSCOPY, STONE BASKETRY AND STENT PLACEMENT Left 02/05/2022   Procedure: CYSTOSCOPY WITH LEFT URERETAL STENT PLACEMENT, RETROGRADE PYELOGRAM, URETEROSCOPY WITH STONE EXTRACTION, LASER LITHOTRIPSY;  Surgeon: Raynelle Bring, MD;  Location: [facility];  Service: Urology;  Laterality: Left;   HYDROCELE EXCISION  2017   DR. [name] 2017   IR FLUORO GUIDE CV LINE RIGHT  02/01/2022   IR US GUIDE VASC ACCESS RIGHT  02/01/2022   JOINT REPLACEMENT     left total knee replacement      2007    PACEMAKER IMPLANT N/A 12/07/2020   Procedure: PACEMAKER IMPLANT;  Surgeon: Deboraha Sprang, MD;  Location: [facility];  Service: Cardiovascular;  Laterality: N/A;   right knee total replacement      2011   RIGHT/LEFT HEART CATH AND CORONARY ANGIOGRAPHY N/A 11/03/2020   Procedure: RIGHT/LEFT HEART CATH AND CORONARY ANGIOGRAPHY;  Surgeon: Lorretta Harp, MD;  Location: [facility];  Service: Cardiovascular;  Laterality: N/A;   SCROTAL EXPLORATION N/A 12/26/2015   Procedure: SCROTUM EXPLORATION;  Surgeon: Raynelle Bring, MD;  Location: [facility];  Service: Urology;  Laterality: N/A;  EXCISION OF LEFT EPIDIDYMAL CYST/SPERMATOCELE   TEE WITHOUT CARDIOVERSION N/A 12/01/2020   Procedure: TRANSESOPHAGEAL ECHOCARDIOGRAM (TEE);  Surgeon: Gaye Pollack, MD;  Location: [facility];  Service: Open Heart Surgery;  Laterality: N/A;   TONSILLECTOMY     VASECTOMY     1968     Current Outpatient Medications  Medication Sig Dispense Refill   acetaminophen (TYLENOL) 325 MG tablet Take 2 tablets (650 mg total) by mouth every 6 (six) hours as needed for mild pain (or Fever >/= 101).     amLODipine (NORVASC) 5 MG tablet Take 1 tablet (5 mg total) by mouth every evening. 30 tablet 3   apixaban (ELIQUIS) 5 MG TABS tablet Take 1 tablet (5 mg total) by mouth 2 (two) times daily. 180 tablet 3   atorvastatin (LIPITOR) 20  MG tablet Take 20 mg by mouth every evening.     carvedilol (COREG) 6.25 MG tablet Take 1 tablet (6.25 mg total) by mouth 2 (two) times daily. 180 tablet 3   cholecalciferol (VITAMIN D) 25 MCG (1000 UNIT) tablet Take 1,000 Units by mouth in the morning.     finasteride (PROSCAR) 5 MG tablet Take 5 mg by mouth in the morning.     hydrALAZINE (APRESOLINE) 50 MG tablet Take 1.5 tablets (75 mg total) by mouth 3 (three) times daily. 405 tablet 3   Multiple Vitamin (MULTIVITAMIN WITH MINERALS) TABS tablet  Take 1 tablet by mouth in the morning.     Omega-3 Fatty Acids (FISH OIL) 1000 MG CAPS Take 1,000 mg by mouth 2 (two) times daily.     telmisartan (MICARDIS) 40 MG tablet Take 40 mg by mouth daily.     No current facility-administered medications for this visit.    Allergies:   Iodinated contrast media and Hydrochlorothiazide    Social History:  The patient  reports that he has never smoked. He has never used smokeless tobacco. He reports current alcohol use. He reports that he does not use drugs.   Family History:  The patient's family history includes COPD (age of onset: 76) in his father; Other (age of onset: 63) in his mother and sister.    ROS:  Please see the history of present illness.   Otherwise, review of systems are positive for fall prior to his visit with me earlier this month where he hit the left side of his chest.   All other systems are reviewed and negative.    PHYSICAL EXAM: VS:  BP 120/70 (BP Location: Left Arm, Patient Position: Sitting, Cuff Size: Normal)   Pulse 60   Ht 6' (1.829 m)   Wt 225 lb (102.1 kg)   SpO2 97%   BMI 30.52 kg/m  , BMI Body mass index is 30.52 kg/m. GEN: Well nourished, well developed, in no acute distress HEENT: normal Neck: no JVD, carotid bruits, or masses Cardiac: RRR; no murmurs, rubs, or gallops,no edema  Respiratory:  clear to auscultation bilaterally, normal work of breathing GI: soft, nontender, nondistended, + BS MS: no deformity  or atrophy Skin: warm and dry, no rash Neuro:  Strength and sensation are intact Psych: euthymic mood, full affect   EKG:   The ekg ordered today demonstrates atrial sensed V pacing   Recent Labs: 01/30/2022: B Natriuretic Peptide 478.3 01/31/2022: ALT 24 02/01/2022: Magnesium 2.3 04/29/2022: BUN 23; Creatinine, Ser 1.14; Hemoglobin 12.4; Platelets 199; Potassium 4.1; Sodium 142   Lipid Panel No results found for: "CHOL", "TRIG", "HDL", "CHOLHDL", "VLDL", "LDLCALC", "LDLDIRECT"   Other studies Reviewed: Additional studies/ records that were reviewed today with results demonstrating: ER records reviewed.   ASSESSMENT AND PLAN:  S/p AVR: No CHF.  Continue SBE prophylaxis. AFib: No symptoms of A-fib. Chronic systolic heart failure: He appears euvolemic. Hypertension/chest pain: He still has had spikes in his blood pressure.  Unsure if that could have been the cause of his chest pain that he had which prompted ER visit a few days ago.  3 troponins were all in the low 30s.  The flat trend is reassuring that that was not actually an acute coronary syndrome.  I personally reviewed the cath images.  He did have some mild luminal irregularities.  We discussed the risks and benefits of repeat catheterization.  The biggest concern is the fact that he had acute renal failure with a CT scan that used 75 cc of Omnipaque earlier this year.  He required dialysis temporarily.  His renal function has returned to normal.  I am hesitant, and the patient is hesitant to give him more contrast dye unless absolutely necessary.  We will increase hydralazine to 75 mg 3 times daily as when I checked his blood pressure today, it was 148/68.  Hopefully, no further symptoms will occur if we get his blood pressure better controlled.  He did have to decrease amlodipine from 10 mg to 5 mg due to foot swelling.  Continue with plans for  echocardiogram in January.  He will let us know how his blood pressure does at  home.   Current medicines are reviewed at length with the patient today.  The patient concerns regarding his medicines were addressed.  The following changes have been made:  as above  Labs/ tests ordered today include:  No orders of the defined types were placed in this encounter.   Recommend 150 minutes/week of aerobic exercise Low fat, low carb, high fiber diet recommended  Disposition:   FU in Jan as scheduled   Signed, Larae Grooms, MD  05/02/2022 11:20 AM    [facility] [address], [place], [place]  [address] Phone: [phone]; Fax: [phone]

## 2022-05-02 NOTE — Patient Instructions (Signed)
Medication Instructions:  Please  increase Hydralazine to 75 mg three times a day. Continue all other medications as listed.  *If you need a refill on your cardiac medications before your next appointment, please call your pharmacy*   Follow-Up: At [facility], you and your health needs are our priority.  As part of our continuing mission to provide you with exceptional heart care, we have created designated Provider Care Teams.  These Care Teams include your primary Cardiologist (physician) and Advanced Practice Providers (APPs -  Physician Assistants and Nurse Practitioners) who all work together to provide you with the care you need, when you need it.  We recommend signing up for the patient portal called "MyChart".  Sign up information is provided on this After Visit Summary.  MyChart is used to connect with patients for Virtual Visits (Telemedicine).  Patients are able to view lab/test results, encounter notes, upcoming appointments, etc.  Non-urgent messages can be sent to your provider as well.   To learn more about what you can do with MyChart, go to [email or web address].    Your next appointment:   Follow up as scheduled.   Please keep check on your blood pressures.  You may send results through MyChart to Dr Eldridge Dace.   Important Information About Sugar

## 2022-05-07 ENCOUNTER — Encounter: Payer: Self-pay | Admitting: Interventional Cardiology

## 2022-05-15 ENCOUNTER — Encounter: Payer: Self-pay | Admitting: Interventional Cardiology

## 2022-05-15 DIAGNOSIS — I10 Essential (primary) hypertension: Secondary | ICD-10-CM

## 2022-05-15 DIAGNOSIS — Z79899 Other long term (current) drug therapy: Secondary | ICD-10-CM

## 2022-05-17 MED ORDER — TELMISARTAN 80 MG PO TABS: 80.0000 mg | ORAL_TABLET | Freq: Every day | ORAL | 3 refills | Status: DC

## 2022-05-17 MED ORDER — HYDRALAZINE HCL 50 MG PO TABS: 75.0000 mg | ORAL_TABLET | Freq: Two times a day (BID) | ORAL | 3 refills | Status: DC

## 2022-05-17 NOTE — Telephone Encounter (Signed)
[name], MD     Lets go back to hydralazine 75 mg BID. Increase telmisartan to 80 mg daily and PLan for BMet in one week.  JV

## 2022-05-24 ENCOUNTER — Ambulatory Visit: Payer: Medicare Other | Attending: Interventional Cardiology

## 2022-05-24 DIAGNOSIS — I10 Essential (primary) hypertension: Secondary | ICD-10-CM

## 2022-05-24 DIAGNOSIS — Z79899 Other long term (current) drug therapy: Secondary | ICD-10-CM

## 2022-05-24 LAB — BASIC METABOLIC PANEL
BUN/Creatinine Ratio: 20 (ref 10–24)
BUN: 22 mg/dL (ref 8–27)
CO2: 25 mmol/L (ref 20–29)
Calcium: 9.9 mg/dL (ref 8.6–10.2)
Chloride: 104 mmol/L (ref 96–106)
Creatinine, Ser: 1.1 mg/dL (ref 0.76–1.27)
Glucose: 100 mg/dL — ABNORMAL HIGH (ref 70–99)
Potassium: 4.2 mmol/L (ref 3.5–5.2)
Sodium: 143 mmol/L (ref 134–144)
eGFR: 68 mL/min/{1.73_m2} (ref >59)

## 2022-05-31 ENCOUNTER — Encounter: Payer: Self-pay | Admitting: Orthopaedic Surgery

## 2022-05-31 ENCOUNTER — Ambulatory Visit (INDEPENDENT_AMBULATORY_CARE_PROVIDER_SITE_OTHER): Payer: Medicare Other | Admitting: Orthopaedic Surgery

## 2022-05-31 DIAGNOSIS — R29898 Other symptoms and signs involving the musculoskeletal system: Secondary | ICD-10-CM

## 2022-05-31 NOTE — Progress Notes (Signed)
Gabriel Rung is well-known to me.  I know his family well and I know him well.  He is 81 years old.  He recently had had I believe kidney stones and a CT scan with contrast because of acute renal insufficiency.  After that he had significant left ankle and foot swelling and significant pain with that.  He was even hospitalized after the CT with contrast.  He is now ambulating well with no pain.  His primary care physician I believe are someone put him on some steroids for about a week and he said that helped quite a bit.  He still concerned about the weakness in his legs.  He has a harder time going up and down stairs.  He does go to a gym and workout on a regular basis.  On exam he has got good quad strength and hip strength bilaterally.  He does have weakness with dorsiflexion of his left foot which is normal on the right side but he is weak on the left side but is not a true foot drop in terms of he can bring his foot up but it is certainly weak showing some type of deficit in the nerves.  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  Of note his feet are well-perfused.  I want him to specifically work on calf and hamstring strengthening as well as quad strengthening.  I did show him some exercises for strengthening his lower extremities.  A lot of times this type of weakness will resolve.  He cannot have an MRI since he does have a defibrillator.  I did offer outpatient physical therapy but he does go to the gym regularly so hopefully this will help.  He does not need an ASO since his foot is clearing the ground and he can dorsiflex even though it is weak.  All questions and concerns were answered and addressed.

## 2022-06-05 ENCOUNTER — Encounter: Payer: Self-pay | Admitting: Interventional Cardiology

## 2022-06-05 DIAGNOSIS — Z79899 Other long term (current) drug therapy: Secondary | ICD-10-CM

## 2022-06-05 DIAGNOSIS — R0602 Shortness of breath: Secondary | ICD-10-CM

## 2022-06-06 ENCOUNTER — Ambulatory Visit (INDEPENDENT_AMBULATORY_CARE_PROVIDER_SITE_OTHER): Payer: Medicare Other

## 2022-06-06 ENCOUNTER — Ambulatory Visit: Payer: Medicare Other | Attending: Internal Medicine

## 2022-06-06 DIAGNOSIS — I442 Atrioventricular block, complete: Secondary | ICD-10-CM

## 2022-06-06 DIAGNOSIS — R0602 Shortness of breath: Secondary | ICD-10-CM

## 2022-06-06 LAB — CUP PACEART REMOTE DEVICE CHECK
Battery Remaining Longevity: 126 mo
Battery Voltage: 3.03 V
Brady Statistic AP VP Percent: 42.22 %
Brady Statistic AP VS Percent: 0.01 %
Brady Statistic AS VP Percent: 57.37 %
Brady Statistic AS VS Percent: 0.41 %
Brady Statistic RA Percent Paced: 42.51 %
Brady Statistic RV Percent Paced: 99.59 %
Date Time Interrogation Session: 20231220034503
Implantable Lead Connection Status: 753985
Implantable Lead Connection Status: 753985
Implantable Lead Implant Date: 20220622
Implantable Lead Implant Date: 20220622
Implantable Lead Location: 753859
Implantable Lead Location: 753860
Implantable Lead Model: 3830
Implantable Lead Model: 5076
Implantable Pulse Generator Implant Date: 20220622
Lead Channel Impedance Value: 228 Ohm
Lead Channel Impedance Value: 361 Ohm
Lead Channel Impedance Value: 418 Ohm
Lead Channel Impedance Value: 532 Ohm
Lead Channel Pacing Threshold Amplitude: 0.75 V
Lead Channel Pacing Threshold Amplitude: 1.25 V
Lead Channel Pacing Threshold Pulse Width: 0.4 ms
Lead Channel Pacing Threshold Pulse Width: 0.4 ms
Lead Channel Sensing Intrinsic Amplitude: 1.375 mV
Lead Channel Sensing Intrinsic Amplitude: 1.375 mV
Lead Channel Sensing Intrinsic Amplitude: 12 mV
Lead Channel Sensing Intrinsic Amplitude: 31.625 mV
Lead Channel Setting Pacing Amplitude: 2 V
Lead Channel Setting Pacing Amplitude: 2.5 V
Lead Channel Setting Pacing Pulse Width: 0.4 ms
Lead Channel Setting Sensing Sensitivity: 0.9 mV
Zone Setting Status: 755011

## 2022-06-06 NOTE — Telephone Encounter (Signed)
[name], MD     Lets see what the echo shows.  Lets also get a BMet and BNP today.  Can adjust diuretics if there is evidence of volume overload.    Has he ever had PFTs?    I spoke with patient and gave him message from Dr Eldridge Dace.  He does not think he has ever had PFTs.  He will stop by office today for lab work.

## 2022-06-07 LAB — BASIC METABOLIC PANEL
BUN/Creatinine Ratio: 18 (ref 10–24)
BUN: 19 mg/dL (ref 8–27)
CO2: 25 mmol/L (ref 20–29)
Calcium: 9.6 mg/dL (ref 8.6–10.2)
Chloride: 105 mmol/L (ref 96–106)
Creatinine, Ser: 1.07 mg/dL (ref 0.76–1.27)
Glucose: 99 mg/dL (ref 70–99)
Potassium: 4.4 mmol/L (ref 3.5–5.2)
Sodium: 142 mmol/L (ref 134–144)
eGFR: 70 mL/min/{1.73_m2} (ref >59)

## 2022-06-07 LAB — PRO B NATRIURETIC PEPTIDE: NT-Pro BNP: 1762 pg/mL — ABNORMAL HIGH (ref 0–486)

## 2022-06-07 MED ORDER — FUROSEMIDE 40 MG PO TABS: 40.0000 mg | ORAL_TABLET | Freq: Every day | ORAL | 3 refills | Status: DC

## 2022-06-07 MED ORDER — POTASSIUM CHLORIDE CRYS ER 20 MEQ PO TBCR: 20.0000 meq | EXTENDED_RELEASE_TABLET | Freq: Every day | ORAL | 3 refills | Status: DC

## 2022-06-07 NOTE — Telephone Encounter (Signed)
Derek Crafts, MD 06/07/2022  8:48 AM EST Back to Top    Normal kidney function and potassium.  Evidence of fluid overload that can cause shortness of breath.  Start Lasix 40 mg BID for 3 days and then 40 mg daily.  KCl 20 mEq daily. BMet next week.

## 2022-06-14 ENCOUNTER — Ambulatory Visit: Payer: Medicare Other | Attending: Interventional Cardiology

## 2022-06-14 ENCOUNTER — Ambulatory Visit: Payer: Medicare Other

## 2022-06-14 ENCOUNTER — Telehealth: Payer: Self-pay | Admitting: *Deleted

## 2022-06-14 DIAGNOSIS — Z952 Presence of prosthetic heart valve: Secondary | ICD-10-CM | POA: Diagnosis present

## 2022-06-14 DIAGNOSIS — Z79899 Other long term (current) drug therapy: Secondary | ICD-10-CM | POA: Insufficient documentation

## 2022-06-14 DIAGNOSIS — R0602 Shortness of breath: Secondary | ICD-10-CM

## 2022-06-14 DIAGNOSIS — Z950 Presence of cardiac pacemaker: Secondary | ICD-10-CM | POA: Insufficient documentation

## 2022-06-14 DIAGNOSIS — R0609 Other forms of dyspnea: Secondary | ICD-10-CM | POA: Diagnosis present

## 2022-06-14 LAB — BASIC METABOLIC PANEL
BUN/Creatinine Ratio: 25 — ABNORMAL HIGH (ref 10–24)
BUN: 29 mg/dL — ABNORMAL HIGH (ref 8–27)
CO2: 27 mmol/L (ref 20–29)
Calcium: 9.7 mg/dL (ref 8.6–10.2)
Chloride: 104 mmol/L (ref 96–106)
Creatinine, Ser: 1.16 mg/dL (ref 0.76–1.27)
Glucose: 104 mg/dL — ABNORMAL HIGH (ref 70–99)
Potassium: 4.2 mmol/L (ref 3.5–5.2)
Sodium: 144 mmol/L (ref 134–144)
eGFR: 64 mL/min/{1.73_m2} (ref >59)

## 2022-06-14 LAB — ECHOCARDIOGRAM COMPLETE
AR max vel: 1.71 cm2
AV Area VTI: 1.7 cm2
AV Area mean vel: 1.62 cm2
AV Mean grad: 16 mmHg
AV Peak grad: 28.1 mmHg
Ao pk vel: 2.65 m/s
Area-P 1/2: 2.48 cm2
S' Lateral: 3.7 cm

## 2022-06-14 NOTE — Telephone Encounter (Signed)
Patient notified of results. He reports breathing improved for first 2-3 days when taking lasix but after about 3-4 days shortness of breath went back to level it had been.

## 2022-06-14 NOTE — Telephone Encounter (Signed)
-----   Message from [name], MD sent at 06/14/2022  1:12 PM EST ----- EF unchanged at 40-45%.  Aortic valve function stable as well.  Is breathing better after starting Lasix?

## 2022-06-20 MED ORDER — FUROSEMIDE 40 MG PO TABS: 40.0000 mg | ORAL_TABLET | Freq: Two times a day (BID) | ORAL | 2 refills | Status: DC

## 2022-06-20 NOTE — Telephone Encounter (Signed)
I spoke with patient and reviewed lab results and instructions from Dr Irish Lack with him.  His weight fluctuates a few pounds.  On 1/1 it was 215 lbs, yesterday was 218 lbs.  He has not weighed yet today.  Patient will weigh first thing every morning after going to bathroom and in the same amount of clothing.  He will call in readings in about a week.  Patient reports shortness of breath is about the same as when he contacted [place] regarding this.  He did note improvement with increased dose of furosemide.  Also noted swelling in ankles decreased when taking furosemide twice daily.  He will start taking furosemide twice daily for several days and if shortness of breath improves he will go back to once daily.  He is aware he can increase back to twice daily if needed.  Patient will come in for lab work on 07/04/21.  He has an appointment with Dr Irish Lack on 1/22.  Patient reports eating more salt over the holidays.  He has now cut back on salt intake.

## 2022-06-20 NOTE — Telephone Encounter (Signed)
-----   Message from Jettie Booze, MD sent at 06/14/2022 10:39 PM EST ----- REnal function and potassium stable.  Can go back to Lasix 40 mg BID for now until he feels breathing is better.  Would also report daily weights back to [place] as well.  Repeat BNP, BMet in 2 weeks.

## 2022-06-27 ENCOUNTER — Telehealth: Payer: Self-pay | Admitting: Interventional Cardiology

## 2022-06-27 NOTE — Telephone Encounter (Signed)
I called him to discuss PYP scan but got his voicemail on home and cell phones.  I would like or him to have a PYP scan for chronic systolic/diastolic heart failure.  This will tell us if amyloid could be affecting his heart function since it did not improve on the more recent echo.  THere is a medicine to treat amyloid if that turns out to be the case.  People with aortic stenosis are more likely to get amyloid in the heart, so he is a candidate.  OK to move up his appt to this Friday on my quarter day.  He can be added on at 10AM if that works for him if he wants to discus further.  Thanks.  JV

## 2022-06-27 NOTE — Progress Notes (Signed)
Cardiology Office Note   Date:  06/29/2022   ID:  Derek Washington, DOB Nov 28, 1940, MRN [id number]  PCP:  Kathalene Frames, MD    No chief complaint on file.  Shortness of breath  Wt Readings from Last 3 Encounters:  06/29/22 215 lb 6.4 oz (97.7 kg)  05/02/22 225 lb (102.1 kg)  04/24/22 226 lb (102.5 kg)       History of Present Illness: Derek Washington is a 82 y.o. male  with a history of bicuspid aortic valve, aortic stenosis and aortic regurgitation.    He moved to [place] from [place].     He reported more shortness of breath in 2022. Echo showed Aortic valve Mean gradient increased to 36 mm.  Ao root was 44.  Had some near syncope at that time.  He eventually had cath and AVR.  Due to persistent bradycardia, he had a pacer placed.  He had atrial flutter as well.  Eliquis was started due to high burden of AFib.  In 04/2021: "Had some dizziness with turning his head a certain way.  He feels like the room is spinning.  Stamina has improved greatly.  He thinks the aortic stenosis was likely bothering him for several months prior to his surgery but he just avoided activities that cause symptoms.  Now he is walking up hills without any difficulty."   Still does Epley maneuvers.     He had ARF in 2023.  Needed dialysis transiently. 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 Plan to repeat in 1 year. No additional changes were made to treatment plan and follow-up in 6 months was recommended.   Admission 8/15-8/22/23 for AKI after having lab work with his PCP due to fatigue, generalized weakness.  Significant acute kidney injury with creatinine of 11.61 and potassium of 6.1 upon arrival to hospital.  Additionally was found to have kidney stones.  Discharge creatinine was 2.46.  He had lithotripsy. He passed some.     He contacted our office on 02/14/2022 to report shortness of breath when walking the previous few days.   Symptoms improved with rest.  In September 2023, he reported some episodic dyspnea exertion.  Activities such as taking a shower would cause some shortness of breath.  He was doing well with it.  He was also having some occasional high blood pressure readings.  Renal failure limited choice of blood pressure medicines.     Echo in 9/23: "Left ventricular ejection fraction, by estimation, is 40 to 45%. Repeat echo in 12/23 was similar despite medical therapy intensified.  Meds had also been increased for BP control.   After the addition of Coreg, he has felt better.  He had a left foot problem.  Treated for gout, with prednisone and an antibiotic.  This resolved.  Feels good walking.     In 11/23, BP has been up and down at home.  Highest reading was 160s/90s.    Went to ER for chest pain.  Had troponin in the 30s, flat trend.  15 minutes of chest and left arm pain/numbness.  Very severe for that short time. Sx had resolved after the drive to the hospital.    Still has [facility], initially when taking the Lasix twice, shortness of breath had improved.  Leg swelling improved.  Started taking it once a day and sx got worse.  Tried to resume BID Lasix and sx did not improve.  He  went back to once a day.  Feels feeling short of breath at night, but does not have to sit up.  Swelling has improved since starting the Lasix.  Weight has recent as well.  Unfortunately, his symptoms have not improved.  He also notices some stiffness in his legs, worse on his left leg.  He describes it as the feeling of walking while wearing a " tight long dress."  He does note some change in his balance.  No tremors.  Denies : Chest pain. Dizziness. Leg edema. Nitroglycerin use. Orthopnea. Palpitations. Paroxysmal nocturnal dyspnea. Syncope.      Past Medical History:  Diagnosis Date   Bicuspid aortic valve    LAST ECHOCARDIOGRAM APPROXIMATELY 2016? MILD AORTIC REGURG   BPH (benign prostatic hyperplasia)    Hard of hearing     Heart murmur    High cholesterol    Hydrocele    Hypertension    Insomnia    Low back pain    RIGHT   Mild obesity    Nocturia    Presence of permanent cardiac pacemaker    Right elbow tendonitis    Tinnitus     Past Surgical History:  Procedure Laterality Date   AORTIC VALVE REPLACEMENT N/A 12/01/2020   Procedure: AORTIC VALVE REPLACEMENT (AVR) USING INSPIRIS AORTIC VALVE;  Surgeon: [name], MD;  Location: [facility] OR;  Service: Open Heart Surgery;  Laterality: N/A;   CYSTOSCOPY WITH URETEROSCOPY, STONE BASKETRY AND STENT PLACEMENT Left 02/05/2022   Procedure: CYSTOSCOPY WITH LEFT URERETAL STENT PLACEMENT, RETROGRADE PYELOGRAM, URETEROSCOPY WITH STONE EXTRACTION, LASER LITHOTRIPSY;  Surgeon: [name], MD;  Location: [facility] ORS;  Service: Urology;  Laterality: Left;   HYDROCELE EXCISION  2017   DR. [name] 2017   IR FLUORO GUIDE CV LINE RIGHT  02/01/2022   IR US GUIDE VASC ACCESS RIGHT  02/01/2022   JOINT REPLACEMENT     left total knee replacement      2007    PACEMAKER IMPLANT N/A 12/07/2020   Procedure: PACEMAKER IMPLANT;  Surgeon: [name], MD;  Location: [facility] INVASIVE CV LAB;  Service: Cardiovascular;  Laterality: N/A;   right knee total replacement      2011   RIGHT/LEFT HEART CATH AND CORONARY ANGIOGRAPHY N/A 11/03/2020   Procedure: RIGHT/LEFT HEART CATH AND CORONARY ANGIOGRAPHY;  Surgeon: [name], MD;  Location: [facility] INVASIVE CV LAB;  Service: Cardiovascular;  Laterality: N/A;   SCROTAL EXPLORATION N/A 12/26/2015   Procedure: SCROTUM EXPLORATION;  Surgeon: [name], MD;  Location: [facility] ORS;  Service: Urology;  Laterality: N/A;  EXCISION OF LEFT EPIDIDYMAL CYST/SPERMATOCELE   TEE WITHOUT CARDIOVERSION N/A 12/01/2020   Procedure: TRANSESOPHAGEAL ECHOCARDIOGRAM (TEE);  Surgeon: [name], MD;  Location: [facility] OR;  Service: Open Heart Surgery;  Laterality: N/A;   TONSILLECTOMY     VASECTOMY     1968     Current Outpatient Medications   Medication Sig Dispense Refill   acetaminophen (TYLENOL) 325 MG tablet Take 2 tablets (650 mg total) by mouth every 6 (six) hours as needed for mild pain (or Fever >/= 101).     amLODipine (NORVASC) 5 MG tablet Take 1 tablet (5 mg total) by mouth every evening. 30 tablet 3   apixaban (ELIQUIS) 5 MG TABS tablet Take 1 tablet (5 mg total) by mouth 2 (two) times daily. 180 tablet 3   atorvastatin (LIPITOR) 20 MG tablet Take 20 mg by mouth every evening.     carvedilol (COREG)  6.25 MG tablet Take 1 tablet (6.25 mg total) by mouth 2 (two) times daily. 180 tablet 3   cholecalciferol (VITAMIN D) 25 MCG (1000 UNIT) tablet Take 1,000 Units by mouth in the morning.     finasteride (PROSCAR) 5 MG tablet Take 5 mg by mouth in the morning.     furosemide (LASIX) 40 MG tablet Take 1 tablet (40 mg total) by mouth 2 (two) times daily. (Patient taking differently: Take 40 mg by mouth daily.) 180 tablet 2   hydrALAZINE (APRESOLINE) 50 MG tablet Take 1.5 tablets (75 mg total) by mouth 2 (two) times daily. 270 tablet 3   metoprolol succinate (TOPROL-XL) 25 MG 24 hr tablet Take 25 mg by mouth daily.     Multiple Vitamin (MULTIVITAMIN WITH MINERALS) TABS tablet Take 1 tablet by mouth in the morning.     Omega-3 Fatty Acids (FISH OIL) 1000 MG CAPS Take 1,000 mg by mouth 2 (two) times daily.     potassium chloride SA (KLOR-CON M) 20 MEQ tablet Take 1 tablet (20 mEq total) by mouth daily. 90 tablet 3   telmisartan (MICARDIS) 80 MG tablet Take 1 tablet (80 mg total) by mouth daily. 90 tablet 3   No current facility-administered medications for this visit.    Allergies:   Iodinated contrast media and Hydrochlorothiazide    Social History:  The patient  reports that he has never smoked. He has never used smokeless tobacco. He reports current alcohol use. He reports that he does not use drugs.   Family History:  The patient's family history includes COPD (age of onset: 79) in his father; Other (age of onset: 32) in his  mother and sister.    ROS:  Please see the history of present illness.   Otherwise, review of systems are positive for leg stiffness.   All other systems are reviewed and negative.    PHYSICAL EXAM: VS:  BP (!) 142/78   Pulse 84   Ht 6' (1.829 m)   Wt 215 lb 6.4 oz (97.7 kg)   SpO2 97%   BMI 29.21 kg/m  , BMI Body mass index is 29.21 kg/m. GEN: Well nourished, well developed, in no acute distress HEENT: normal Neck: no JVD, carotid bruits, or masses Cardiac: RRR; no murmurs, rubs, or gallops,no edema  Respiratory:  clear to auscultation bilaterally, normal work of breathing GI: soft, nontender, nondistended, + BS MS: no deformity or atrophy Skin: warm and dry, no rash Neuro:  Strength and sensation are intact Psych: euthymic mood, full affect    Recent Labs: 01/30/2022: B Natriuretic Peptide 478.3 01/31/2022: ALT 24 02/01/2022: Magnesium 2.3 04/29/2022: Hemoglobin 12.4; Platelets 199 06/06/2022: NT-Pro BNP 1,762 06/14/2022: BUN 29; Creatinine, Ser 1.16; Potassium 4.2; Sodium 144   Lipid Panel No results found for: "CHOL", "TRIG", "HDL", "CHOLHDL", "VLDL", "LDLCALC", "LDLDIRECT"   Other studies Reviewed: Additional studies/ records that were reviewed today with results demonstrating: Echo reviewed showing EF 40 to 45% with basal septal hypertrophy.   ASSESSMENT AND PLAN:  S/p AVR: Status post TAVR in 2022.  Continue to use SBE prophylaxis. SHOB: Check be met and BNP today.  Check TSH and SPEP as well.  He appears euvolemic on exam.  Lower extremity swelling is improved since last visit.  Did not feel most recently that twice a day Lasix was helping so he has gone back to once a day.  Continue once a day Lasix for now. Chronic systolic heart failure: Given h/o AS and global LV dysfunction,  will check for amyloid with PYP scan. AFib: Appears to be maintaining sinus rhythm. HTN: Fluctuations with his blood pressure.  Will move telmisartan to the evening as his lowest blood  pressure readings tend to be late morning after taking all of his medication.  He will then have high blood pressures first thing in the morning and later at night.  Now is using hydralazine as needed. Pacemaker: followed by EP.    Current medicines are reviewed at length with the patient today.  The patient concerns regarding his medicines were addressed.  The following changes have been made: As above  Labs/ tests ordered today include: As above No orders of the defined types were placed in this encounter.   Recommend 150 minutes/week of aerobic exercise Low fat, low carb, high fiber diet recommended  Disposition:   FU in based on PYP scan result   Signed, Lance Muss, MD  06/29/2022 10:18 AM    [facility] [address] [place], [place], [place]  [address] Phone: [phone]; Fax: [phone]

## 2022-06-27 NOTE — Telephone Encounter (Signed)
Spoke with patient to review Dr Hassell Done recommendations. Patient would like to come in to discuss with physician. He is scheduled for Friday 06/29/22.

## 2022-06-29 ENCOUNTER — Ambulatory Visit: Payer: Medicare Other | Attending: Interventional Cardiology | Admitting: Interventional Cardiology

## 2022-06-29 ENCOUNTER — Encounter: Payer: Self-pay | Admitting: Interventional Cardiology

## 2022-06-29 VITALS — BP 142/78 | HR 84 | Ht 72.0 in | Wt 215.4 lb

## 2022-06-29 DIAGNOSIS — I5022 Chronic systolic (congestive) heart failure: Secondary | ICD-10-CM | POA: Diagnosis not present

## 2022-06-29 DIAGNOSIS — Z950 Presence of cardiac pacemaker: Secondary | ICD-10-CM

## 2022-06-29 DIAGNOSIS — I10 Essential (primary) hypertension: Secondary | ICD-10-CM

## 2022-06-29 DIAGNOSIS — R0609 Other forms of dyspnea: Secondary | ICD-10-CM | POA: Diagnosis not present

## 2022-06-29 DIAGNOSIS — Z952 Presence of prosthetic heart valve: Secondary | ICD-10-CM | POA: Diagnosis not present

## 2022-06-29 NOTE — Patient Instructions (Addendum)
Medication Instructions:  Your physician has recommended you make the following change in your medication: Change time you take telmisartan to the evening  *If you need a refill on your cardiac medications before your next appointment, please call your pharmacy*   Lab Work: Lab work to be done today--BMP, BNP, SPEP, TSH If you have labs (blood work) drawn today and your tests are completely normal, you will receive your results only by: [place] (if you have MyChart) OR A paper copy in the mail If you have any lab test that is abnormal or we need to change your treatment, we will call you to review the results.   Testing/Procedures: Dr Irish Lack recommends you have a PYP scan.  This is done in the office.  There are no special instructions prior to the test.  It will take 1.5 -2 hours.    Follow-Up: At [facility], you and your health needs are our priority.  As part of our continuing mission to provide you with exceptional heart care, we have created designated Provider Care Teams.  These Care Teams include your primary Cardiologist (physician) and Advanced Practice Providers (APPs -  Physician Assistants and Nurse Practitioners) who all work together to provide you with the care you need, when you need it.  We recommend signing up for the patient portal called "MyChart".  Sign up information is provided on this After Visit Summary.  MyChart is used to connect with patients for Virtual Visits (Telemedicine).  Patients are able to view lab/test results, encounter notes, upcoming appointments, etc.  Non-urgent messages can be sent to your provider as well.   To learn more about what you can do with MyChart, go to [email or web address].    Your next appointment:   To be arranged after testing  Provider:   Larae Grooms, MD     Other Instructions

## 2022-06-29 NOTE — Progress Notes (Signed)
Remote pacemaker transmission.   

## 2022-07-03 LAB — BASIC METABOLIC PANEL
BUN/Creatinine Ratio: 20 (ref 10–24)
BUN: 24 mg/dL (ref 8–27)
CO2: 26 mmol/L (ref 20–29)
Calcium: 10.3 mg/dL — ABNORMAL HIGH (ref 8.6–10.2)
Chloride: 103 mmol/L (ref 96–106)
Creatinine, Ser: 1.19 mg/dL (ref 0.76–1.27)
Glucose: 98 mg/dL (ref 70–99)
Potassium: 4.5 mmol/L (ref 3.5–5.2)
Sodium: 144 mmol/L (ref 134–144)
eGFR: 62 mL/min/{1.73_m2} (ref >59)

## 2022-07-03 LAB — TSH: TSH: 2.39 u[IU]/mL (ref 0.450–4.500)

## 2022-07-03 LAB — PROTEIN ELECTROPHORESIS, SERUM
A/G Ratio: 1.5 (ref 0.7–1.7)
Albumin ELP: 4 g/dL (ref 2.9–4.4)
Alpha 1: 0.3 g/dL (ref 0.0–0.4)
Alpha 2: 0.8 g/dL (ref 0.4–1.0)
Beta: 1 g/dL (ref 0.7–1.3)
Gamma Globulin: 0.7 g/dL (ref 0.4–1.8)
Globulin, Total: 2.7 g/dL (ref 2.2–3.9)
Total Protein: 6.7 g/dL (ref 6.0–8.5)

## 2022-07-03 LAB — PRO B NATRIURETIC PEPTIDE: NT-Pro BNP: 1544 pg/mL — ABNORMAL HIGH (ref 0–486)

## 2022-07-04 ENCOUNTER — Other Ambulatory Visit: Payer: Medicare Other

## 2022-07-09 ENCOUNTER — Ambulatory Visit: Payer: Medicare Other | Admitting: Interventional Cardiology

## 2022-07-23 ENCOUNTER — Telehealth (HOSPITAL_COMMUNITY): Payer: Self-pay | Admitting: *Deleted

## 2022-07-23 NOTE — Telephone Encounter (Signed)
Patient reminded of upcoming Amyloid appointment Kirstie Peri, RN

## 2022-07-25 ENCOUNTER — Ambulatory Visit (HOSPITAL_COMMUNITY): Payer: Medicare Other | Attending: Interventional Cardiology

## 2022-07-25 DIAGNOSIS — Z952 Presence of prosthetic heart valve: Secondary | ICD-10-CM | POA: Diagnosis not present

## 2022-07-25 DIAGNOSIS — I5022 Chronic systolic (congestive) heart failure: Secondary | ICD-10-CM | POA: Insufficient documentation

## 2022-07-25 DIAGNOSIS — I10 Essential (primary) hypertension: Secondary | ICD-10-CM | POA: Insufficient documentation

## 2022-07-25 DIAGNOSIS — Z950 Presence of cardiac pacemaker: Secondary | ICD-10-CM | POA: Diagnosis present

## 2022-07-25 DIAGNOSIS — R0609 Other forms of dyspnea: Secondary | ICD-10-CM | POA: Insufficient documentation

## 2022-07-25 MED ORDER — TECHNETIUM TC 99M PYROPHOSPHATE
21.8000 | Freq: Once | INTRAVENOUS | Status: AC
  Administered 2022-07-25: 21.8 via INTRAVENOUS

## 2022-07-27 ENCOUNTER — Telehealth: Payer: Self-pay | Admitting: *Deleted

## 2022-07-27 DIAGNOSIS — R52 Pain, unspecified: Secondary | ICD-10-CM

## 2022-07-27 DIAGNOSIS — I429 Cardiomyopathy, unspecified: Secondary | ICD-10-CM

## 2022-07-27 DIAGNOSIS — R079 Chest pain, unspecified: Secondary | ICD-10-CM

## 2022-07-27 NOTE — Telephone Encounter (Signed)
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  Also, there are some spots on the ribs that showed up as well indicating some change in the bones, not related to his heart.  Does he have a h/o rib fractures or rib injuries?  If not, it may be worthwhile to get more xrays to look and see if there are other areas of bone changes that may explain some of the symptoms he was having in his chest and legs.   In terms of how to treat the cardiac amyloid, I am going to have to get some help from my noninvasive partners.

## 2022-07-27 NOTE — Telephone Encounter (Signed)
Patient notified that Dr Irish Lack recommends he have Bone survey X ray.   He will contact [place] to schedule appointment. Phone number provided.  Phone number also given to patient for [facility] radiology scheduling if no appointments available at [place] that work with his schedule.  Patient aware he will be contacted for CHF clinic appointment with Dr Aundra Dubin

## 2022-07-27 NOTE — Telephone Encounter (Signed)
Patient reports he fell in November and was sore on his left side for awhile after fall.  He was seen in the ED at that time and X rays were taken.Patient reports everything was OK on the X ray.  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  Spoke with [facility] Imaging and study to be ordered is DG Bone Survey

## 2022-07-30 ENCOUNTER — Encounter: Payer: Self-pay | Admitting: Interventional Cardiology

## 2022-07-30 DIAGNOSIS — R52 Pain, unspecified: Secondary | ICD-10-CM

## 2022-08-01 ENCOUNTER — Telehealth (HOSPITAL_COMMUNITY): Payer: Self-pay | Admitting: *Deleted

## 2022-08-01 NOTE — Telephone Encounter (Signed)
Opened in error

## 2022-08-01 NOTE — Telephone Encounter (Signed)
Called patient's wife per Dr. Aundra Dubin post PYP scan per Dr. Delbert Harness. Confirmed first appointment with Dr. Aundra Dubin on 08/06/22 at 10:40. Wife verbalized understanding of same and is familiar with where to come.

## 2022-08-06 ENCOUNTER — Ambulatory Visit (HOSPITAL_COMMUNITY)
Admission: RE | Admit: 2022-08-06 | Discharge: 2022-08-06 | Disposition: A | Discharge status: Home / Self Care | Payer: Medicare Other | Source: Ambulatory Visit | Attending: Cardiology | Admitting: Cardiology

## 2022-08-06 ENCOUNTER — Encounter (HOSPITAL_COMMUNITY): Payer: Medicare Other | Admitting: Cardiology

## 2022-08-06 ENCOUNTER — Encounter (HOSPITAL_COMMUNITY): Payer: Self-pay | Admitting: Cardiology

## 2022-08-06 VITALS — BP 156/84 | HR 60 | Ht 72.0 in | Wt 220.0 lb

## 2022-08-06 DIAGNOSIS — Z7901 Long term (current) use of anticoagulants: Secondary | ICD-10-CM | POA: Insufficient documentation

## 2022-08-06 DIAGNOSIS — I7121 Aneurysm of the ascending aorta, without rupture: Secondary | ICD-10-CM | POA: Insufficient documentation

## 2022-08-06 DIAGNOSIS — Z953 Presence of xenogenic heart valve: Secondary | ICD-10-CM | POA: Insufficient documentation

## 2022-08-06 DIAGNOSIS — I43 Cardiomyopathy in diseases classified elsewhere: Secondary | ICD-10-CM | POA: Diagnosis not present

## 2022-08-06 DIAGNOSIS — I110 Hypertensive heart disease with heart failure: Secondary | ICD-10-CM | POA: Diagnosis not present

## 2022-08-06 DIAGNOSIS — I2510 Atherosclerotic heart disease of native coronary artery without angina pectoris: Secondary | ICD-10-CM | POA: Insufficient documentation

## 2022-08-06 DIAGNOSIS — I429 Cardiomyopathy, unspecified: Secondary | ICD-10-CM | POA: Diagnosis not present

## 2022-08-06 DIAGNOSIS — E854 Organ-limited amyloidosis: Secondary | ICD-10-CM | POA: Diagnosis not present

## 2022-08-06 DIAGNOSIS — Z79899 Other long term (current) drug therapy: Secondary | ICD-10-CM | POA: Insufficient documentation

## 2022-08-06 DIAGNOSIS — I452 Bifascicular block: Secondary | ICD-10-CM | POA: Diagnosis not present

## 2022-08-06 DIAGNOSIS — Z952 Presence of prosthetic heart valve: Secondary | ICD-10-CM

## 2022-08-06 DIAGNOSIS — I5022 Chronic systolic (congestive) heart failure: Secondary | ICD-10-CM | POA: Diagnosis present

## 2022-08-06 DIAGNOSIS — I480 Paroxysmal atrial fibrillation: Secondary | ICD-10-CM | POA: Diagnosis not present

## 2022-08-06 DIAGNOSIS — Z950 Presence of cardiac pacemaker: Secondary | ICD-10-CM

## 2022-08-06 LAB — CBC
HCT: 41.8 % (ref 39.0–52.0)
Hemoglobin: 13.6 g/dL (ref 13.0–17.0)
MCH: 31.1 pg (ref 26.0–34.0)
MCHC: 32.5 g/dL (ref 30.0–36.0)
MCV: 95.7 fL (ref 80.0–100.0)
Platelets: 197 10*3/uL (ref 150–400)
RBC: 4.37 MIL/uL (ref 4.22–5.81)
RDW: 14.3 % (ref 11.5–15.5)
WBC: 7.5 10*3/uL (ref 4.0–10.5)
nRBC: 0 % (ref 0.0–0.2)

## 2022-08-06 LAB — BASIC METABOLIC PANEL
Anion gap: 9 (ref 5–15)
BUN: 18 mg/dL (ref 8–23)
CO2: 29 mmol/L (ref 22–32)
Calcium: 9.8 mg/dL (ref 8.9–10.3)
Chloride: 103 mmol/L (ref 98–111)
Creatinine, Ser: 1.22 mg/dL (ref 0.61–1.24)
GFR, Estimated: 60 mL/min — ABNORMAL LOW (ref >60)
Glucose, Bld: 100 mg/dL — ABNORMAL HIGH (ref 70–99)
Potassium: 3.6 mmol/L (ref 3.5–5.1)
Sodium: 141 mmol/L (ref 135–145)

## 2022-08-06 LAB — BRAIN NATRIURETIC PEPTIDE: B Natriuretic Peptide: 362.3 pg/mL — ABNORMAL HIGH (ref 0.0–100.0)

## 2022-08-06 MED ORDER — ENTRESTO 24-26 MG PO TABS: 1.0000 | ORAL_TABLET | Freq: Two times a day (BID) | ORAL | 11 refills | Status: DC

## 2022-08-06 NOTE — Patient Instructions (Addendum)
STOP Toprol STOP Micardis START Entresto 24/26 mg one tab twice a day  Labs today We will only contact you if something comes back abnormal or we need to make some changes. Otherwise no news is good news!  Labs needed in 10-14 days  Your physician has requested that you have a cardiac MRI. Cardiac MRI uses a computer to create images of your heart as its beating, producing both still and moving pictures of your heart and major blood vessels. For further information please visit [email or web address] Please follow the instruction sheet given to you today for more information. -once approved with your insurance, scheduling will be in contact with an appointment  Your physician recommends that you schedule a follow-up appointment in: 4-6 weeks with Dr Aundra Dubin  Do the following things EVERYDAY: Weigh yourself in the morning before breakfast. Write it down and keep it in a log. Take your medicines as prescribed Eat low salt foods--Limit salt (sodium) to 2000 mg per day.  Stay as active as you can everyday Limit all fluids for the day to less than 2 liters\  At the [facility], you and your health needs are our priority. As part of our continuing mission to provide you with exceptional heart care, we have created designated Provider Care Teams. These Care Teams include your primary Cardiologist (physician) and Advanced Practice Providers (APPs- Physician Assistants and Nurse Practitioners) who all work together to provide you with the care you need, when you need it.   You may see any of the following providers on your designated Care Team at your next follow up: Dr Glori Bickers Dr Loralie Champagne Dr. Roxana Hires, NP Lyda Jester, [place] [facility] [place], [place] Forestine Na, NP Audry Riles, PharmD   Please be sure to bring in all your medications bottles to every appointment.    Thank you for choosing [facility]   If you have any questions or concerns before your next appointment please send [place] a message through [place] or call our office at [phone].    TO LEAVE A MESSAGE FOR THE NURSE SELECT OPTION 2, PLEASE LEAVE A MESSAGE INCLUDING: YOUR NAME DATE OF BIRTH CALL BACK NUMBER REASON FOR CALL**this is important as we prioritize the call backs  YOU WILL RECEIVE A CALL BACK THE SAME DAY AS LONG AS YOU CALL BEFORE 4:00 PM

## 2022-08-06 NOTE — Progress Notes (Signed)
Blood collected for TTR genetic testing per Dr Aundra Dubin.  Order form completed and both shipped by FedEx to [facility] (see copy for tracking #)  -hard copy order given to patient for urine immunofixation with address to [facility] at [address]

## 2022-08-06 NOTE — Progress Notes (Signed)
PCP: Kathalene Frames, MD Cardiology: Dr. Irish Lack HF Cardiology: Dr. Aundra Dubin  82 y.o. with history of bicuspid aortic valve s/p bioprosthetic AVR, bradycardia with Medtronic PPM, paroxysmal atrial fibrillation/flutter, and chronic HF with mid range EF was referred by Dr. Irish Lack for evaluation for possible cardiac amyloidosis. Patient had bioprosthetic aortic valve replacement in 6/22.  Pre-op, cath showed minimal CAD in 5/22.  Post-op, he developed symptomatic bradycardia and had Medtronic PPM placed.  He is primarily v-paced now.  He has paroxysmal atrial fibrillation but is generally in NSR.  Initial echo in 8/22 post-AVR showed EF 55-60%.  However, echoes in 9/23 and 12/23 showed EF 40-45%, mild LVH, mild RV enlargement with low normal systolic function, bioprosthetic aortic valve with mean gradient 16 mmHg. He had a PYP scan in 2/24 that was grade 1 with H/CL ratio > 1.5 but patient has increased uptake in the sternum and the left lateral ribs which may have falsely elevated the H/CL ration based on sample volume location.  This study seems at most equivocal.  Patient does report a bad fall onto his left side with rib pain towards the end of 2023.    Patient developed significant exertional dyspnea a few months ago.  This improved after starting Lasix.  He was initially on 40 mg bid but dose was recently decreased to 40 mg daily.  Now he notes dyspnea walking up a hill but can walk for 2 miles without much trouble.  No orthopnea/PND.  Prior to taking Lasix, he was getting short of breath taking a shower, but this is much better now. BP is mildly elevated today.  He is on multiple anti-hypertensives.  He does not feel palpitations, no lightheadedness.   ECG (personally reviewed): A-V dual pacing  Labs (1/24): SPEP normal, TSH normal, pro-BNP 1544, K 4.5, creatinine 1.19  Medtronic device interrogation: Rare AF, 99% V-pacing  PMH: 1. Bicuspid aortic valve disorder: With severe AS, s/p  bioprosthetic aortic valve replacement 6/22.  - 4.2 cm ascending aorta by CTA 8/23.  2. AKI in setting of IV contrast with CTA in 8/23, required transient HD.  3. Nephrolithiasis 4. HTN 5. Gout 6. BPPV 7. Paroxysmal atrial fibrillation/flutter.  8. Bradycardia: Post-op AVR, has Medtronic PPM.  9. CAD: LHC in 5/22 with 50% stenosis in PLV branch, otherwise no significant disease.  10. Chronic HF with mid-range EF: Echo in 8/22 post-AVR with EF 55-60%.  - Echo (9/23): EF 40-45% - Echo (12/23): EF 40-45%, mild LVH, mild RV enlargement with low normal function, bioprosthetic aortic valve with mean gradient 16 mmHg.  - PYP scan (2/24): Grade 1, H/CL ratio > 1.5 but patient has increased uptake in the sternum and the left lateral ribs which may have falsely elevated the H/CL ration based on sample volume location.  This study seems at most equivocal.   SH: Retired Administrator, married, no smoking, rare ETOH.   Family History  Problem Relation Age of Onset   Other Mother 38       HEAVY 37   COPD Father 30       HEAVY SMOKER   Other Sister 14       HEAVY SMOKER   ROS: All systems reviewed and negative except as per HPI.   Current Outpatient Medications  Medication Sig Dispense Refill   acetaminophen (TYLENOL) 325 MG tablet Take 2 tablets (650 mg total) by mouth every 6 (six) hours as needed for mild pain (or Fever >/= 101).  amLODipine (NORVASC) 5 MG tablet Take 1 tablet (5 mg total) by mouth every evening. 30 tablet 3   apixaban (ELIQUIS) 5 MG TABS tablet Take 1 tablet (5 mg total) by mouth 2 (two) times daily. 180 tablet 3   atorvastatin (LIPITOR) 20 MG tablet Take 20 mg by mouth every evening.     carvedilol (COREG) 6.25 MG tablet Take 1 tablet (6.25 mg total) by mouth 2 (two) times daily. 180 tablet 3   cholecalciferol (VITAMIN D) 25 MCG (1000 UNIT) tablet Take 1,000 Units by mouth in the morning.     finasteride (PROSCAR) 5 MG tablet Take 5 mg by mouth in the morning.      furosemide (LASIX) 40 MG tablet Take 40 mg by mouth daily.     hydrALAZINE (APRESOLINE) 50 MG tablet Take 1.5 tablets (75 mg total) by mouth 2 (two) times daily. 270 tablet 3   Multiple Vitamin (MULTIVITAMIN WITH MINERALS) TABS tablet Take 1 tablet by mouth in the morning.     Omega-3 Fatty Acids (FISH OIL) 1000 MG CAPS Take 1,000 mg by mouth 2 (two) times daily.     potassium chloride SA (KLOR-CON M) 20 MEQ tablet Take 1 tablet (20 mEq total) by mouth daily. 90 tablet 3   sacubitril-valsartan (ENTRESTO) 24-26 MG Take 1 tablet by mouth 2 (two) times daily. 60 tablet 11   No current facility-administered medications for this encounter.   BP (!) 156/84   Pulse 60   Ht 6' (1.829 m)   Wt 99.8 kg (220 lb)   SpO2 99%   BMI 29.84 kg/m  General: NAD Neck: No JVD, no thyromegaly or thyroid nodule.  Lungs: Clear to auscultation bilaterally with normal respiratory effort. CV: Nondisplaced PMI.  Heart regular S1/S2, no S3/S4, no murmur.  No peripheral edema.  No carotid bruit.  Normal pedal pulses.  Abdomen: Soft, nontender, no hepatosplenomegaly, no distention.  Skin: Intact without lesions or rashes.  Neurologic: Alert and oriented x 3.  Psych: Normal affect. Extremities: No clubbing or cyanosis.  HEENT: Normal.   Assessment/Plan: 1. Chronic HF with mid range EF:  Echo in 9/23 and again in 12/23 showed EF 40-45%, mild LVH, mild RV enlargement with low normal function, bioprosthetic aortic valve with mean gradient 16 mmHg.  This is down from EF 55-60% on 8/22 echo.  Pre-AVR cath in 5/22 showed only mild CAD and he has had no chest pain, think ischemic CMP is unlikely.  Cardiac amyloidosis has been raised as a concern.  PYP scan in 2/24 was abnormal, but I think this study is at most equivocal. It was read as grade 1, H/CL ratio > 1.5 but patient has increased uptake in the sternum and the left lateral ribs which may have falsely elevated the H/CL ration based on sample volume location.  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   He does not look volume overloaded on exam.  NYHA class II.  - Continue Lasix 40 mg daily, check BMET/BNP today.  - He is taking both Coreg and Toprol XL.  Stop Toprol XL and continue Coreg 6.25 mg bid.  - I will stop telmisartan 80 daily and start Entresto 49/51 bid.  I suspect this will allow a bit of additional diuresis.  BMET 10 days.  - Would like him on SGLT2 inhibitor and spironolactone in the future.  - I think it would be reasonable to consider device upgrade to BiV  or left bundle pacing given drop in EF and symptomatic HF, especially if amyloidosis workup is negative.  - I will send full myeloma workup with myeloma panel, serum free light chains and urine immunofixation.  - As above, I think that the PYP scan was at most equivocal.  I will arrange for a cardiac MRI (device is MRI compatible).  If this is NOT suggestive of cardiac amyloidosis, I think we can forgo further workup.  - He will need workup of abnormal bone uptake on PYP scan, this is ongoing.  2. Bioprosthetic aortic valve: This was stable on 12/23 echo.  3. HTN: BP is elevated today.  - Continue amlodipine 5 mg daily.  - As above, stop telmisartan and start Entresto 49/51 bid.  - Continue hydralazine for now, may be able to stop future and increase Entresto and amlodipine (and add spironolactone).  4. Atrial fibrillation: Paroxysmal. NSR today.   - Continue apixaban.  5. CAD: Mild on 5/22 cath.  - Continue atorvastatin.  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  Patient had AKI with CT contrast, would avoid in future (could do MRA chest).   Followup 1 month.   Loralie Champagne 08/06/2022

## 2022-08-08 ENCOUNTER — Ambulatory Visit (HOSPITAL_COMMUNITY)
Admission: RE | Admit: 2022-08-08 | Discharge: 2022-08-08 | Disposition: A | Discharge status: Home / Self Care | Payer: Medicare Other | Source: Ambulatory Visit | Attending: Interventional Cardiology | Admitting: Interventional Cardiology

## 2022-08-08 DIAGNOSIS — R52 Pain, unspecified: Secondary | ICD-10-CM | POA: Diagnosis present

## 2022-08-10 ENCOUNTER — Other Ambulatory Visit (HOSPITAL_COMMUNITY): Payer: Self-pay | Admitting: Cardiology

## 2022-08-10 LAB — MULTIPLE MYELOMA PANEL, SERUM
Albumin SerPl Elph-Mcnc: 4 g/dL (ref 2.9–4.4)
Albumin/Glob SerPl: 1.4 (ref 0.7–1.7)
Alpha 1: 0.3 g/dL (ref 0.0–0.4)
Alpha2 Glob SerPl Elph-Mcnc: 0.8 g/dL (ref 0.4–1.0)
B-Globulin SerPl Elph-Mcnc: 1 g/dL (ref 0.7–1.3)
Gamma Glob SerPl Elph-Mcnc: 0.9 g/dL (ref 0.4–1.8)
Globulin, Total: 3 g/dL (ref 2.2–3.9)
IgA: 157 mg/dL (ref 61–437)
IgG (Immunoglobin G), Serum: 844 mg/dL (ref 603–1613)
IgM (Immunoglobulin M), Srm: 13 mg/dL — ABNORMAL LOW (ref 15–143)
Total Protein ELP: 7 g/dL (ref 6.0–8.5)

## 2022-08-12 LAB — IFE+PROTEIN ELECTRO, 24-HR UR

## 2022-08-13 ENCOUNTER — Ambulatory Visit (HOSPITAL_COMMUNITY): Payer: Medicare Other

## 2022-08-14 LAB — IFE+PROTEIN ELECTRO, 24-HR UR
% BETA, Urine: 11.1 %
ALBUMIN, U: 75.6 %
ALPHA 1 URINE: 3.2 %
ALPHA-2-GLOBULIN, U: 3.6 %
GAMMA GLOBULIN URINE: 6.5 %
Protein, 24H Urine: 404 mg/24 hr — ABNORMAL HIGH (ref 30–150)
Protein, Ur: 26.9 mg/dL

## 2022-08-16 ENCOUNTER — Ambulatory Visit (HOSPITAL_COMMUNITY)
Admission: RE | Admit: 2022-08-16 | Discharge: 2022-08-16 | Disposition: A | Discharge status: Home / Self Care | Payer: Medicare Other | Source: Ambulatory Visit | Attending: Cardiology | Admitting: Cardiology

## 2022-08-16 DIAGNOSIS — I43 Cardiomyopathy in diseases classified elsewhere: Secondary | ICD-10-CM | POA: Diagnosis not present

## 2022-08-16 DIAGNOSIS — E854 Organ-limited amyloidosis: Secondary | ICD-10-CM | POA: Diagnosis present

## 2022-08-16 LAB — BASIC METABOLIC PANEL
Anion gap: 8 (ref 5–15)
BUN: 28 mg/dL — ABNORMAL HIGH (ref 8–23)
CO2: 30 mmol/L (ref 22–32)
Calcium: 9.8 mg/dL (ref 8.9–10.3)
Chloride: 100 mmol/L (ref 98–111)
Creatinine, Ser: 1.19 mg/dL (ref 0.61–1.24)
GFR, Estimated: >60 mL/min (ref >60)
Glucose, Bld: 114 mg/dL — ABNORMAL HIGH (ref 70–99)
Potassium: 4.3 mmol/L (ref 3.5–5.1)
Sodium: 138 mmol/L (ref 135–145)

## 2022-08-21 ENCOUNTER — Other Ambulatory Visit (HOSPITAL_COMMUNITY): Payer: Self-pay | Admitting: Cardiology

## 2022-09-05 ENCOUNTER — Ambulatory Visit (INDEPENDENT_AMBULATORY_CARE_PROVIDER_SITE_OTHER): Payer: Medicare Other

## 2022-09-05 DIAGNOSIS — I429 Cardiomyopathy, unspecified: Secondary | ICD-10-CM | POA: Diagnosis not present

## 2022-09-06 LAB — CUP PACEART REMOTE DEVICE CHECK
Battery Remaining Longevity: 123 mo
Battery Voltage: 3.01 V
Brady Statistic AP VP Percent: 67.44 %
Brady Statistic AP VS Percent: 0 %
Brady Statistic AS VP Percent: 32.37 %
Brady Statistic AS VS Percent: 0.19 %
Brady Statistic RA Percent Paced: 67.54 %
Brady Statistic RV Percent Paced: 99.81 %
Date Time Interrogation Session: 20240320070304
Implantable Lead Connection Status: 753985
Implantable Lead Connection Status: 753985
Implantable Lead Implant Date: 20220622
Implantable Lead Implant Date: 20220622
Implantable Lead Location: 753859
Implantable Lead Location: 753860
Implantable Lead Model: 3830
Implantable Lead Model: 5076
Implantable Pulse Generator Implant Date: 20220622
Lead Channel Impedance Value: 228 Ohm
Lead Channel Impedance Value: 361 Ohm
Lead Channel Impedance Value: 437 Ohm
Lead Channel Impedance Value: 570 Ohm
Lead Channel Pacing Threshold Amplitude: 0.625 V
Lead Channel Pacing Threshold Amplitude: 1.25 V
Lead Channel Pacing Threshold Pulse Width: 0.4 ms
Lead Channel Pacing Threshold Pulse Width: 0.4 ms
Lead Channel Sensing Intrinsic Amplitude: 1.875 mV
Lead Channel Sensing Intrinsic Amplitude: 1.875 mV
Lead Channel Sensing Intrinsic Amplitude: 12 mV
Lead Channel Sensing Intrinsic Amplitude: 31.625 mV
Lead Channel Setting Pacing Amplitude: 2 V
Lead Channel Setting Pacing Amplitude: 2.5 V
Lead Channel Setting Pacing Pulse Width: 0.4 ms
Lead Channel Setting Sensing Sensitivity: 0.9 mV
Zone Setting Status: 755011

## 2022-09-10 ENCOUNTER — Encounter: Payer: Self-pay | Admitting: Cardiology

## 2022-09-24 ENCOUNTER — Ambulatory Visit (HOSPITAL_COMMUNITY)
Admission: RE | Admit: 2022-09-24 | Discharge: 2022-09-24 | Disposition: A | Discharge status: Home / Self Care | Payer: Medicare Other | Source: Ambulatory Visit | Attending: Cardiology | Admitting: Cardiology

## 2022-09-24 ENCOUNTER — Telehealth (HOSPITAL_COMMUNITY): Payer: Self-pay

## 2022-09-24 ENCOUNTER — Other Ambulatory Visit (HOSPITAL_COMMUNITY): Payer: Self-pay

## 2022-09-24 ENCOUNTER — Encounter (HOSPITAL_COMMUNITY): Payer: Self-pay | Admitting: Cardiology

## 2022-09-24 VITALS — BP 120/70 | HR 60 | Wt 224.6 lb

## 2022-09-24 DIAGNOSIS — Z79899 Other long term (current) drug therapy: Secondary | ICD-10-CM | POA: Insufficient documentation

## 2022-09-24 DIAGNOSIS — I2510 Atherosclerotic heart disease of native coronary artery without angina pectoris: Secondary | ICD-10-CM | POA: Insufficient documentation

## 2022-09-24 DIAGNOSIS — I110 Hypertensive heart disease with heart failure: Secondary | ICD-10-CM | POA: Insufficient documentation

## 2022-09-24 DIAGNOSIS — Z953 Presence of xenogenic heart valve: Secondary | ICD-10-CM | POA: Diagnosis not present

## 2022-09-24 DIAGNOSIS — I5022 Chronic systolic (congestive) heart failure: Secondary | ICD-10-CM | POA: Insufficient documentation

## 2022-09-24 DIAGNOSIS — I10 Essential (primary) hypertension: Secondary | ICD-10-CM | POA: Insufficient documentation

## 2022-09-24 DIAGNOSIS — Z7901 Long term (current) use of anticoagulants: Secondary | ICD-10-CM | POA: Insufficient documentation

## 2022-09-24 DIAGNOSIS — N179 Acute kidney failure, unspecified: Secondary | ICD-10-CM | POA: Insufficient documentation

## 2022-09-24 DIAGNOSIS — I7121 Aneurysm of the ascending aorta, without rupture: Secondary | ICD-10-CM | POA: Diagnosis not present

## 2022-09-24 DIAGNOSIS — I480 Paroxysmal atrial fibrillation: Secondary | ICD-10-CM | POA: Insufficient documentation

## 2022-09-24 LAB — BASIC METABOLIC PANEL
Anion gap: 8 (ref 5–15)
BUN: 23 mg/dL (ref 8–23)
CO2: 26 mmol/L (ref 22–32)
Calcium: 9.4 mg/dL (ref 8.9–10.3)
Chloride: 106 mmol/L (ref 98–111)
Creatinine, Ser: 1.1 mg/dL (ref 0.61–1.24)
GFR, Estimated: >60 mL/min (ref >60)
Glucose, Bld: 102 mg/dL — ABNORMAL HIGH (ref 70–99)
Potassium: 4 mmol/L (ref 3.5–5.1)
Sodium: 140 mmol/L (ref 135–145)

## 2022-09-24 LAB — BRAIN NATRIURETIC PEPTIDE: B Natriuretic Peptide: 541.5 pg/mL — ABNORMAL HIGH (ref 0.0–100.0)

## 2022-09-24 MED ORDER — DAPAGLIFLOZIN PROPANEDIOL 10 MG PO TABS: 10.0000 mg | ORAL_TABLET | Freq: Every day | ORAL | 11 refills | Status: DC

## 2022-09-24 MED ORDER — SPIRONOLACTONE 25 MG PO TABS: 12.5000 mg | ORAL_TABLET | Freq: Every day | ORAL | 3 refills | Status: DC

## 2022-09-24 NOTE — Progress Notes (Addendum)
PCP: Emilio AspenHenderson, Jonathan A, MD Cardiology: Dr. Eldridge DaceVaranasi HF Cardiology: Dr. Shirlee LatchMcLean  82 y.o. with history of bicuspid aortic valve s/p bioprosthetic AVR, bradycardia with Medtronic PPM, paroxysmal atrial fibrillation/flutter, and chronic HF with mid range EF was referred by Dr. Eldridge DaceVaranasi for evaluation for possible cardiac amyloidosis. Patient had bioprosthetic aortic valve replacement in 6/22.  Pre-op, cath showed minimal CAD in 5/22.  Post-op, he developed symptomatic bradycardia and had Medtronic PPM placed.  He is primarily v-paced now.  He has paroxysmal atrial fibrillation but is generally in NSR.  Initial echo in 8/22 post-AVR showed EF 55-60%.  However, echoes in 9/23 and 12/23 showed EF 40-45%, mild LVH, mild RV enlargement with low normal systolic function, bioprosthetic aortic valve with mean gradient 16 mmHg. He had a PYP scan in 2/24 that was grade 1 with H/CL ratio > 1.5 but patient has increased uptake in the sternum and the left lateral ribs which may have falsely elevated the H/CL ration based on sample volume location.  This study seems at most equivocal.  Patient does report a bad fall onto his left side with rib pain towards the end of 2023.  Bone survey was done that was normal in 2/24.     Patient returns for followup of CHF.  Weight down 4 lbs. Dyspnea is improved, he walks 1.5-2.5 miles with his wife, gets winded with hills.  No orthopnea/PND.  No lightheadedness.  No chest pain.    ECG (personally reviewed): NSR with RV pacing  Labs (1/24): SPEP normal, TSH normal, pro-BNP 1544, K 4.5, creatinine 1.19 Labs (2/24): Myeloma panel negative, urine immunofixation negative, K 4.3, creatinine 1.19, BNP 362  Medtronic device interrogation: No AF, 99.9% V-pacing  PMH: 1. Bicuspid aortic valve disorder: With severe AS, s/p bioprosthetic aortic valve replacement 6/22.  - 4.2 cm ascending aorta by CTA 8/23.  2. AKI in setting of IV contrast with CTA in 8/23, required transient HD.  3.  Nephrolithiasis 4. HTN 5. Gout 6. BPPV 7. Paroxysmal atrial fibrillation/flutter.  8. Bradycardia: Post-op AVR, has Medtronic PPM.  9. CAD: LHC in 5/22 with 50% stenosis in PLV branch, otherwise no significant disease.  10. Chronic HF with mid-range EF: Echo in 8/22 post-AVR with EF 55-60%.  - Echo (9/23): EF 40-45% - Echo (12/23): EF 40-45%, mild LVH, mild RV enlargement with low normal function, bioprosthetic aortic valve with mean gradient 16 mmHg.  - PYP scan (2/24): Grade 1, H/CL ratio > 1.5 but patient has increased uptake in the sternum and the left lateral ribs which may have falsely elevated the H/CL ration based on sample volume location.  This study seems at most equivocal. Bone survey done in 2/24 was normal.   SH: Retired Naval architecttruck driver, married, no smoking, rare ETOH.   Family History  Problem Relation Age of Onset   Other Mother 1571       HEAVY SMOKER   COPD Father [id number]       HEAVY SMOKER   Other Sister [phone]       HEAVY SMOKER   ROS: All systems reviewed and negative except as per HPI.   Current Outpatient Medications  Medication Sig Dispense Refill   acetaminophen (TYLENOL) 325 MG tablet Take 2 tablets (650 mg total) by mouth every 6 (six) hours as needed for mild pain (or Fever >/= 101).     amLODipine (NORVASC) 5 MG tablet Take 1 tablet (5 mg total) by mouth every evening. 30 tablet 3   apixaban (ELIQUIS) 5  MG TABS tablet Take 1 tablet (5 mg total) by mouth 2 (two) times daily. 180 tablet 3   atorvastatin (LIPITOR) 20 MG tablet Take 20 mg by mouth every evening.     carvedilol (COREG) 6.25 MG tablet Take 1 tablet (6.25 mg total) by mouth 2 (two) times daily. 180 tablet 3   cholecalciferol (VITAMIN D) 25 MCG (1000 UNIT) tablet Take 1,000 Units by mouth in the morning.     dapagliflozin propanediol (FARXIGA) 10 MG TABS tablet Take 1 tablet (10 mg total) by mouth daily before breakfast. 30 tablet 11   finasteride (PROSCAR) 5 MG tablet Take 5 mg by mouth in the morning.      furosemide (LASIX) 40 MG tablet Take 40 mg by mouth daily.     Multiple Vitamin (MULTIVITAMIN WITH MINERALS) TABS tablet Take 1 tablet by mouth in the morning.     Omega-3 Fatty Acids (FISH OIL) 1000 MG CAPS Take 1,000 mg by mouth 2 (two) times daily.     potassium chloride SA (KLOR-CON M) 20 MEQ tablet Take 1 tablet (20 mEq total) by mouth daily. 90 tablet 3   sacubitril-valsartan (ENTRESTO) 24-26 MG Take 1 tablet by mouth 2 (two) times daily. 60 tablet 11   spironolactone (ALDACTONE) 25 MG tablet Take 0.5 tablets (12.5 mg total) by mouth daily. 45 tablet 3   No current facility-administered medications for this encounter.   BP 120/70   Pulse 60   Wt 101.9 kg (224 lb 9.6 oz)   SpO2 96%   BMI 30.46 kg/m  General: NAD Neck: No JVD, no thyromegaly or thyroid nodule.  Lungs: Clear to auscultation bilaterally with normal respiratory effort. CV: Nondisplaced PMI.  Heart regular S1/S2, no S3/S4, no murmur.  No peripheral edema.  No carotid bruit.  Normal pedal pulses.  Abdomen: Soft, nontender, no hepatosplenomegaly, no distention.  Skin: Intact without lesions or rashes.  Neurologic: Alert and oriented x 3.  Psych: Normal affect. Extremities: No clubbing or cyanosis.  HEENT: Normal.   Assessment/Plan: 1. Chronic HF with mid range EF:  Echo in 9/23 and again in 12/23 showed EF 40-45%, mild LVH, mild RV enlargement with low normal function, bioprosthetic aortic valve with mean gradient 16 mmHg.  This is down from EF 55-60% on 8/22 echo.  Pre-AVR cath in 5/22 showed only mild CAD and he has had no chest pain, think ischemic CMP is unlikely.  Cardiac amyloidosis has been raised as a concern.  PYP scan in 2/24 was abnormal, but I think this study is at most equivocal. It was read as grade 1, H/CL ratio > 1.5 but patient has increased uptake in the sternum and the left lateral ribs which may have falsely elevated the H/CL ration based on sample volume location (bone survey done later in 2/24 was  normal).  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   He does not look volume overloaded on exam today and weight is down.  NYHA class II.  - Continue Lasix 40 mg daily, check BMET/BNP today.  - Continue Coreg 6.25 mg bid.  - Continue Entresto 24/26 bid.  - Stop hydralazine, start spironolactone 12.5 daily and Farxiga 10 daily.  BMET 10 days.   - I think it would be reasonable to consider device upgrade to BiV or left bundle pacing given drop in EF and symptomatic HF => will send back to Dr. Graciela Husbands to consider.   - As above, I  think that the PYP scan was at most equivocal. Myeloma studies were negative.  Cardiac MRI is scheduled for May (device is MRI compatible).  If this is NOT suggestive of cardiac amyloidosis, I think we can forgo further workup.  2. Bioprosthetic aortic valve: This was stable on 12/23 echo.  3. HTN:  BP controlled.  4. Atrial fibrillation: Paroxysmal. NSR today.   - Continue apixaban.  5. CAD: Mild on 5/22 cath.  - Continue atorvastatin.  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  Patient had AKI with CT contrast, would avoid in future (could do MRA chest).   Followup 3 months.   Marca Ancona 09/24/2022

## 2022-09-24 NOTE — Telephone Encounter (Signed)
Patient aware of lab results.

## 2022-09-24 NOTE — Patient Instructions (Addendum)
STOP Hydralazine   START Farxiga 10 mg daily.  START spironolactone 12.5 mg ( 1/2 tab) daily.  Labs done today, your results will be available in MyChart, we will contact you for abnormal readings.  Repeat blood work in 10 days.  Dr. Odessa Fleming office will call you to arrange an appointment.  Your physician recommends that you schedule a follow-up appointment in: 3 months (July) ** please call the office in Mid May to arrange your follow up appointment. **  If you have any questions or concerns before your next appointment please send [place] a message through [place] or call our office at [phone].    TO LEAVE A MESSAGE FOR THE NURSE SELECT OPTION 2, PLEASE LEAVE A MESSAGE INCLUDING: YOUR NAME DATE OF BIRTH CALL BACK NUMBER REASON FOR CALL**this is important as we prioritize the call backs  YOU WILL RECEIVE A CALL BACK THE SAME DAY AS LONG AS YOU CALL BEFORE 4:00 PM  At the [facility], you and your health needs are our priority. As part of our continuing mission to provide you with exceptional heart care, we have created designated Provider Care Teams. These Care Teams include your primary Cardiologist (physician) and Advanced Practice Providers (APPs- Physician Assistants and Nurse Practitioners) who all work together to provide you with the care you need, when you need it.   You may see any of the following providers on your designated Care Team at your next follow up: Dr Arvilla Meres Dr Marca Ancona Dr. Marcos Eke, NP Robbie Lis, [place] [facility] [place], [place] Brynda Peon, NP Karle Plumber, PharmD   Please be sure to bring in all your medications bottles to every appointment.    Thank you for choosing [facility]

## 2022-10-02 DIAGNOSIS — H34831 Tributary (branch) retinal vein occlusion, right eye, with macular edema: Secondary | ICD-10-CM | POA: Diagnosis not present

## 2022-10-02 DIAGNOSIS — H43822 Vitreomacular adhesion, left eye: Secondary | ICD-10-CM | POA: Diagnosis not present

## 2022-10-02 DIAGNOSIS — H35372 Puckering of macula, left eye: Secondary | ICD-10-CM | POA: Diagnosis not present

## 2022-10-02 DIAGNOSIS — H3562 Retinal hemorrhage, left eye: Secondary | ICD-10-CM | POA: Diagnosis not present

## 2022-10-04 ENCOUNTER — Ambulatory Visit (HOSPITAL_COMMUNITY)
Admission: RE | Admit: 2022-10-04 | Discharge: 2022-10-04 | Disposition: A | Discharge status: Home / Self Care | Payer: Medicare Other | Source: Ambulatory Visit | Attending: Internal Medicine | Admitting: Internal Medicine

## 2022-10-04 DIAGNOSIS — I5022 Chronic systolic (congestive) heart failure: Secondary | ICD-10-CM

## 2022-10-04 LAB — BASIC METABOLIC PANEL
Anion gap: 7 (ref 5–15)
BUN: 24 mg/dL — ABNORMAL HIGH (ref 8–23)
CO2: 27 mmol/L (ref 22–32)
Calcium: 9.7 mg/dL (ref 8.9–10.3)
Chloride: 105 mmol/L (ref 98–111)
Creatinine, Ser: 1.22 mg/dL (ref 0.61–1.24)
GFR, Estimated: 60 mL/min — ABNORMAL LOW (ref >60)
Glucose, Bld: 109 mg/dL — ABNORMAL HIGH (ref 70–99)
Potassium: 4.2 mmol/L (ref 3.5–5.1)
Sodium: 139 mmol/L (ref 135–145)

## 2022-10-05 ENCOUNTER — Encounter: Payer: Self-pay | Admitting: Cardiology

## 2022-10-10 ENCOUNTER — Encounter: Payer: Self-pay | Admitting: Internal Medicine

## 2022-10-10 ENCOUNTER — Ambulatory Visit: Payer: Medicare Other | Attending: Internal Medicine | Admitting: Internal Medicine

## 2022-10-10 VITALS — BP 118/70 | HR 60 | Ht 72.0 in | Wt 216.8 lb

## 2022-10-10 DIAGNOSIS — I452 Bifascicular block: Secondary | ICD-10-CM | POA: Diagnosis not present

## 2022-10-10 DIAGNOSIS — I442 Atrioventricular block, complete: Secondary | ICD-10-CM

## 2022-10-10 DIAGNOSIS — I480 Paroxysmal atrial fibrillation: Secondary | ICD-10-CM | POA: Diagnosis not present

## 2022-10-10 DIAGNOSIS — Z950 Presence of cardiac pacemaker: Secondary | ICD-10-CM | POA: Diagnosis not present

## 2022-10-10 LAB — CUP PACEART INCLINIC DEVICE CHECK
Battery Remaining Longevity: 116 mo
Battery Voltage: 3.01 V
Brady Statistic AP VP Percent: 72.73 %
Brady Statistic AP VS Percent: 0 %
Brady Statistic AS VP Percent: 26.8 %
Brady Statistic AS VS Percent: 0.47 %
Brady Statistic RA Percent Paced: 72.91 %
Brady Statistic RV Percent Paced: 99.52 %
Date Time Interrogation Session: 20240424165532
Implantable Lead Connection Status: 753985
Implantable Lead Connection Status: 753985
Implantable Lead Implant Date: 20220622
Implantable Lead Implant Date: 20220622
Implantable Lead Location: 753859
Implantable Lead Location: 753860
Implantable Lead Model: 3830
Implantable Lead Model: 5076
Implantable Pulse Generator Implant Date: 20220622
Lead Channel Impedance Value: 247 Ohm
Lead Channel Impedance Value: 361 Ohm
Lead Channel Impedance Value: 456 Ohm
Lead Channel Impedance Value: 570 Ohm
Lead Channel Pacing Threshold Amplitude: 0.625 V
Lead Channel Pacing Threshold Amplitude: 1.25 V
Lead Channel Pacing Threshold Pulse Width: 0.4 ms
Lead Channel Pacing Threshold Pulse Width: 0.4 ms
Lead Channel Sensing Intrinsic Amplitude: 12 mV
Lead Channel Sensing Intrinsic Amplitude: 2.75 mV
Lead Channel Sensing Intrinsic Amplitude: 2.875 mV
Lead Channel Sensing Intrinsic Amplitude: 31.625 mV
Lead Channel Setting Pacing Amplitude: 2 V
Lead Channel Setting Pacing Amplitude: 2.75 V
Lead Channel Setting Pacing Pulse Width: 0.4 ms
Lead Channel Setting Sensing Sensitivity: 0.9 mV
Zone Setting Status: 755011

## 2022-10-10 NOTE — Patient Instructions (Signed)
Medication Instructions:  Your physician recommends that you continue on your current medications as directed. Please refer to the Current Medication list given to you today.  *If you need a refill on your cardiac medications before your next appointment, please call your pharmacy*   Lab Work: None ordered.  If you have labs (blood work) drawn today and your tests are completely normal, you will receive your results only by: MyChart Message (if you have MyChart) OR A paper copy in the mail If you have any lab test that is abnormal or we need to change your treatment, we will call you to review the results.   Testing/Procedures: None ordered.    Follow-Up: At [facility], you and your health needs are our priority.  As part of our continuing mission to provide you with exceptional heart care, we have created designated Provider Care Teams.  These Care Teams include your primary Cardiologist (physician) and Advanced Practice Providers (APPs -  Physician Assistants and Nurse Practitioners) who all work together to provide you with the care you need, when you need it.  We recommend signing up for the patient portal called "MyChart".  Sign up information is provided on this After Visit Summary.  MyChart is used to connect with patients for Virtual Visits (Telemedicine).  Patients are able to view lab/test results, encounter notes, upcoming appointments, etc.  Non-urgent messages can be sent to your provider as well.   To learn more about what you can do with MyChart, go to [email or web address].    Your next appointment:   8 weeks with Dr Graciela Husbands

## 2022-10-10 NOTE — Progress Notes (Signed)
Patient Care Team: Emilio Aspen, MD as PCP - General (Internal Medicine) [name], MD as PCP - Cardiology (Cardiology)   HPI  Derek Washington is a 82 y.o. 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 fib and flutter anticoagulated with apixaban.  Device dependent   C/o shortness of breath which has been progressive over the last year.  No edema no nocturnal dyspnea also associated with a sense of chest fullness  Has also noted left leg weakness over the last 3 or 4 months.  Not falling down, able to do leg squat as well as left-sided support.      DATE TEST EF   5/22 LHC    % Non obstruc CAD X 50% RPAV  5/22 Echo  55-60%   8/22 Echo   55-65 %   9/23 Echo  40-45%   12/23 Echo   40-45%    Date Cr K Hgb  6/22 1.13 4.1 9.6   7/23 1.08 4.2 13.1  4/24 1.22 4.2 13.6     Records and Results Reviewed   Past Medical History:  Diagnosis Date   Bicuspid aortic valve    LAST ECHOCARDIOGRAM APPROXIMATELY 2016? MILD AORTIC REGURG   BPH (benign prostatic hyperplasia)    Hard of hearing    Heart murmur    High cholesterol    Hydrocele    Hypertension    Insomnia    Low back pain    RIGHT   Mild obesity    Nocturia    Presence of permanent cardiac pacemaker    Right elbow tendonitis    Tinnitus     Past Surgical History:  Procedure Laterality Date   AORTIC VALVE REPLACEMENT N/A 12/01/2020   Procedure: AORTIC VALVE REPLACEMENT (AVR) USING INSPIRIS AORTIC VALVE;  Surgeon: [name], MD;  Location: [facility] OR;  Service: Open Heart Surgery;  Laterality: N/A;   CYSTOSCOPY WITH URETEROSCOPY, STONE BASKETRY AND STENT PLACEMENT Left 02/05/2022   Procedure: CYSTOSCOPY WITH LEFT URERETAL STENT PLACEMENT, RETROGRADE PYELOGRAM, URETEROSCOPY WITH  STONE EXTRACTION, LASER LITHOTRIPSY;  Surgeon: [name], MD;  Location: [facility] ORS;  Service: Urology;  Laterality: Left;   HYDROCELE EXCISION  2017   DR. [name] 2017   IR FLUORO GUIDE CV LINE RIGHT  02/01/2022   IR US GUIDE VASC ACCESS RIGHT  02/01/2022   JOINT REPLACEMENT     left total knee replacement      2007    PACEMAKER IMPLANT N/A 12/07/2020   Procedure: PACEMAKER IMPLANT;  Surgeon: [name], MD;  Location: [facility] INVASIVE CV LAB;  Service: Cardiovascular;  Laterality: N/A;   right knee total replacement      2011   RIGHT/LEFT HEART CATH AND CORONARY ANGIOGRAPHY N/A 11/03/2020   Procedure: RIGHT/LEFT HEART CATH AND CORONARY ANGIOGRAPHY;  Surgeon: [name], MD;  Location: [facility] INVASIVE CV LAB;  Service: Cardiovascular;  Laterality: N/A;   SCROTAL EXPLORATION N/A 12/26/2015   Procedure: SCROTUM EXPLORATION;  Surgeon: [name], MD;  Location: [facility];  Service: Urology;  Laterality: N/A;  EXCISION OF LEFT EPIDIDYMAL CYST/SPERMATOCELE   TEE WITHOUT CARDIOVERSION N/A 12/01/2020   Procedure: TRANSESOPHAGEAL ECHOCARDIOGRAM (TEE);  Surgeon: [name], MD;  Location: [facility] OR;  Service: Open Heart Surgery;  Laterality: N/A;   TONSILLECTOMY     VASECTOMY  1968   Current Meds  Medication Sig   acetaminophen (TYLENOL) 325 MG tablet Take 2 tablets (650 mg total) by mouth every 6 (six) hours as needed for mild pain (or Fever >/= 101).   amLODipine (NORVASC) 5 MG tablet Take 1 tablet (5 mg total) by mouth every evening.   apixaban (ELIQUIS) 5 MG TABS tablet Take 1 tablet (5 mg total) by mouth 2 (two) times daily.   atorvastatin (LIPITOR) 20 MG tablet Take 20 mg by mouth every evening.   carvedilol (COREG) 6.25 MG tablet Take 1 tablet (6.25 mg total) by mouth 2 (two) times daily.   cholecalciferol (VITAMIN D) 25 MCG (1000 UNIT) tablet Take 1,000 Units by mouth in the morning.   dapagliflozin propanediol (FARXIGA) 10 MG TABS tablet Take 1 tablet (10 mg total) by mouth  daily before breakfast.   finasteride (PROSCAR) 5 MG tablet Take 5 mg by mouth in the morning.   furosemide (LASIX) 40 MG tablet Take 40 mg by mouth daily.   Multiple Vitamin (MULTIVITAMIN WITH MINERALS) TABS tablet Take 1 tablet by mouth in the morning.   Omega-3 Fatty Acids (FISH OIL) 1000 MG CAPS Take 1,000 mg by mouth 2 (two) times daily.   potassium chloride SA (KLOR-CON M) 20 MEQ tablet Take 1 tablet (20 mEq total) by mouth daily.   sacubitril-valsartan (ENTRESTO) 24-26 MG Take 1 tablet by mouth 2 (two) times daily.   spironolactone (ALDACTONE) 25 MG tablet Take 0.5 tablets (12.5 mg total) by mouth daily.      Allergies  Allergen Reactions   Iodinated Contrast Media Other (See Comments)    Caused kidney failure requiring dialysis   Hydrochlorothiazide Other (See Comments)    fatigue      Review of Systems negative except from HPI and PMH  Physical Exam BP 118/70   Pulse 60   Ht 6' (1.829 m)   Wt 216 lb 12.8 oz (98.3 kg)   SpO2 98%   BMI 29.40 kg/m  Well developed and well nourished in no acute distress HENT normal Neck supple with JVP-flat Clear Device pocket well healed; without hematoma or erythema.  There is no tethering  Regular rate and rhythm, no   gallop No  murmur Abd-soft with active BS No Clubbing cyanosis  edema Skin-warm and dry A & Oriented  Grossly normal sensory left leg is weak with hip raises both supine and seated, great toe weakness and knee extension weakness  ECG P synchronous pacing at 63 Interval 24/17/45  #2 sinus with P synchronous pacing and infusion, with a PR interval of 216 and a QRS of 134  Estimated Creatinine Clearance: 58.7 mL/min (by C-G formula based on SCr of 1.22 mg/dL).   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  We have discussed lead revision with either another effort at left bundle branch area or a left ventricular lead.    Left leg weakness is concerning and will require further evaluation normal certainly with MR.  Hence, what  we will do is we will postpone device upgrade so as not to have fresh leads in place and I have reached out to neurosurgery to see how we can facilitate evaluation of the weakness.  For now we will continue him on Farxiga and Entresto Aldactone and carvedilol.  Continue the Eliquis, without bleeding and edema is better on the lower dose of amlodipine.

## 2022-10-11 ENCOUNTER — Telehealth: Payer: Self-pay | Admitting: Internal Medicine

## 2022-10-11 NOTE — Telephone Encounter (Signed)
STAT if HR is under 50 or over 120 (normal HR is 60-100 beats per minute)  What is your heart rate?  Patient states his HR is at 89, but normally in the 60's. He would like to discuss having his PPM removed.  Do you have a log of your heart rate readings (document readings)?    Do you have any other symptoms?  SOB/dizziness

## 2022-10-11 NOTE — Progress Notes (Signed)
Remote pacemaker transmission.   

## 2022-10-11 NOTE — Telephone Encounter (Signed)
Dizziness, having trouble breathing started today.  Pressure in upper chest, feels tight and heavy, difficulty to describe.     HR 89, 110/66.  Worse with exertion.    Sending remote transmission at this time.  He is aware I will notify device clinic and we will contact him once received.

## 2022-10-11 NOTE — Telephone Encounter (Signed)
Returned call to Pt.  Pt had rate response turned on yesterday in office with Dr. Graciela Husbands.  Discussed with Pt that there is sometimes an adjustment period getting used to having that feature turned on.    Encouraged Pt to give it a couple of days to see if he doesn't feel better.  If he continues to not like the intensity of the rate response we can make adjustments or turn it back off.  Pt in agreement.  He will continue to monitor his symptoms and call back if he cannot tolerate.

## 2022-10-15 NOTE — Telephone Encounter (Signed)
Called to followup re tachypalpitations Feeling better and ambulating better

## 2022-10-16 DIAGNOSIS — R29898 Other symptoms and signs involving the musculoskeletal system: Secondary | ICD-10-CM | POA: Diagnosis not present

## 2022-10-17 ENCOUNTER — Other Ambulatory Visit (HOSPITAL_COMMUNITY): Payer: Self-pay | Admitting: Neurosurgery

## 2022-10-17 DIAGNOSIS — I5022 Chronic systolic (congestive) heart failure: Secondary | ICD-10-CM | POA: Diagnosis not present

## 2022-10-17 DIAGNOSIS — N182 Chronic kidney disease, stage 2 (mild): Secondary | ICD-10-CM | POA: Diagnosis not present

## 2022-10-17 DIAGNOSIS — I129 Hypertensive chronic kidney disease with stage 1 through stage 4 chronic kidney disease, or unspecified chronic kidney disease: Secondary | ICD-10-CM | POA: Diagnosis not present

## 2022-10-17 DIAGNOSIS — Z950 Presence of cardiac pacemaker: Secondary | ICD-10-CM | POA: Diagnosis not present

## 2022-10-17 DIAGNOSIS — I480 Paroxysmal atrial fibrillation: Secondary | ICD-10-CM | POA: Diagnosis not present

## 2022-10-17 DIAGNOSIS — N210 Calculus in bladder: Secondary | ICD-10-CM | POA: Diagnosis not present

## 2022-10-17 DIAGNOSIS — R3912 Poor urinary stream: Secondary | ICD-10-CM | POA: Diagnosis not present

## 2022-10-17 DIAGNOSIS — E785 Hyperlipidemia, unspecified: Secondary | ICD-10-CM | POA: Diagnosis not present

## 2022-10-17 DIAGNOSIS — R29898 Other symptoms and signs involving the musculoskeletal system: Secondary | ICD-10-CM

## 2022-10-17 DIAGNOSIS — N200 Calculus of kidney: Secondary | ICD-10-CM | POA: Diagnosis not present

## 2022-10-18 ENCOUNTER — Telehealth (HOSPITAL_COMMUNITY): Payer: Self-pay | Admitting: *Deleted

## 2022-10-18 NOTE — Telephone Encounter (Signed)
Reaching out to patient to offer assistance regarding upcoming cardiac imaging study; pt verbalizes understanding of appt date/time, parking situation and where to check in, and verified current allergies; name and call back number provided for further questions should they arise  Larey Brick RN Navigator Cardiac Imaging [facility] [phone] office [phone] cell  Patient denies claustrophobia but does have a knee replacement and a pace maker.  He is aware to arrive at 12pm.

## 2022-10-19 ENCOUNTER — Other Ambulatory Visit (HOSPITAL_COMMUNITY): Payer: Self-pay | Admitting: Cardiology

## 2022-10-19 ENCOUNTER — Ambulatory Visit (HOSPITAL_COMMUNITY)
Admission: RE | Admit: 2022-10-19 | Discharge: 2022-10-19 | Disposition: A | Discharge status: Home / Self Care | Payer: Medicare Other | Source: Ambulatory Visit | Attending: Cardiology | Admitting: Cardiology

## 2022-10-19 DIAGNOSIS — I428 Other cardiomyopathies: Secondary | ICD-10-CM

## 2022-10-19 DIAGNOSIS — I43 Cardiomyopathy in diseases classified elsewhere: Secondary | ICD-10-CM | POA: Diagnosis not present

## 2022-10-19 DIAGNOSIS — E854 Organ-limited amyloidosis: Secondary | ICD-10-CM

## 2022-10-19 MED ORDER — GADOBUTROL 1 MMOL/ML IV SOLN
9.0000 mL | Freq: Once | INTRAVENOUS | Status: AC | PRN
  Administered 2022-10-19: 9 mL via INTRAVENOUS

## 2022-10-19 NOTE — Progress Notes (Signed)
Per order, Changed device settings for MRI to  DOO at 80 bpm  MRI mode  Will program device back to pre-MRI settings after completion of exam, and send transmission 

## 2022-10-22 ENCOUNTER — Telehealth (HOSPITAL_COMMUNITY): Payer: Self-pay

## 2022-10-22 NOTE — Telephone Encounter (Addendum)
Pt aware, agreeable, and verbalized understanding   ----- Message from Laurey Morale, MD sent at 10/21/2022  9:07 PM EDT ----- EF is low with dyssynchrony from pacing.  I do not see evidence for cardiac amyloidosis or other infiltrative disease.

## 2022-10-30 ENCOUNTER — Ambulatory Visit (HOSPITAL_COMMUNITY): Payer: Medicare Other

## 2022-12-05 ENCOUNTER — Ambulatory Visit (INDEPENDENT_AMBULATORY_CARE_PROVIDER_SITE_OTHER): Payer: Medicare Other

## 2022-12-05 DIAGNOSIS — I442 Atrioventricular block, complete: Secondary | ICD-10-CM | POA: Diagnosis not present

## 2022-12-05 LAB — CUP PACEART REMOTE DEVICE CHECK
Battery Remaining Longevity: 112 mo
Battery Voltage: 3 V
Brady Statistic AP VP Percent: 89.01 %
Brady Statistic AP VS Percent: 0.01 %
Brady Statistic AS VP Percent: 10.79 %
Brady Statistic AS VS Percent: 0.18 %
Brady Statistic RA Percent Paced: 89.11 %
Brady Statistic RV Percent Paced: 99.8 %
Date Time Interrogation Session: 20240618192542
Implantable Lead Connection Status: 753985
Implantable Lead Connection Status: 753985
Implantable Lead Implant Date: 20220622
Implantable Lead Implant Date: 20220622
Implantable Lead Location: 753859
Implantable Lead Location: 753860
Implantable Lead Model: 3830
Implantable Lead Model: 5076
Implantable Pulse Generator Implant Date: 20220622
Lead Channel Impedance Value: 247 Ohm
Lead Channel Impedance Value: 361 Ohm
Lead Channel Impedance Value: 437 Ohm
Lead Channel Impedance Value: 551 Ohm
Lead Channel Pacing Threshold Amplitude: 0.625 V
Lead Channel Pacing Threshold Amplitude: 1.25 V
Lead Channel Pacing Threshold Pulse Width: 0.4 ms
Lead Channel Pacing Threshold Pulse Width: 0.4 ms
Lead Channel Sensing Intrinsic Amplitude: 12 mV
Lead Channel Sensing Intrinsic Amplitude: 2.625 mV
Lead Channel Sensing Intrinsic Amplitude: 2.625 mV
Lead Channel Sensing Intrinsic Amplitude: 31.625 mV
Lead Channel Setting Pacing Amplitude: 2 V
Lead Channel Setting Pacing Amplitude: 2.75 V
Lead Channel Setting Pacing Pulse Width: 0.4 ms
Lead Channel Setting Sensing Sensitivity: 0.9 mV
Zone Setting Status: 755011

## 2022-12-07 ENCOUNTER — Encounter: Payer: Self-pay | Admitting: Internal Medicine

## 2022-12-11 ENCOUNTER — Other Ambulatory Visit: Payer: Self-pay

## 2022-12-11 ENCOUNTER — Encounter: Payer: Self-pay | Admitting: Internal Medicine

## 2022-12-11 ENCOUNTER — Ambulatory Visit: Payer: Medicare Other | Attending: Internal Medicine | Admitting: Internal Medicine

## 2022-12-11 VITALS — BP 110/70 | HR 71 | Ht 72.0 in | Wt 216.2 lb

## 2022-12-11 DIAGNOSIS — Z950 Presence of cardiac pacemaker: Secondary | ICD-10-CM | POA: Diagnosis not present

## 2022-12-11 DIAGNOSIS — Z01812 Encounter for preprocedural laboratory examination: Secondary | ICD-10-CM

## 2022-12-11 DIAGNOSIS — I429 Cardiomyopathy, unspecified: Secondary | ICD-10-CM | POA: Diagnosis not present

## 2022-12-11 DIAGNOSIS — I452 Bifascicular block: Secondary | ICD-10-CM | POA: Diagnosis not present

## 2022-12-11 DIAGNOSIS — I442 Atrioventricular block, complete: Secondary | ICD-10-CM | POA: Diagnosis not present

## 2022-12-11 DIAGNOSIS — I480 Paroxysmal atrial fibrillation: Secondary | ICD-10-CM

## 2022-12-11 DIAGNOSIS — I498 Other specified cardiac arrhythmias: Secondary | ICD-10-CM

## 2022-12-11 NOTE — Telephone Encounter (Signed)
Spoke with pt and reviewed device implant instructions.  Pt verbalizes understanding and agrees with current plan.

## 2022-12-11 NOTE — Progress Notes (Unsigned)
Patient Care Team: Emilio Aspen, MD as PCP - General (Internal Medicine) [name], MD as PCP - Cardiology (Cardiology)   HPI  Derek Washington is a 82 y.o. 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 fib and flutter anticoagulated with apixaban.  Now Device dependent  Seeing 4/24 with complaints of shortness of breath.  With new cardiomyopathy--temporally assoc with RV pacing , albeit with attempt at LBBa pacing the QRSd ended up 170 ms Has also noted left leg weakness over the last 3 or 4 months.   We had referred him to [facility] and he is scheduled for MRIs later this week  With medication adjustments he is feeling some better; MRI however (see below) showed interval further deterioration in systolic function   DATE TEST EF   5/22 LHC    % Non obstruc CAD X 50% RPAV  5/22 Echo  55-60%   8/22 Echo   55-65 %   9/23 Echo  40-45%   12/23 Echo   40-45%   5/24 cMRI 33% Concentric LVH    Date Cr K Hgb  6/22 1.13 4.1 9.6   7/23 1.08 4.2 13.1  4/24 1.22 4.2 13.6     Records and Results Reviewed   Past Medical History:  Diagnosis Date   Bicuspid aortic valve    LAST ECHOCARDIOGRAM APPROXIMATELY 2016? MILD AORTIC REGURG   BPH (benign prostatic hyperplasia)    Hard of hearing    Heart murmur    High cholesterol    Hydrocele    Hypertension    Insomnia    Low back pain    RIGHT   Mild obesity    Nocturia    Presence of permanent cardiac pacemaker    Right elbow tendonitis    Tinnitus     Past Surgical History:  Procedure Laterality Date   AORTIC VALVE REPLACEMENT N/A 12/01/2020   Procedure: AORTIC VALVE REPLACEMENT (AVR) USING INSPIRIS AORTIC VALVE;  Surgeon: [name], MD;  Location: [facility] OR;  Service: Open Heart Surgery;   Laterality: N/A;   CYSTOSCOPY WITH URETEROSCOPY, STONE BASKETRY AND STENT PLACEMENT Left 02/05/2022   Procedure: CYSTOSCOPY WITH LEFT URERETAL STENT PLACEMENT, RETROGRADE PYELOGRAM, URETEROSCOPY WITH STONE EXTRACTION, LASER LITHOTRIPSY;  Surgeon: [name], MD;  Location: [facility];  Service: Urology;  Laterality: Left;   HYDROCELE EXCISION  2017   DR. [name] 2017   IR FLUORO GUIDE CV LINE RIGHT  02/01/2022   IR US GUIDE VASC ACCESS RIGHT  02/01/2022   JOINT REPLACEMENT     left total knee replacement      2007    PACEMAKER IMPLANT N/A 12/07/2020   Procedure: PACEMAKER IMPLANT;  Surgeon: [name], MD;  Location: [facility] INVASIVE CV LAB;  Service: Cardiovascular;  Laterality: N/A;   right knee total replacement      2011   RIGHT/LEFT HEART CATH AND CORONARY ANGIOGRAPHY N/A 11/03/2020   Procedure: RIGHT/LEFT HEART CATH AND CORONARY ANGIOGRAPHY;  Surgeon: [name], MD;  Location: [facility] INVASIVE CV LAB;  Service: Cardiovascular;  Laterality: N/A;   SCROTAL EXPLORATION N/A 12/26/2015   Procedure: SCROTUM EXPLORATION;  Surgeon: [name], MD;  Location: [facility] OR[facility];  Service: Urology;  Laterality: N/A;  EXCISION OF LEFT EPIDIDYMAL CYST/SPERMATOCELE   TEE WITHOUT CARDIOVERSION N/A 12/01/2020   Procedure:  TRANSESOPHAGEAL ECHOCARDIOGRAM (TEE);  Surgeon: [name], MD;  Location: [facility] OR;  Service: Open Heart Surgery;  Laterality: N/A;   TONSILLECTOMY     VASECTOMY     1968   Current Meds  Medication Sig   acetaminophen (TYLENOL) 325 MG tablet Take 2 tablets (650 mg total) by mouth every 6 (six) hours as needed for mild pain (or Fever >/= 101).   apixaban (ELIQUIS) 5 MG TABS tablet Take 1 tablet (5 mg total) by mouth 2 (two) times daily.   atorvastatin (LIPITOR) 20 MG tablet Take 20 mg by mouth every evening.   carvedilol (COREG) 6.25 MG tablet Take 1 tablet (6.25 mg total) by mouth 2 (two) times daily.   cholecalciferol (VITAMIN D) 25 MCG (1000 UNIT) tablet Take 1,000 Units by  mouth in the morning.   dapagliflozin propanediol (FARXIGA) 10 MG TABS tablet Take 1 tablet (10 mg total) by mouth daily before breakfast.   finasteride (PROSCAR) 5 MG tablet Take 5 mg by mouth in the morning.   furosemide (LASIX) 40 MG tablet Take 40 mg by mouth daily.   Multiple Vitamin (MULTIVITAMIN WITH MINERALS) TABS tablet Take 1 tablet by mouth in the morning.   Omega-3 Fatty Acids (FISH OIL) 1000 MG CAPS Take 1,000 mg by mouth 2 (two) times daily.   potassium chloride SA (KLOR-CON M) 20 MEQ tablet Take 1 tablet (20 mEq total) by mouth daily.   sacubitril-valsartan (ENTRESTO) 24-26 MG Take 1 tablet by mouth 2 (two) times daily.   spironolactone (ALDACTONE) 25 MG tablet Take 0.5 tablets (12.5 mg total) by mouth daily.      Allergies  Allergen Reactions   Iodinated Contrast Media Other (See Comments)    Caused kidney failure requiring dialysis   Hydrochlorothiazide Other (See Comments)    fatigue      Review of Systems negative except from HPI and PMH  Physical Exam BP 110/70   Pulse 71   Ht 6' (1.829 m)   Wt 216 lb 3.2 oz (98.1 kg)   SpO2 96%   BMI 29.32 kg/m  Well developed and well nourished in no acute distress HENT normal Neck supple with JVP-flat Clear Device pocket well healed; without hematoma or erythema.  There is no tethering  Regular rate and rhythm, no  gallop No  murmur Abd-soft with active BS No Clubbing cyanosis  edema Skin-warm and dry A & Oriented  Grossly normal sensory and motor function  ECG sinus rhythm.  QRSd 172 LBBB pattern  Device function is normal. Programming changes   See Paceart for details     CrCl cannot be calculated (Patient's most recent lab result is older than the maximum 21 days allowed.).   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  Perhaps related to her rate response activation.    cMRI EF shows further interval deterioration in systolic function.  We will undertake CRT-P upgrade and anticipate the placement of an LV lead in conjunction with the left bundle branch area lead  The benefits and risks were reviewed including but not limited to death,  perforation, infection, lead dislodgement and device malfunction.  The patient understands agrees and is willing to proceed.  He is scheduled for his MRIs for  his back this week.  If something needs to be done urgently, we will defer our procedure for neurosurgical priority  Continue GDMT.  Continue Eliquis.  Will hold prior to the procedure

## 2022-12-11 NOTE — Patient Instructions (Signed)
Medication Instructions:  Your physician has recommended you make the following change in your medication:   ** Please begin taking your Spironolactone at bedtime.  *If you need a refill on your cardiac medications before your next appointment, please call your pharmacy*   Lab Work: CBC and BMET If you have labs (blood work) drawn today and your tests are completely normal, you will receive your results only by: MyChart Message (if you have MyChart) OR A paper copy in the mail If you have any lab test that is abnormal or we need to change your treatment, we will call you to review the results.   Testing/Procedures:  CRT or cardiac resynchronization therapy is a treatment used to correct heart failure. When you have heart failure your heart is weakened and doesn't pump as well as it should. This therapy may help reduce symptoms and improve the quality of life.  Please see the handout/brochure given to you today to get more information of the different options of therapy.    Follow-Up: At [facility], you and your health needs are our priority.  As part of our continuing mission to provide you with exceptional heart care, we have created designated Provider Care Teams.  These Care Teams include your primary Cardiologist (physician) and Advanced Practice Providers (APPs -  Physician Assistants and Nurse Practitioners) who all work together to provide you with the care you need, when you need it.  We recommend signing up for the patient portal called "MyChart".  Sign up information is provided on this After Visit Summary.  MyChart is used to connect with patients for Virtual Visits (Telemedicine).  Patients are able to view lab/test results, encounter notes, upcoming appointments, etc.  Non-urgent messages can be sent to your provider as well.   To learn more about what you can do with MyChart, go to [email or web address].    Your next appointment:   To be scheduled

## 2022-12-12 LAB — BASIC METABOLIC PANEL
BUN/Creatinine Ratio: 20 (ref 10–24)
BUN: 30 mg/dL — ABNORMAL HIGH (ref 8–27)
CO2: 25 mmol/L (ref 20–29)
Calcium: 10.3 mg/dL — ABNORMAL HIGH (ref 8.6–10.2)
Chloride: 102 mmol/L (ref 96–106)
Creatinine, Ser: 1.5 mg/dL — ABNORMAL HIGH (ref 0.76–1.27)
Glucose: 83 mg/dL (ref 70–99)
Potassium: 5.3 mmol/L — ABNORMAL HIGH (ref 3.5–5.2)
Sodium: 142 mmol/L (ref 134–144)
eGFR: 46 mL/min/{1.73_m2} — ABNORMAL LOW (ref >59)

## 2022-12-12 LAB — CBC
Hematocrit: 45.5 % (ref 37.5–51.0)
Hemoglobin: 15.3 g/dL (ref 13.0–17.7)
MCH: 32.6 pg (ref 26.6–33.0)
MCHC: 33.6 g/dL (ref 31.5–35.7)
MCV: 97 fL (ref 79–97)
Platelets: 176 10*3/uL (ref 150–450)
RBC: 4.7 x10E6/uL (ref 4.14–5.80)
RDW: 13.1 % (ref 11.6–15.4)
WBC: 7.6 10*3/uL (ref 3.4–10.8)

## 2022-12-13 NOTE — Progress Notes (Signed)
Sign

## 2022-12-13 NOTE — H&P (View-Only) (Signed)
Sign

## 2022-12-14 ENCOUNTER — Ambulatory Visit (HOSPITAL_COMMUNITY)
Admission: RE | Admit: 2022-12-14 | Discharge: 2022-12-14 | Disposition: A | Discharge status: Home / Self Care | Payer: Medicare Other | Source: Ambulatory Visit | Attending: Neurosurgery | Admitting: Neurosurgery

## 2022-12-14 ENCOUNTER — Telehealth: Payer: Self-pay

## 2022-12-14 DIAGNOSIS — Z79899 Other long term (current) drug therapy: Secondary | ICD-10-CM

## 2022-12-14 DIAGNOSIS — M5124 Other intervertebral disc displacement, thoracic region: Secondary | ICD-10-CM | POA: Diagnosis not present

## 2022-12-14 DIAGNOSIS — R29898 Other symptoms and signs involving the musculoskeletal system: Secondary | ICD-10-CM | POA: Insufficient documentation

## 2022-12-14 DIAGNOSIS — M47816 Spondylosis without myelopathy or radiculopathy, lumbar region: Secondary | ICD-10-CM | POA: Diagnosis not present

## 2022-12-14 DIAGNOSIS — M47814 Spondylosis without myelopathy or radiculopathy, thoracic region: Secondary | ICD-10-CM | POA: Diagnosis not present

## 2022-12-14 DIAGNOSIS — M48061 Spinal stenosis, lumbar region without neurogenic claudication: Secondary | ICD-10-CM | POA: Diagnosis not present

## 2022-12-14 DIAGNOSIS — E875 Hyperkalemia: Secondary | ICD-10-CM

## 2022-12-14 NOTE — Progress Notes (Signed)
Per order,  Changed device settings for MRI to DOO at 80 bpm Will program device back to pre-MRI settings after completion of exam, and send transmission.         

## 2022-12-14 NOTE — Telephone Encounter (Signed)
Spoke with pt and advised of lab result and recommendations per Dr Graciela Husbands.  Pt verbalizes understanding and agrees with current plan.

## 2022-12-14 NOTE — Addendum Note (Signed)
Addended by: Alois Cliche on: 12/14/2022 02:44 PM   Modules accepted: Orders

## 2022-12-14 NOTE — Telephone Encounter (Signed)
-----   Message from [name], MD sent at 12/13/2022 11:38 AM EDT ----- Please Inform Patient   Labs are normal x elevated K PLEASSE  have him stop his supplemental K  He will need repeat blood work in aobut 2 weeks   Thanks

## 2022-12-18 NOTE — Pre-Procedure Instructions (Signed)
Instructed patient on the following items: Arrival time 0830 Nothing to eat or drink after midnight No meds AM of procedure Responsible person to drive you home and stay with you for 24 hrs Wash with special soap night before and morning of procedure If on anti-coagulant drug instructions Eliquis- last dose 7/1

## 2022-12-19 ENCOUNTER — Ambulatory Visit (HOSPITAL_COMMUNITY)
Admission: RE | Disposition: A | Discharge status: Home / Self Care | Payer: Self-pay | Source: Home / Self Care | Attending: Internal Medicine

## 2022-12-19 ENCOUNTER — Other Ambulatory Visit: Payer: Self-pay

## 2022-12-19 ENCOUNTER — Encounter: Payer: Self-pay | Admitting: Internal Medicine

## 2022-12-19 ENCOUNTER — Ambulatory Visit (HOSPITAL_COMMUNITY)
Admission: RE | Admit: 2022-12-19 | Discharge: 2022-12-19 | Disposition: A | Discharge status: Home / Self Care | Payer: Medicare Other | Attending: Internal Medicine | Admitting: Internal Medicine

## 2022-12-19 ENCOUNTER — Ambulatory Visit (HOSPITAL_COMMUNITY): Payer: Medicare Other

## 2022-12-19 DIAGNOSIS — Z45010 Encounter for checking and testing of cardiac pacemaker pulse generator [battery]: Secondary | ICD-10-CM | POA: Diagnosis not present

## 2022-12-19 DIAGNOSIS — I4891 Unspecified atrial fibrillation: Secondary | ICD-10-CM | POA: Diagnosis not present

## 2022-12-19 DIAGNOSIS — I495 Sick sinus syndrome: Secondary | ICD-10-CM | POA: Diagnosis not present

## 2022-12-19 DIAGNOSIS — I771 Stricture of artery: Secondary | ICD-10-CM | POA: Diagnosis not present

## 2022-12-19 DIAGNOSIS — Z952 Presence of prosthetic heart valve: Secondary | ICD-10-CM | POA: Diagnosis not present

## 2022-12-19 DIAGNOSIS — I428 Other cardiomyopathies: Secondary | ICD-10-CM | POA: Insufficient documentation

## 2022-12-19 DIAGNOSIS — Z7901 Long term (current) use of anticoagulants: Secondary | ICD-10-CM | POA: Insufficient documentation

## 2022-12-19 DIAGNOSIS — Z950 Presence of cardiac pacemaker: Secondary | ICD-10-CM

## 2022-12-19 DIAGNOSIS — M6281 Muscle weakness (generalized): Secondary | ICD-10-CM | POA: Insufficient documentation

## 2022-12-19 DIAGNOSIS — I442 Atrioventricular block, complete: Secondary | ICD-10-CM | POA: Diagnosis not present

## 2022-12-19 DIAGNOSIS — I77810 Thoracic aortic ectasia: Secondary | ICD-10-CM | POA: Diagnosis not present

## 2022-12-19 DIAGNOSIS — I4892 Unspecified atrial flutter: Secondary | ICD-10-CM | POA: Insufficient documentation

## 2022-12-19 DIAGNOSIS — Z959 Presence of cardiac and vascular implant and graft, unspecified: Secondary | ICD-10-CM

## 2022-12-19 HISTORY — PX: BIV UPGRADE: EP1202

## 2022-12-19 SURGERY — BIV UPGRADE

## 2022-12-19 MED ORDER — FENTANYL CITRATE (PF) 100 MCG/2ML IJ SOLN
INTRAMUSCULAR | Status: DC | PRN
  Administered 2022-12-19 (×4): 25 ug via INTRAVENOUS

## 2022-12-19 MED ORDER — SODIUM CHLORIDE 0.9 % IV SOLN: INTRAVENOUS | Status: DC

## 2022-12-19 MED ORDER — MIDAZOLAM HCL 5 MG/5ML IJ SOLN
INTRAMUSCULAR | Status: AC
  Filled 2022-12-19: qty 5

## 2022-12-19 MED ORDER — CEFAZOLIN SODIUM-DEXTROSE 2-4 GM/100ML-% IV SOLN: 2.0000 g | INTRAVENOUS | Status: AC

## 2022-12-19 MED ORDER — CEFAZOLIN SODIUM-DEXTROSE 2-4 GM/100ML-% IV SOLN
INTRAVENOUS | Status: AC
  Administered 2022-12-19: 2 g via INTRAVENOUS
  Filled 2022-12-19: qty 100

## 2022-12-19 MED ORDER — LIDOCAINE HCL 1 % IJ SOLN
INTRAMUSCULAR | Status: AC
  Filled 2022-12-19: qty 20

## 2022-12-19 MED ORDER — LIDOCAINE HCL (PF) 1 % IJ SOLN
INTRAMUSCULAR | Status: DC | PRN
  Administered 2022-12-19: 30 mL

## 2022-12-19 MED ORDER — FENTANYL CITRATE (PF) 100 MCG/2ML IJ SOLN
INTRAMUSCULAR | Status: AC
  Filled 2022-12-19: qty 2

## 2022-12-19 MED ORDER — IOHEXOL 350 MG/ML SOLN
INTRAVENOUS | Status: DC | PRN
  Administered 2022-12-19: 5 mL

## 2022-12-19 MED ORDER — SODIUM CHLORIDE 0.9 % IV SOLN
INTRAVENOUS | Status: AC
  Administered 2022-12-19: 80 mg
  Filled 2022-12-19: qty 2

## 2022-12-19 MED ORDER — ONDANSETRON HCL 4 MG/2ML IJ SOLN: 4.0000 mg | Freq: Four times a day (QID) | INTRAMUSCULAR | Status: DC | PRN

## 2022-12-19 MED ORDER — MIDAZOLAM HCL 5 MG/5ML IJ SOLN
INTRAMUSCULAR | Status: DC | PRN
  Administered 2022-12-19 (×5): 1 mg via INTRAVENOUS

## 2022-12-19 MED ORDER — HEPARIN (PORCINE) IN NACL 1000-0.9 UT/500ML-% IV SOLN
INTRAVENOUS | Status: DC | PRN
  Administered 2022-12-19: 500 mL

## 2022-12-19 MED ORDER — CHLORHEXIDINE GLUCONATE 4 % EX SOLN
4.0000 | Freq: Once | CUTANEOUS | Status: DC
  Filled 2022-12-19: qty 60

## 2022-12-19 MED ORDER — ACETAMINOPHEN 325 MG PO TABS: 325.0000 mg | ORAL_TABLET | ORAL | Status: DC | PRN

## 2022-12-19 MED ORDER — SODIUM CHLORIDE 0.9 % IV SOLN: 80.0000 mg | INTRAVENOUS | Status: AC

## 2022-12-19 SURGICAL SUPPLY — 18 items
BALLN ATTAIN 80 (BALLOONS) ×1
BALLOON ATTAIN 80 (BALLOONS) IMPLANT
CABLE SURGICAL S-101-97-12 (CABLE) ×1 IMPLANT
CATH CPS DIRECT 135 DS2C020 (CATHETERS) IMPLANT
DEVICE CRTP PERCEPTA QUAD MRI (Pacemaker) IMPLANT
DEVICE DISSECT PLASMABLAD 3.0S (MISCELLANEOUS) IMPLANT
HEMOSTAT SURGICEL 2X4 FIBR (HEMOSTASIS) IMPLANT
KIT ESSENTIALS PG (KITS) IMPLANT
KIT MICROPUNCTURE NIT STIFF (SHEATH) IMPLANT
LEAD QUARTET 1456Q-86 (Lead) IMPLANT
PAD DEFIB RADIO PHYSIO CONN (PAD) ×1 IMPLANT
PLASMABLADE 3.0S (MISCELLANEOUS) ×1
QUARTET 1456Q-86 (Lead) ×1 IMPLANT
SHEATH 9.5FR PRELUDE SNAP 13 (SHEATH) IMPLANT
SLITTER AGILIS HISPRO (INSTRUMENTS) IMPLANT
TRAY PACEMAKER INSERTION (PACKS) ×1 IMPLANT
WIRE ACUITY WHISPER EDS 4648 (WIRE) IMPLANT
WIRE HI TORQ VERSACORE-J 145CM (WIRE) IMPLANT

## 2022-12-19 NOTE — Discharge Instructions (Addendum)
Dermabond will come off in next 10-14 days, if not will remove at wound check visit. Keep wound dry until tomorrow evening. No driving x 4 days. Wound check in office as schedule.  After Your Pacemaker   You have a Medtronic Pacemaker  ACTIVITY Do not lift your arm above shoulder height for 1 week after your procedure. After 7 days, you may progress as below.  You should remove your sling 24 hours after your procedure, unless otherwise instructed by your provider.     Wednesday December 26, 2022  Thursday December 27, 2022 Friday December 28, 2022 Saturday December 29, 2022   Do not lift, push, pull, or carry anything over 10 pounds with the affected arm until 6 weeks (Wednesday January 30, 2023 ) after your procedure.   You may drive AFTER your wound check, unless you have been told otherwise by your provider.   Ask your healthcare provider when you can go back to work   INCISION/Dressing Your blood thinner Eliquis, should be resumed on 7/8  Monitor your Pacemaker site for redness, swelling, and drainage. Call the device clinic at [phone] if you experience these symptoms or fever/chills.  If your incision is closed with Dermabond/Surgical glue. You may shower 1 day after your pacemaker implant and wash around the site with soap and water.    If you were discharged in a sling, please do not wear this during the day more than 48 hours after your surgery unless otherwise instructed. This may increase the risk of stiffness and soreness in your shoulder.   Avoid lotions, ointments, or perfumes over your incision until it is well-healed.  You may use a hot tub or a pool AFTER your wound check appointment if the incision is completely closed.  Pacemaker Alerts:  Some alerts are vibratory and others beep. These are NOT emergencies. Please call our office to let us know. If this occurs at night or on weekends, it can wait until the next business day. Send a remote transmission.  If your device is  capable of reading fluid status (for heart failure), you will be offered monthly monitoring to review this with you.   DEVICE MANAGEMENT Remote monitoring is used to monitor your pacemaker from home. This monitoring is scheduled every 91 days by our office. It allows [place] to keep an eye on the functioning of your device to ensure it is working properly. You will routinely see your Electrophysiologist annually (more often if necessary).   You should receive your ID card for your new device in 4-8 weeks. Keep this card with you at all times once received. Consider wearing a medical alert bracelet or necklace.  Your Pacemaker may be MRI compatible. This will be discussed at your next office visit/wound check.  You should avoid contact with strong electric or magnetic fields.   Do not use amateur (ham) radio equipment or electric (arc) welding torches. MP3 player headphones with magnets should not be used. Some devices are safe to use if held at least 12 inches (30 cm) from your Pacemaker. These include power tools, lawn mowers, and speakers. If you are unsure if something is safe to use, ask your health care provider.  When using your cell phone, hold it to the ear that is on the opposite side from the Pacemaker. Do not leave your cell phone in a pocket over the Pacemaker.  You may safely use electric blankets, heating pads, computers, and microwave ovens.  Call the office right away if:  You have chest pain. You feel more short of breath than you have felt before. You feel more light-headed than you have felt before. Your incision starts to open up.  This information is not intended to replace advice given to you by your health care provider. Make sure you discuss any questions you have with your health care provider.

## 2022-12-19 NOTE — Interval H&P Note (Signed)
History and Physical Interval Note:  12/19/2022 9:22 AM  Derek Washington  has presented today for surgery, with the diagnosis of cardiomyopathy.  The various methods of treatment have been discussed with the patient and family. After consideration of risks, benefits and other options for treatment, the patient has consented to  Procedure(s): BIV UPGRADE (N/A) as a surgical intervention.  The patient's history has been reviewed, patient examined, no change in status, stable for surgery.  I have reviewed the patient's chart and labs.  Questions were answered to the patient's satisfaction.     Sherryl Manges

## 2022-12-21 ENCOUNTER — Encounter (HOSPITAL_COMMUNITY): Payer: Self-pay | Admitting: Internal Medicine

## 2022-12-21 MED FILL — Lidocaine HCl Local Inj 1%: INTRAMUSCULAR | Qty: 30 | Status: AC

## 2022-12-21 MED FILL — Lidocaine HCl Local Inj 1%: INTRAMUSCULAR | Qty: 20 | Status: AC

## 2022-12-26 NOTE — H&P (Signed)
d 

## 2022-12-28 DIAGNOSIS — H348322 Tributary (branch) retinal vein occlusion, left eye, stable: Secondary | ICD-10-CM | POA: Diagnosis not present

## 2022-12-28 DIAGNOSIS — H34831 Tributary (branch) retinal vein occlusion, right eye, with macular edema: Secondary | ICD-10-CM | POA: Diagnosis not present

## 2022-12-28 DIAGNOSIS — H3562 Retinal hemorrhage, left eye: Secondary | ICD-10-CM | POA: Diagnosis not present

## 2022-12-28 DIAGNOSIS — H35372 Puckering of macula, left eye: Secondary | ICD-10-CM | POA: Diagnosis not present

## 2022-12-28 DIAGNOSIS — H43822 Vitreomacular adhesion, left eye: Secondary | ICD-10-CM | POA: Diagnosis not present

## 2022-12-28 NOTE — H&P (Signed)
See updated H&P

## 2023-01-01 ENCOUNTER — Other Ambulatory Visit: Payer: Medicare Other

## 2023-01-01 NOTE — Patient Instructions (Signed)
   After Your Pacemaker   Monitor your pacemaker site for redness, swelling, and drainage. Call the device clinic at [phone] if you experience these symptoms or fever/chills.  Your incision was closed with Dermabond:  You may shower 1 day after your defibrillator implant and wash your incision with soap and water. Avoid lotions, ointments, or perfumes over your incision until it is well-healed.  You may use a hot tub or a pool after your wound check appointment if the incision is completely closed.  Do not lift, push or pull greater than 10 pounds with the affected arm until 6 weeks after your procedure. UNTIL AFTER AUGUST 14TH. There are no other restrictions in arm movement after your wound check appointment.  You may drive, unless driving has been restricted by your healthcare providers.  Remote monitoring is used to monitor your pacemaker from home. This monitoring is scheduled every 91 days by our office. It allows [place] to keep an eye on the functioning of your device to ensure it is working properly. You will routinely see your Electrophysiologist annually (more often if necessary).

## 2023-01-02 ENCOUNTER — Ambulatory Visit: Payer: Medicare Other

## 2023-01-02 ENCOUNTER — Ambulatory Visit: Payer: Medicare Other | Attending: Internal Medicine

## 2023-01-02 DIAGNOSIS — I442 Atrioventricular block, complete: Secondary | ICD-10-CM

## 2023-01-02 DIAGNOSIS — Z79899 Other long term (current) drug therapy: Secondary | ICD-10-CM

## 2023-01-02 DIAGNOSIS — E875 Hyperkalemia: Secondary | ICD-10-CM | POA: Diagnosis not present

## 2023-01-02 LAB — CUP PACEART INCLINIC DEVICE CHECK
Battery Remaining Longevity: 84 mo
Battery Voltage: 3.18 V
Brady Statistic AP VP Percent: 93.16 %
Brady Statistic AP VS Percent: 0.05 %
Brady Statistic AS VP Percent: 5.73 %
Brady Statistic AS VS Percent: 1.05 %
Brady Statistic RA Percent Paced: 93.66 %
Brady Statistic RV Percent Paced: 98.9 %
Date Time Interrogation Session: 20240717160853
Implantable Lead Connection Status: 753985
Implantable Lead Connection Status: 753985
Implantable Lead Connection Status: 753985
Implantable Lead Implant Date: 20220622
Implantable Lead Implant Date: 20220622
Implantable Lead Implant Date: 20240703
Implantable Lead Location: 753858
Implantable Lead Location: 753859
Implantable Lead Location: 753860
Implantable Lead Model: 3830
Implantable Lead Model: 5076
Implantable Pulse Generator Implant Date: 20240703
Lead Channel Impedance Value: 1140 Ohm
Lead Channel Impedance Value: 1178 Ohm
Lead Channel Impedance Value: 1330 Ohm
Lead Channel Impedance Value: 1387 Ohm
Lead Channel Impedance Value: 247 Ohm
Lead Channel Impedance Value: 380 Ohm
Lead Channel Impedance Value: 456 Ohm
Lead Channel Impedance Value: 456 Ohm
Lead Channel Impedance Value: 589 Ohm
Lead Channel Impedance Value: 627 Ohm
Lead Channel Impedance Value: 627 Ohm
Lead Channel Impedance Value: 874 Ohm
Lead Channel Impedance Value: 874 Ohm
Lead Channel Impedance Value: 931 Ohm
Lead Channel Pacing Threshold Amplitude: 0.75 V
Lead Channel Pacing Threshold Amplitude: 1.25 V
Lead Channel Pacing Threshold Amplitude: 1.375 V
Lead Channel Pacing Threshold Pulse Width: 0.4 ms
Lead Channel Pacing Threshold Pulse Width: 0.4 ms
Lead Channel Pacing Threshold Pulse Width: 0.7 ms
Lead Channel Sensing Intrinsic Amplitude: 1.125 mV
Lead Channel Sensing Intrinsic Amplitude: 2.875 mV
Lead Channel Setting Pacing Amplitude: 2 V
Lead Channel Setting Pacing Amplitude: 2.25 V
Lead Channel Setting Pacing Amplitude: 2.5 V
Lead Channel Setting Pacing Pulse Width: 0.4 ms
Lead Channel Setting Pacing Pulse Width: 1 ms
Lead Channel Setting Sensing Sensitivity: 4 mV
Zone Setting Status: 755011

## 2023-01-02 NOTE — Progress Notes (Signed)
Wound check appointment. Steri-strips removed. Wound without redness or edema. Incision edges approximated, wound well healed. Normal device function. CRT P- BIVPacing 17% LV lead not capturing consistently.  RA/RV Thresholds, sensing, and impedances consistent with implant measurements. Device programmed at appropriate safety margins. Histogram distribution appropriate for patient and level of activity. No mode switches or high ventricular rates noted. Patient educated about wound care, arm mobility, lifting restrictions. ROV in 3 months with implanting physician.  PROGRAMMING CHANGES:  (reviewed RA lead impedance and CRT pacing persentage alerts with Shari Prows (MDT rep) 1. RA output programmed from 3.5 @0 .4 to ADAPTIVE  2.25 @ 0.7.  RA threshold test:  1.25@0 .7 (chronic lead) 2.  RV output programmed from 3.5@0 .4 to ADAPTIVE 2.0@. .  RV threshold test: 0.75 @ .4 (Chronic lead) 3.  LV output:  not capturing at 1.5@0 .6 resulting in decreased BIVP%.  LV threshold test today:  2.25@1 .0ms. Re-programmed to 2.50@1 .0ms.     *Note: RA impedance (unipolar) warning.  Reviewed with Medtronic Shari Prows) this is patient's baseline and trends consistent with prior device readings.  Will continue to monitor but no concerns for lead issue at this time.

## 2023-01-03 LAB — BASIC METABOLIC PANEL
BUN/Creatinine Ratio: 22 (ref 10–24)
BUN: 27 mg/dL (ref 8–27)
CO2: 25 mmol/L (ref 20–29)
Calcium: 9.9 mg/dL (ref 8.6–10.2)
Chloride: 102 mmol/L (ref 96–106)
Creatinine, Ser: 1.25 mg/dL (ref 0.76–1.27)
Glucose: 95 mg/dL (ref 70–99)
Potassium: 4.3 mmol/L (ref 3.5–5.2)
Sodium: 141 mmol/L (ref 134–144)
eGFR: 58 mL/min/{1.73_m2} — ABNORMAL LOW (ref >59)

## 2023-01-07 ENCOUNTER — Encounter (HOSPITAL_COMMUNITY): Payer: Self-pay | Admitting: Cardiology

## 2023-01-07 ENCOUNTER — Ambulatory Visit (HOSPITAL_COMMUNITY)
Admission: RE | Admit: 2023-01-07 | Discharge: 2023-01-07 | Disposition: A | Discharge status: Home / Self Care | Payer: Medicare Other | Source: Ambulatory Visit | Attending: Cardiology | Admitting: Cardiology

## 2023-01-07 VITALS — BP 90/50 | HR 63

## 2023-01-07 DIAGNOSIS — I480 Paroxysmal atrial fibrillation: Secondary | ICD-10-CM | POA: Diagnosis not present

## 2023-01-07 DIAGNOSIS — I110 Hypertensive heart disease with heart failure: Secondary | ICD-10-CM | POA: Insufficient documentation

## 2023-01-07 DIAGNOSIS — Q231 Congenital insufficiency of aortic valve: Secondary | ICD-10-CM | POA: Insufficient documentation

## 2023-01-07 DIAGNOSIS — N179 Acute kidney failure, unspecified: Secondary | ICD-10-CM | POA: Insufficient documentation

## 2023-01-07 DIAGNOSIS — Z79899 Other long term (current) drug therapy: Secondary | ICD-10-CM | POA: Diagnosis not present

## 2023-01-07 DIAGNOSIS — I5022 Chronic systolic (congestive) heart failure: Secondary | ICD-10-CM | POA: Diagnosis not present

## 2023-01-07 DIAGNOSIS — Z7901 Long term (current) use of anticoagulants: Secondary | ICD-10-CM | POA: Diagnosis not present

## 2023-01-07 DIAGNOSIS — I2510 Atherosclerotic heart disease of native coronary artery without angina pectoris: Secondary | ICD-10-CM | POA: Insufficient documentation

## 2023-01-07 DIAGNOSIS — Z953 Presence of xenogenic heart valve: Secondary | ICD-10-CM | POA: Diagnosis not present

## 2023-01-07 DIAGNOSIS — I7121 Aneurysm of the ascending aorta, without rupture: Secondary | ICD-10-CM | POA: Diagnosis not present

## 2023-01-07 MED ORDER — SPIRONOLACTONE 25 MG PO TABS: 25.0000 mg | ORAL_TABLET | Freq: Every day | ORAL | 3 refills | Status: DC

## 2023-01-07 NOTE — Patient Instructions (Signed)
Medication Changes:  INCREASE SPIRONOLACTONE TO 25MG  DAILY   Lab Work:  RETURN FOR LAB WORK ON AUGUST 1ST AT 11AM   Testing/Procedures:  Your physician has requested that you have an echocardiogram IN 4 MONTHS RIGHT BEFORE FOLLOW UP. Echocardiography is a painless test that uses sound waves to create images of your heart. It provides your doctor with information about the size and shape of your heart and how well your heart's chambers and valves are working. You may receive an ultrasound enhancing agent through an IV if needed to better visualize your heart during the echo.This procedure takes approximately one hour. There are no restrictions for this procedure. SOMEONE WILL CALL YOU TO GET YOU SCHEDULED FOR THIS    Follow-Up in: 4 MONTHS WITH DR. Shirlee Latch PLEASE CALL OUR OFFICE AROUND SEPTEMBER TO GET SCHEDULED FOR YOUR APPOINTMENT. PHONE NUMBER IS [phone] OPTION 2    At the [facility], you and your health needs are our priority. We have a designated team specialized in the treatment of Heart Failure. This Care Team includes your primary Heart Failure Specialized Cardiologist (physician), Advanced Practice Providers (APPs- Physician Assistants and Nurse Practitioners), and Pharmacist who all work together to provide you with the care you need, when you need it.   You may see any of the following providers on your designated Care Team at your next follow up:  Dr. Arvilla Meres Dr. Marca Ancona Dr. Marcos Eke, NP Robbie Lis, [place] [facility] [place], [place] Brynda Peon, NP Karle Plumber, PharmD   Please be sure to bring in all your medications bottles to every appointment.   Need to Contact us:  If you have any questions or concerns before your next appointment please send [place] a message through [place] or call our office at [phone].    TO LEAVE A MESSAGE FOR THE NURSE SELECT OPTION 2, PLEASE LEAVE A MESSAGE INCLUDING: YOUR  NAME DATE OF BIRTH CALL BACK NUMBER REASON FOR CALL**this is important as we prioritize the call backs  YOU WILL RECEIVE A CALL BACK THE SAME DAY AS LONG AS YOU CALL BEFORE 4:00 PM

## 2023-01-07 NOTE — Progress Notes (Signed)
PCP: Emilio Aspen, MD Cardiology: Dr. Eldridge Dace HF Cardiology: Dr. Shirlee Latch  82 y.o. with history of bicuspid aortic valve s/p bioprosthetic AVR, bradycardia with Medtronic PPM, paroxysmal atrial fibrillation/flutter, and chronic HF with mid range EF was referred by Dr. Eldridge Dace for evaluation for possible cardiac amyloidosis. Patient had bioprosthetic aortic valve replacement in 6/22.  Pre-op, cath showed minimal CAD in 5/22.  Post-op, he developed symptomatic bradycardia and had Medtronic PPM placed.  He is primarily v-paced now.  He has paroxysmal atrial fibrillation but is generally in NSR.  Initial echo in 8/22 post-AVR showed EF 55-60%.  However, echoes in 9/23 and 12/23 showed EF 40-45%, mild LVH, mild RV enlargement with low normal systolic function, bioprosthetic aortic valve with mean gradient 16 mmHg. He had a PYP scan in 2/24 that was grade 1 with H/CL ratio > 1.5 but patient has increased uptake in the sternum and the left lateral ribs which may have falsely elevated the H/CL ration based on sample volume location.  This study seems at most equivocal.  Patient does report a bad fall onto his left side with rib pain towards the end of 2023.  Bone survey was done that was normal in 2/24.    Cardiac MRI in 5/24 showed LV EF 33%, moderate LVh, septal hypokinesis and septal-lateral dyssynchrony, RV EF 46%, ECV 25%, no LGE.    Patient had upgrade of device to Medtronic CRT-P in 7/24.   Patient returns for followup of CHF.  He feels better since his device was upgraded. No significant exertional dyspnea.  He walks most days for exercise.  No chest pain.  No lightheadedness though SBP 90 today.   ECG (personally reviewed): A-BiV pacing  Labs (1/24): SPEP normal, TSH normal, pro-BNP 1544, K 4.5, creatinine 1.19 Labs (2/24): Myeloma panel negative, urine immunofixation negative, K 4.3, creatinine 1.19, BNP 362 Labs (7/24): K 4.3, creatinine 1.25  Medtronic device interrogation: No AF, >95%  BiV pacing  PMH: 1. Bicuspid aortic valve disorder: With severe AS, s/p bioprosthetic aortic valve replacement 6/22.  - 4.2 cm ascending aorta by CTA 8/23.  2. AKI in setting of IV contrast with CTA in 8/23, required transient HD.  3. Nephrolithiasis 4. HTN 5. Gout 6. BPPV 7. Paroxysmal atrial fibrillation/flutter.  8. Bradycardia: Post-op AVR, has Medtronic CRT-P device.   9. CAD: LHC in 5/22 with 50% stenosis in PLV branch, otherwise no significant disease.  10. Chronic HF with mid-range EF: Echo in 8/22 post-AVR with EF 55-60%.  - Echo (9/23): EF 40-45% - Echo (12/23): EF 40-45%, mild LVH, mild RV enlargement with low normal function, bioprosthetic aortic valve with mean gradient 16 mmHg.  - PYP scan (2/24): Grade 1, H/CL ratio > 1.5 but patient has increased uptake in the sternum and the left lateral ribs which may have falsely elevated the H/CL ration based on sample volume location.  This study seems at most equivocal. Bone survey done in 2/24 was normal.  - Cardiac MRI (5/24): LV EF 33%, moderate LVh, septal hypokinesis and septal-lateral dyssynchrony, RV EF 46%, ECV 25%, no LGE.   - Device upgraded to Medtronic CRT-P (7/24) 11. Spinal stenosis  SH: Retired [employer], married, no smoking, rare ETOH.   Family History  Problem Relation Age of Onset   Other Mother 63       HEAVY SMOKER   COPD Father 72       HEAVY SMOKER   Other Sister 34       HEAVY SMOKER  ROS: All systems reviewed and negative except as per HPI.   Current Outpatient Medications  Medication Sig Dispense Refill   acetaminophen (TYLENOL) 325 MG tablet Take 2 tablets (650 mg total) by mouth every 6 (six) hours as needed for mild pain (or Fever >/= 101).     apixaban (ELIQUIS) 5 MG TABS tablet Take 1 tablet (5 mg total) by mouth 2 (two) times daily. 180 tablet 3   atorvastatin (LIPITOR) 20 MG tablet Take 20 mg by mouth every evening.     carvedilol (COREG) 6.25 MG tablet Take 6.25 mg by mouth 2 (two)  times daily with a meal. 1/2 tab am   1 tab in pm     cholecalciferol (VITAMIN D) 25 MCG (1000 UNIT) tablet Take 1,000 Units by mouth in the morning.     dapagliflozin propanediol (FARXIGA) 10 MG TABS tablet Take 1 tablet (10 mg total) by mouth daily before breakfast. 30 tablet 11   finasteride (PROSCAR) 5 MG tablet Take 5 mg by mouth in the morning.     furosemide (LASIX) 40 MG tablet Take 40 mg by mouth daily.     Multiple Vitamin (MULTIVITAMIN WITH MINERALS) TABS tablet Take 1 tablet by mouth in the morning.     Omega-3 Fatty Acids (FISH OIL) 1000 MG CAPS Take 1,000 mg by mouth 2 (two) times daily.     sacubitril-valsartan (ENTRESTO) 24-26 MG Take 1 tablet by mouth 2 (two) times daily. 60 tablet 11   spironolactone (ALDACTONE) 25 MG tablet Take 1 tablet (25 mg total) by mouth daily. 90 tablet 3   No current facility-administered medications for this encounter.   BP (!) 90/50   Pulse 63   SpO2 95%  General: NAD Neck: No JVD, no thyromegaly or thyroid nodule.  Lungs: Clear to auscultation bilaterally with normal respiratory effort. CV: Nondisplaced PMI.  Heart regular S1/S2, no S3/S4, no murmur.  No peripheral edema.  No carotid bruit.  Normal pedal pulses.  Abdomen: Soft, nontender, no hepatosplenomegaly, no distention.  Skin: Intact without lesions or rashes.  Neurologic: Alert and oriented x 3.  Psych: Normal affect. Extremities: No clubbing or cyanosis.  HEENT: Normal.   Assessment/Plan: 1. Chronic HF with mid range EF:  Echo in 9/23 and again in 12/23 showed EF 40-45%, mild LVH, mild RV enlargement with low normal function, bioprosthetic aortic valve with mean gradient 16 mmHg.  This is down from EF 55-60% on 8/22 echo.  Pre-AVR cath in 5/22 showed only mild CAD and he has had no chest pain, think ischemic CMP is unlikely.  Cardiac amyloidosis has been raised as a concern.  PYP scan in 2/24 was abnormal, but I think this study is at most equivocal. It was read as grade 1, H/CL  ratio > 1.5 but patient has increased uptake in the sternum and the left lateral ribs which may have falsely elevated the H/CL ration based on sample volume location (bone survey done later in 2/24 was normal).  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  Cardiac MRI in 5/24 was not suggestive of cardiac amyloidosis, showing LV EF 33%, moderate LVh, septal hypokinesis and septal-lateral dyssynchrony, RV EF 46%, ECV 25%, no LGE.  He had a device upgrade to MDT CRT-P in 7/24 and is BiV pacing appropriately today.  He feels better since device upgrade, NYHA class I-II.  He is not volume overloaded on exam.    - Continue Lasix 40 mg  daily.   - Continue Coreg 6.25 mg bid.  - Continue Entresto 24/26 bid.  - Continue Farxiga 10 mg daily.  - Increase spironolactone to 25 mg at bedtime, BMET in 10 days.  - I will arrange for echo at followup to look for improvement with CRT-P.  2. Bioprosthetic aortic valve: This was stable on 12/23 echo.  3. HTN:  BP actually low today.  4. Atrial fibrillation: Paroxysmal. NSR today.   - Continue apixaban.  5. CAD: Mild on 5/22 cath.  - Continue atorvastatin.  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  8/23 CTA chest 4.2 cm.  Patient had AKI with CT contrast, would avoid in future (could do MRA chest).  - I will arrange for MRA chest in 8/24 to followup aortic aneurysm.   Followup 4 months with echo   Marca Ancona 01/07/2023

## 2023-01-08 DIAGNOSIS — M48062 Spinal stenosis, lumbar region with neurogenic claudication: Secondary | ICD-10-CM | POA: Diagnosis not present

## 2023-01-17 ENCOUNTER — Ambulatory Visit (HOSPITAL_COMMUNITY)
Admission: RE | Admit: 2023-01-17 | Discharge: 2023-01-17 | Disposition: A | Discharge status: Home / Self Care | Payer: Medicare Other | Source: Ambulatory Visit | Attending: Cardiology | Admitting: Cardiology

## 2023-01-17 DIAGNOSIS — I5022 Chronic systolic (congestive) heart failure: Secondary | ICD-10-CM | POA: Insufficient documentation

## 2023-01-17 LAB — BASIC METABOLIC PANEL
Anion gap: 9 (ref 5–15)
BUN: 27 mg/dL — ABNORMAL HIGH (ref 8–23)
CO2: 24 mmol/L (ref 22–32)
Calcium: 9.6 mg/dL (ref 8.9–10.3)
Chloride: 105 mmol/L (ref 98–111)
Creatinine, Ser: 1.25 mg/dL — ABNORMAL HIGH (ref 0.61–1.24)
GFR, Estimated: 58 mL/min — ABNORMAL LOW (ref >60)
Glucose, Bld: 126 mg/dL — ABNORMAL HIGH (ref 70–99)
Potassium: 4 mmol/L (ref 3.5–5.1)
Sodium: 138 mmol/L (ref 135–145)

## 2023-01-18 DIAGNOSIS — Z0000 Encounter for general adult medical examination without abnormal findings: Secondary | ICD-10-CM | POA: Diagnosis not present

## 2023-01-18 DIAGNOSIS — E559 Vitamin D deficiency, unspecified: Secondary | ICD-10-CM | POA: Diagnosis not present

## 2023-01-18 DIAGNOSIS — Z950 Presence of cardiac pacemaker: Secondary | ICD-10-CM | POA: Diagnosis not present

## 2023-01-18 DIAGNOSIS — D6869 Other thrombophilia: Secondary | ICD-10-CM | POA: Diagnosis not present

## 2023-01-18 DIAGNOSIS — I7121 Aneurysm of the ascending aorta, without rupture: Secondary | ICD-10-CM | POA: Diagnosis not present

## 2023-01-18 DIAGNOSIS — I5022 Chronic systolic (congestive) heart failure: Secondary | ICD-10-CM | POA: Diagnosis not present

## 2023-01-18 DIAGNOSIS — K219 Gastro-esophageal reflux disease without esophagitis: Secondary | ICD-10-CM | POA: Diagnosis not present

## 2023-01-18 DIAGNOSIS — I110 Hypertensive heart disease with heart failure: Secondary | ICD-10-CM | POA: Diagnosis not present

## 2023-01-18 DIAGNOSIS — I4891 Unspecified atrial fibrillation: Secondary | ICD-10-CM | POA: Diagnosis not present

## 2023-01-18 DIAGNOSIS — M109 Gout, unspecified: Secondary | ICD-10-CM | POA: Diagnosis not present

## 2023-01-18 DIAGNOSIS — E785 Hyperlipidemia, unspecified: Secondary | ICD-10-CM | POA: Diagnosis not present

## 2023-01-29 ENCOUNTER — Encounter: Payer: Self-pay | Admitting: Interventional Cardiology

## 2023-02-01 NOTE — Progress Notes (Signed)
Pt had pacemaker assoc cardiomyopathy

## 2023-02-21 ENCOUNTER — Other Ambulatory Visit: Payer: Self-pay | Admitting: Interventional Cardiology

## 2023-02-21 NOTE — Telephone Encounter (Signed)
Prescription refill request for Eliquis received. Indication: AVR/PAF Last office visit: 01/07/23  [address] MD Scr: 1.25 on 01/17/23  Epic Age: 82 Weight: 98.1kg  Based on above findings Eliquis 5mg  twice daily is the appropriate dose.  Refill approved.

## 2023-02-27 ENCOUNTER — Other Ambulatory Visit (HOSPITAL_BASED_OUTPATIENT_CLINIC_OR_DEPARTMENT_OTHER): Payer: Self-pay

## 2023-03-12 NOTE — Progress Notes (Signed)
Remote pacemaker transmission.   

## 2023-03-25 ENCOUNTER — Ambulatory Visit: Payer: Medicare Other | Attending: Internal Medicine | Admitting: Internal Medicine

## 2023-03-25 ENCOUNTER — Encounter: Payer: Self-pay | Admitting: Internal Medicine

## 2023-03-25 VITALS — BP 116/74 | HR 64 | Ht 72.0 in | Wt 216.8 lb

## 2023-03-25 DIAGNOSIS — Z950 Presence of cardiac pacemaker: Secondary | ICD-10-CM | POA: Diagnosis not present

## 2023-03-25 DIAGNOSIS — I442 Atrioventricular block, complete: Secondary | ICD-10-CM | POA: Diagnosis not present

## 2023-03-25 DIAGNOSIS — I480 Paroxysmal atrial fibrillation: Secondary | ICD-10-CM | POA: Diagnosis not present

## 2023-03-25 DIAGNOSIS — I498 Other specified cardiac arrhythmias: Secondary | ICD-10-CM

## 2023-03-25 LAB — CUP PACEART INCLINIC DEVICE CHECK
Battery Remaining Longevity: 92 mo
Battery Voltage: 3.13 V
Brady Statistic AP VP Percent: 83.45 %
Brady Statistic AP VS Percent: 0 %
Brady Statistic AS VP Percent: 16.39 %
Brady Statistic AS VS Percent: 0.15 %
Brady Statistic RA Percent Paced: 83.5 %
Brady Statistic RV Percent Paced: 99.84 %
Date Time Interrogation Session: 20241007173334
Implantable Lead Connection Status: 753985
Implantable Lead Connection Status: 753985
Implantable Lead Connection Status: 753985
Implantable Lead Implant Date: 20220622
Implantable Lead Implant Date: 20220622
Implantable Lead Implant Date: 20240703
Implantable Lead Location: 753858
Implantable Lead Location: 753859
Implantable Lead Location: 753860
Implantable Lead Model: 3830
Implantable Lead Model: 5076
Implantable Pulse Generator Implant Date: 20240703
Lead Channel Impedance Value: 1026 Ohm
Lead Channel Impedance Value: 1444 Ohm
Lead Channel Impedance Value: 1577 Ohm
Lead Channel Impedance Value: 1577 Ohm
Lead Channel Impedance Value: 1634 Ohm
Lead Channel Impedance Value: 1691 Ohm
Lead Channel Impedance Value: 1729 Ohm
Lead Channel Impedance Value: 266 Ohm
Lead Channel Impedance Value: 437 Ohm
Lead Channel Impedance Value: 494 Ohm
Lead Channel Impedance Value: 608 Ohm
Lead Channel Impedance Value: 722 Ohm
Lead Channel Impedance Value: 874 Ohm
Lead Channel Impedance Value: 931 Ohm
Lead Channel Pacing Threshold Amplitude: 0.625 V
Lead Channel Pacing Threshold Amplitude: 1.25 V
Lead Channel Pacing Threshold Amplitude: 1.5 V
Lead Channel Pacing Threshold Amplitude: 1.625 V
Lead Channel Pacing Threshold Pulse Width: 0.4 ms
Lead Channel Pacing Threshold Pulse Width: 0.4 ms
Lead Channel Pacing Threshold Pulse Width: 0.7 ms
Lead Channel Pacing Threshold Pulse Width: 1 ms
Lead Channel Sensing Intrinsic Amplitude: 1.25 mV
Lead Channel Sensing Intrinsic Amplitude: 2 mV
Lead Channel Setting Pacing Amplitude: 2 V
Lead Channel Setting Pacing Amplitude: 2.5 V
Lead Channel Setting Pacing Amplitude: 2.5 V
Lead Channel Setting Pacing Pulse Width: 0.4 ms
Lead Channel Setting Pacing Pulse Width: 1 ms
Lead Channel Setting Sensing Sensitivity: 4 mV
Zone Setting Status: 755011

## 2023-03-25 MED ORDER — SPIRONOLACTONE 25 MG PO TABS: 25.0000 mg | ORAL_TABLET | Freq: Every day | ORAL | Status: DC

## 2023-03-25 MED ORDER — ENTRESTO 24-26 MG PO TABS: ORAL_TABLET | ORAL | 11 refills | Status: DC

## 2023-03-25 NOTE — Progress Notes (Unsigned)
Patient Care Team: Emilio Aspen, MD as PCP - General (Internal Medicine) [name], MD as PCP - Cardiology (Cardiology)   HPI  Derek Washington is a 82 y.o. 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 fib and flutter anticoagulated with apixaban.  Now Device dependent  Seeing 4/24 with complaints of shortness of breath.  With new cardiomyopathy--temporally assoc with RV pacing , albeit with attempt at LBBa pacing the QRSd ended up 170 ms Progressive left ventricular dysfunction and underwent CRT-P upgrade; no change in QRS duration but electrical resynchronization with an upright QRS lead V1 and negative QRS lead I  Overall significantly improved in terms of functional capacity.  Less dyspnea.  Just trace edema.  No nocturnal dyspnea or shortness of breath.  Has exertional wobbliness and some orthostatic lightheadedness.  Systolic blood pressures at home following his walk are recorded between the 80s and 110s    has also noted left leg weakness over the last 3 or 4 months.   We had referred him to [facility] and he is scheduled for MRIs later this week     DATE TEST EF   5/22 LHC    % Non obstruc CAD X 50% RPAV  5/22 Echo  55-60%   8/22 Echo   55-65 %   9/23 Echo  40-45%   12/23 Echo   40-45%   5/24 cMRI 33% Concentric LVH    Date Cr K Hgb  6/22 1.13 4.1 9.6   7/23 1.08 4.2 13.1  4/24 1.22 4.2 13.6     Records and Results Reviewed   Past Medical History:  Diagnosis Date   Bicuspid aortic valve    LAST ECHOCARDIOGRAM APPROXIMATELY 2016? MILD AORTIC REGURG   BPH (benign prostatic hyperplasia)    Hard of hearing    Heart murmur    High cholesterol    Hydrocele    Hypertension    Insomnia    Low back pain    RIGHT   Mild obesity     Nocturia    Presence of permanent cardiac pacemaker    Right elbow tendonitis    Tinnitus     Past Surgical History:  Procedure Laterality Date   AORTIC VALVE REPLACEMENT N/A 12/01/2020   Procedure: AORTIC VALVE REPLACEMENT (AVR) USING INSPIRIS AORTIC VALVE;  Surgeon: [name], MD;  Location: [facility] OR;  Service: Open Heart Surgery;  Laterality: N/A;   BIV UPGRADE N/A 12/19/2022   Procedure: BIV UPGRADE;  Surgeon: [name], MD;  Location: [facility] INVASIVE CV LAB;  Service: Cardiovascular;  Laterality: N/A;   CYSTOSCOPY WITH URETEROSCOPY, STONE BASKETRY AND STENT PLACEMENT Left 02/05/2022   Procedure: CYSTOSCOPY WITH LEFT URERETAL STENT PLACEMENT, RETROGRADE PYELOGRAM, URETEROSCOPY WITH STONE EXTRACTION, LASER LITHOTRIPSY;  Surgeon: [name], MD;  Location: [facility] ORS;  Service: Urology;  Laterality: Left;   HYDROCELE EXCISION  2017   DR. [name] 2017   IR FLUORO GUIDE CV LINE RIGHT  02/01/2022   IR US GUIDE VASC ACCESS RIGHT  02/01/2022   JOINT REPLACEMENT     left total knee replacement      2007    PACEMAKER IMPLANT N/A 12/07/2020   Procedure: PACEMAKER IMPLANT;  Surgeon: [name], MD;  Location: [facility] INVASIVE CV LAB;  Service: Cardiovascular;  Laterality: N/A;  right knee total replacement      2011   RIGHT/LEFT HEART CATH AND CORONARY ANGIOGRAPHY N/A 11/03/2020   Procedure: RIGHT/LEFT HEART CATH AND CORONARY ANGIOGRAPHY;  Surgeon: [name], MD;  Location: [facility] INVASIVE CV LAB;  Service: Cardiovascular;  Laterality: N/A;   SCROTAL EXPLORATION N/A 12/26/2015   Procedure: SCROTUM EXPLORATION;  Surgeon: [name], MD;  Location: [facility] ORS;  Service: Urology;  Laterality: N/A;  EXCISION OF LEFT EPIDIDYMAL CYST/SPERMATOCELE   TEE WITHOUT CARDIOVERSION N/A 12/01/2020   Procedure: TRANSESOPHAGEAL ECHOCARDIOGRAM (TEE);  Surgeon: [name], MD;  Location: [facility] OR;  Service: Open Heart Surgery;  Laterality: N/A;   TONSILLECTOMY     VASECTOMY     1968    Current Meds  Medication Sig   acetaminophen (TYLENOL) 325 MG tablet Take 2 tablets (650 mg total) by mouth every 6 (six) hours as needed for mild pain (or Fever >/= 101).   apixaban (ELIQUIS) 5 MG TABS tablet TAKE 1 TABLET BY MOUTH TWICE  DAILY   atorvastatin (LIPITOR) 20 MG tablet Take 20 mg by mouth every evening.   carvedilol (COREG) 6.25 MG tablet Take 6.25 mg by mouth 2 (two) times daily with a meal. 1/2 tab am   1 tab in pm   cholecalciferol (VITAMIN D) 25 MCG (1000 UNIT) tablet Take 1,000 Units by mouth in the morning.   dapagliflozin propanediol (FARXIGA) 10 MG TABS tablet Take 1 tablet (10 mg total) by mouth daily before breakfast.   finasteride (PROSCAR) 5 MG tablet Take 5 mg by mouth in the morning.   furosemide (LASIX) 40 MG tablet Take 40 mg by mouth daily.   Multiple Vitamin (MULTIVITAMIN WITH MINERALS) TABS tablet Take 1 tablet by mouth in the morning.   Omega-3 Fatty Acids (FISH OIL) 1000 MG CAPS Take 1,000 mg by mouth 2 (two) times daily.   sacubitril-valsartan (ENTRESTO) 24-26 MG Take 1 tablet by mouth 2 (two) times daily.   spironolactone (ALDACTONE) 25 MG tablet Take 1 tablet (25 mg total) by mouth daily.      Allergies  Allergen Reactions   Iodinated Contrast Media Other (See Comments)    Caused kidney failure requiring dialysis   Hydrochlorothiazide Other (See Comments)    fatigue      Review of Systems negative except from HPI and PMH  Physical Exam BP 116/74   Pulse 64   Ht 6' (1.829 m)   Wt 216 lb 12.8 oz (98.3 kg)   SpO2 97%   BMI 29.40 kg/m  Well developed and well nourished in no acute distress HENT normal Neck supple with JVP-flat Clear Device pocket well healed; without hematoma or erythema.  There is no tethering  Regular rate and rhythm, no  gallop No  murmur Abd-soft with active BS No Clubbing cyanosis  edema Skin-warm and dry A & Oriented  Grossly normal sensory and motor function  ECG sinus with P synchronous pacing with an  upright QRS lead V1 and negative QRS lead I  Device function is normal. Programming changes increase atrial pacing output See Paceart for details     CrCl cannot be calculated (Patient's most recent lab result is older than the maximum 21 days allowed.).   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  Pacemaker related  Hypo-tension and exercise/postprandial lightheadedness  Functional status is significantly improved.  Repeat echo is pending.  Exertional wobbliness and lightheadedness may be related to blood pressure.  Postexercise blood pressures have been in the 80s to 110s region more on the lower side.  They are also exercising following breakfast.  2 different breakfast including high carb breakfast with cereal and milk and the other with a peanut butter sandwich.  Will try to sort out whether 1 of these contribute to a greater degree to post exercise lightheadedness with the mechanism of postprandial hypotension.  Given the low blood pressure and the wobbliness, I have taken the liberty of decreasing his Entresto from 24/26--12/13///24/26 and have asked him to take his carvedilol when he returns from his walk.  Not withstanding the relatively broad QRSd following upgrade, electrical resynchronization is suggested by upright QRS in lead V1 and negative QRS in lead I and is supported by improved symptoms.

## 2023-03-25 NOTE — Patient Instructions (Addendum)
Medication Instructions:  Your physician has recommended you make the following change in your medication:   ** Begin taking Entresto 24-26mg  1/2 tablet by mouth in the morning and 1 tablet by mouth in the evening.   *If you need a refill on your cardiac medications before your next appointment, please call your pharmacy*   Lab Work: None ordered.  If you have labs (blood work) drawn today and your tests are completely normal, you will receive your results only by: MyChart Message (if you have MyChart) OR A paper copy in the mail If you have any lab test that is abnormal or we need to change your treatment, we will call you to review the results.   Testing/Procedures: None ordered.    Follow-Up: At [facility], you and your health needs are our priority.  As part of our continuing mission to provide you with exceptional heart care, we have created designated Provider Care Teams.  These Care Teams include your primary Cardiologist (physician) and Advanced Practice Providers (APPs -  Physician Assistants and Nurse Practitioners) who all work together to provide you with the care you need, when you need it.  We recommend signing up for the patient portal called "MyChart".  Sign up information is provided on this After Visit Summary.  MyChart is used to connect with patients for Virtual Visits (Telemedicine).  Patients are able to view lab/test results, encounter notes, upcoming appointments, etc.  Non-urgent messages can be sent to your provider as well.   To learn more about what you can do with MyChart, go to [email or web address].    Your next appointment:   9 months with Dr Graciela Husbands

## 2023-03-26 ENCOUNTER — Ambulatory Visit: Payer: Medicare Other

## 2023-03-26 DIAGNOSIS — H34831 Tributary (branch) retinal vein occlusion, right eye, with macular edema: Secondary | ICD-10-CM | POA: Diagnosis not present

## 2023-03-26 DIAGNOSIS — I442 Atrioventricular block, complete: Secondary | ICD-10-CM | POA: Diagnosis not present

## 2023-03-26 DIAGNOSIS — H43822 Vitreomacular adhesion, left eye: Secondary | ICD-10-CM | POA: Diagnosis not present

## 2023-03-26 DIAGNOSIS — H348322 Tributary (branch) retinal vein occlusion, left eye, stable: Secondary | ICD-10-CM | POA: Diagnosis not present

## 2023-03-26 DIAGNOSIS — H35372 Puckering of macula, left eye: Secondary | ICD-10-CM | POA: Diagnosis not present

## 2023-03-26 DIAGNOSIS — H3562 Retinal hemorrhage, left eye: Secondary | ICD-10-CM | POA: Diagnosis not present

## 2023-03-27 LAB — CUP PACEART REMOTE DEVICE CHECK
Battery Remaining Longevity: 92 mo
Battery Voltage: 3.12 V
Brady Statistic AP VP Percent: 83.15 %
Brady Statistic AP VS Percent: 0 %
Brady Statistic AS VP Percent: 16.71 %
Brady Statistic AS VS Percent: 0.14 %
Brady Statistic RA Percent Paced: 83.18 %
Brady Statistic RV Percent Paced: 99.86 %
Date Time Interrogation Session: 20241007203149
Implantable Lead Connection Status: 753985
Implantable Lead Connection Status: 753985
Implantable Lead Connection Status: 753985
Implantable Lead Implant Date: 20220622
Implantable Lead Implant Date: 20220622
Implantable Lead Implant Date: 20240703
Implantable Lead Location: 753858
Implantable Lead Location: 753859
Implantable Lead Location: 753860
Implantable Lead Model: 3830
Implantable Lead Model: 5076
Implantable Pulse Generator Implant Date: 20240703
Lead Channel Impedance Value: 1026 Ohm
Lead Channel Impedance Value: 1444 Ohm
Lead Channel Impedance Value: 1577 Ohm
Lead Channel Impedance Value: 1577 Ohm
Lead Channel Impedance Value: 1634 Ohm
Lead Channel Impedance Value: 1691 Ohm
Lead Channel Impedance Value: 1729 Ohm
Lead Channel Impedance Value: 266 Ohm
Lead Channel Impedance Value: 437 Ohm
Lead Channel Impedance Value: 494 Ohm
Lead Channel Impedance Value: 608 Ohm
Lead Channel Impedance Value: 722 Ohm
Lead Channel Impedance Value: 874 Ohm
Lead Channel Impedance Value: 931 Ohm
Lead Channel Pacing Threshold Amplitude: 0.625 V
Lead Channel Pacing Threshold Amplitude: 1.625 V
Lead Channel Pacing Threshold Pulse Width: 0.4 ms
Lead Channel Pacing Threshold Pulse Width: 0.4 ms
Lead Channel Sensing Intrinsic Amplitude: 1.25 mV
Lead Channel Sensing Intrinsic Amplitude: 2 mV
Lead Channel Setting Pacing Amplitude: 2 V
Lead Channel Setting Pacing Amplitude: 2.5 V
Lead Channel Setting Pacing Amplitude: 2.5 V
Lead Channel Setting Pacing Pulse Width: 0.4 ms
Lead Channel Setting Pacing Pulse Width: 1 ms
Lead Channel Setting Sensing Sensitivity: 4 mV
Zone Setting Status: 755011

## 2023-04-11 NOTE — Progress Notes (Signed)
Remote pacemaker transmission.   

## 2023-05-06 ENCOUNTER — Other Ambulatory Visit (HOSPITAL_COMMUNITY): Payer: Self-pay

## 2023-05-06 DIAGNOSIS — I5022 Chronic systolic (congestive) heart failure: Secondary | ICD-10-CM

## 2023-05-06 MED ORDER — FUROSEMIDE 40 MG PO TABS
40.0000 mg | ORAL_TABLET | Freq: Every day | ORAL | 3 refills | Status: DC
Start: 2023-05-06 — End: 2024-04-07

## 2023-05-06 MED ORDER — SPIRONOLACTONE 25 MG PO TABS
25.0000 mg | ORAL_TABLET | Freq: Every day | ORAL | 3 refills | Status: DC
Start: 2023-05-06 — End: 2024-01-23

## 2023-05-16 IMAGING — CT CT ANGIO CHEST
1 series · 16 of 32 positions shown · IV contrast (APPLIED)
Comparison: No prior chest CT. CT the abdomen and pelvis
03/12/2011.

CLINICAL DATA: 80-year-old male with history of severe aortic
stenosis. Preprocedural study prior to potential transcatheter
aortic valve replacement (TAVR) procedure.

EXAM:
CT ANGIOGRAPHY CHEST, ABDOMEN AND PELVIS
TECHNIQUE: Multidetector CT imaging through the chest, abdomen and pelvis was
performed using the standard protocol during bolus administration of
intravenous contrast. Multiplanar reconstructed images and MIPs were
obtained and reviewed to evaluate the vascular anatomy.
CONTRAST:  100mL OMNIPAQUE IOHEXOL 350 MG/ML SOLN

[Series 7: ax thins · axial · 0.59mm/px · z∈[+734,+1447]mm · 16 of 763 slices shown]
[im 25/763  lung]
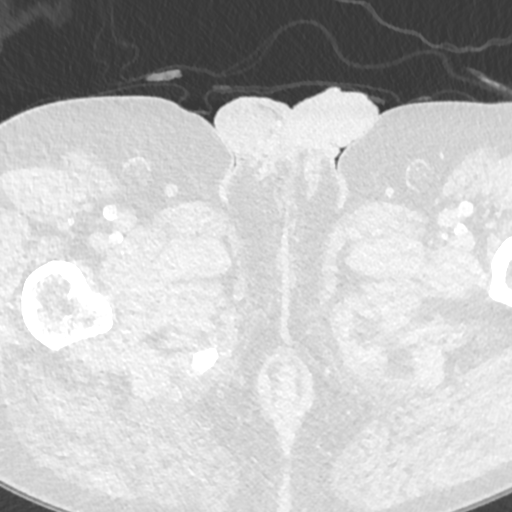
[im 74/763  soft-tissue]
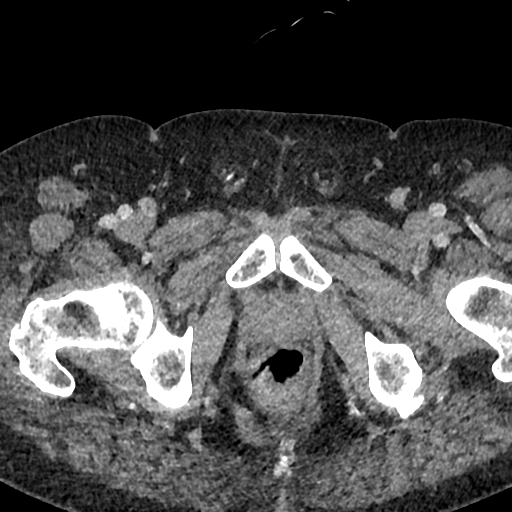
[im 123/763  lung]
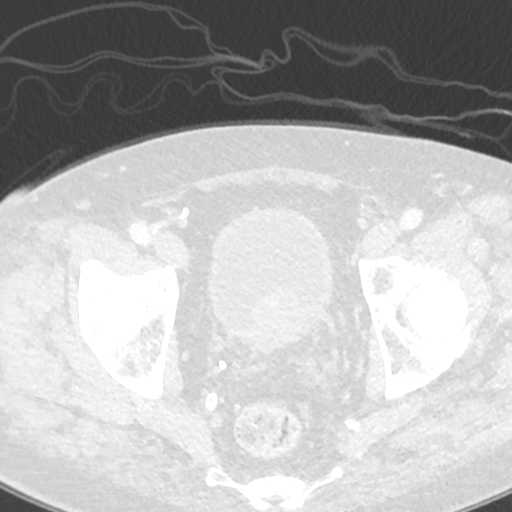
[im 173/763  soft-tissue]
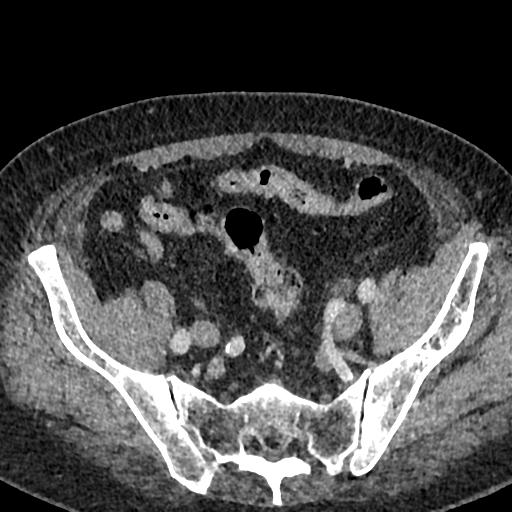
[im 222/763  lung]
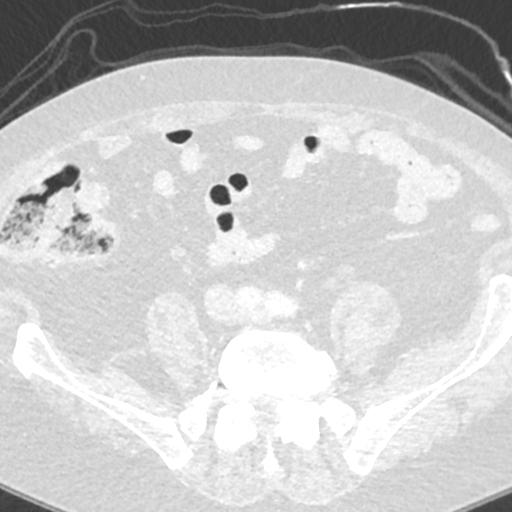
[im 271/763  soft-tissue]
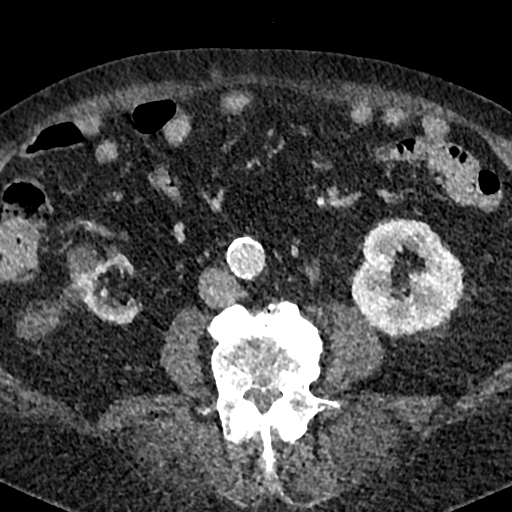
[im 320/763  lung]
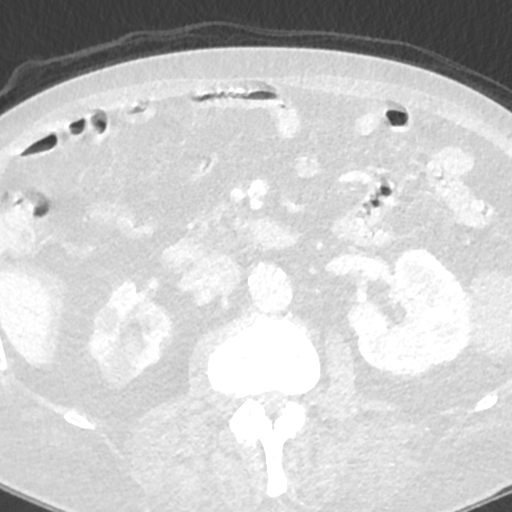
[im 369/763  soft-tissue]
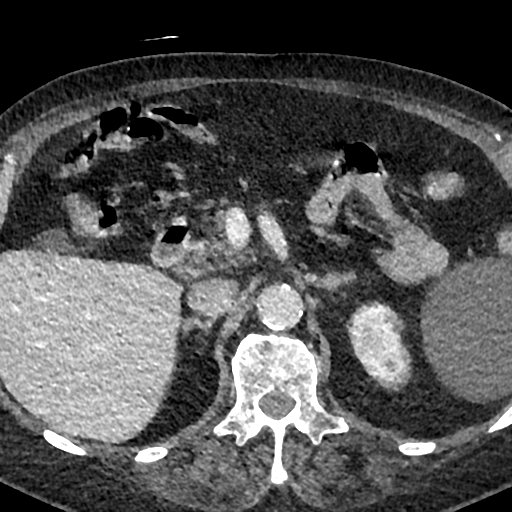
[im 394/763  lung]
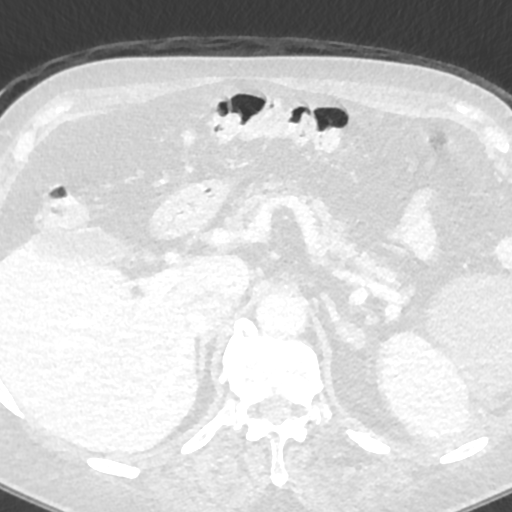
[im 443/763  soft-tissue]
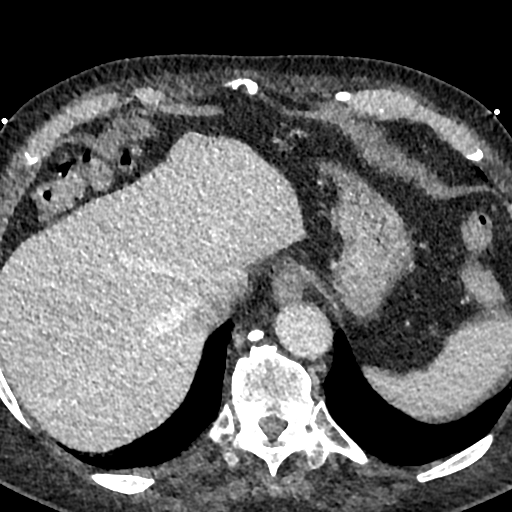
[im 492/763  lung]
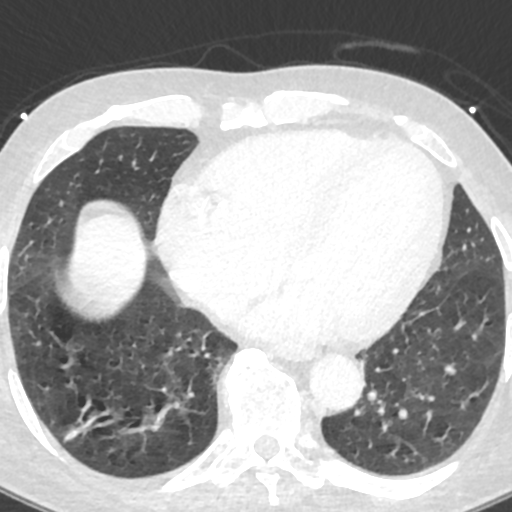
[im 541/763  soft-tissue]
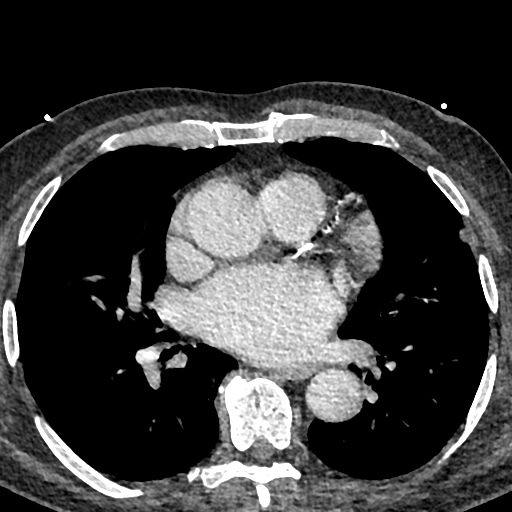
[im 590/763  lung]
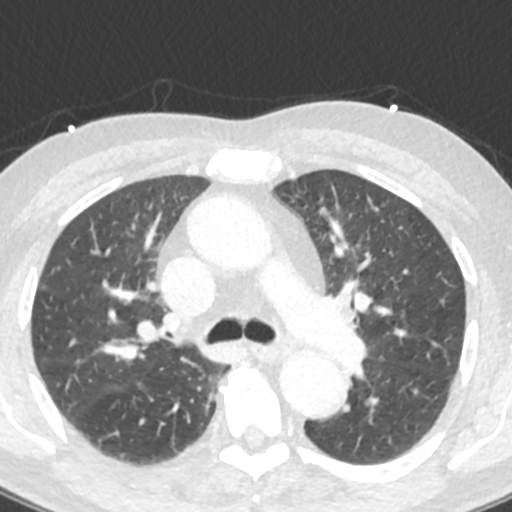
[im 640/763  soft-tissue]
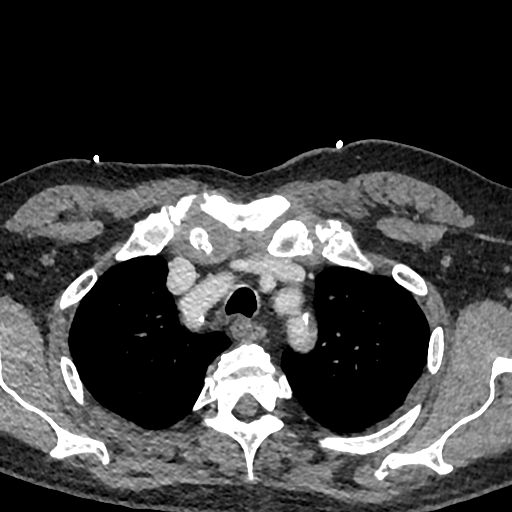
[im 689/763  lung]
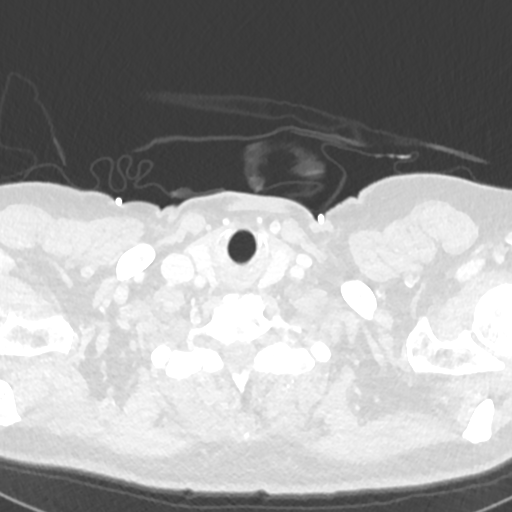
[im 738/763  soft-tissue]
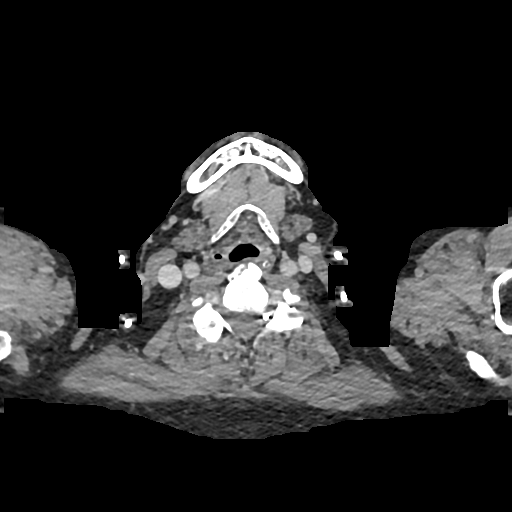

[16 of 32 positions shown; findings below may reference images not displayed]

FINDINGS: CTA CHEST FINDINGS

Cardiovascular: Heart size is mildly enlarged. There is no
significant pericardial fluid, thickening or pericardial
calcification. There is aortic atherosclerosis, as well as
atherosclerosis of the great vessels of the mediastinum and the
coronary arteries, including calcified atherosclerotic plaque in the
left main, left anterior descending, left circumflex and right
coronary arteries. Severe calcifications of the aortic valve.

Mediastinum/Lymph Nodes: No pathologically enlarged mediastinal or
hilar lymph nodes. Esophagus is unremarkable in appearance. No
axillary lymphadenopathy.

Lungs/Pleura: Multiple tiny 2-4 mm pulmonary nodules scattered
throughout the lungs bilaterally, nonspecific, but similar to prior
study from 01/18/2020, statistically likely benign. No other larger
more suspicious appearing pulmonary nodules or masses are noted. No
acute consolidative airspace disease. No pleural effusions.

Musculoskeletal/Soft Tissues: There are no aggressive appearing
lytic or blastic lesions noted in the visualized portions of the
skeleton.

CTA ABDOMEN AND PELVIS FINDINGS

Hepatobiliary: No suspicious cystic or solid hepatic lesions. No
intra or extrahepatic biliary ductal dilatation. Gallbladder is
normal in appearance.

Pancreas: No pancreatic mass. No pancreatic ductal dilatation. No
pancreatic or peripancreatic fluid collections or inflammatory
changes.

Spleen: Unremarkable.

Adrenals/Urinary Tract: Severe atrophy of the right kidney. Numerous
low-attenuation lesions in both kidneys, compatible with simple
cysts, largest of which measures 7.9 cm in diameter. Other
subcentimeter low-attenuation lesions in both kidneys are too small
to definitively characterize, but are statistically likely to
represent tiny cysts. Multiple nonobstructive calculi are noted
within the left renal collecting system measuring up to 7 mm in the
upper pole collecting system of left kidney. No ureteral stones. In
the posterior aspect of the urinary bladder there is a calculus or
cluster of small stones measuring up to 11 mm. No
hydroureteronephrosis. Bilateral adrenal glands are normal in
appearance. Urinary bladder is otherwise unremarkable in appearance.

Stomach/Bowel: The appearance of the stomach is normal. There is no
pathologic dilatation of small bowel or colon. The appendix is not
confidently identified and may be surgically absent. Regardless,
there are no inflammatory changes noted adjacent to the cecum to
suggest the presence of an acute appendicitis at this time.

Vascular/Lymphatic: Aortic atherosclerosis, without evidence of
aneurysm or dissection in the abdominal or pelvic vasculature.
Vascular findings and measurements pertinent to potential TAVR
procedure, as detailed above. No lymphadenopathy noted in the
abdomen or pelvis.

Reproductive: Prostate gland is heterogeneous in appearance and
mildly enlarged measuring 5.2 x 5.4 x 6.5 cm. Seminal vesicles are
unremarkable in appearance.

Other: No significant volume of ascites.  No pneumoperitoneum.

Musculoskeletal: There are no aggressive appearing lytic or blastic
lesions noted in the visualized portions of the skeleton.

VASCULAR MEASUREMENTS PERTINENT TO TAVR:

AORTA:

Minimal Aortic 4iameter-ZS x 19 mm

Severity of Aortic Calcification-moderate to severe

RIGHT PELVIS:

Right Common Iliac Artery -

Minimal Wiameter-AX.S x 12.0 mm

Tortuosity-mild-to-moderate

Calcification-mild

Right External Iliac Artery -

Minimal Viameter-0.Q x 8.9 mm

Tortuosity-severe

Calcification-none

Right Common Femoral Artery -

Minimal Giameter-M8.1 x 9.3 mm

Tortuosity-mild

Calcification-mild

LEFT PELVIS:

Left Common Iliac Artery -

Minimal Piameter-4U.4 x 12.7 mm

Tortuosity-mild

Calcification-mild

Left External Iliac Artery -

Minimal Niameter-G.I x 9.3 mm

Tortuosity-severe

Calcification-minimal

Left Common Femoral Artery -

Minimal Miameter-CO.L x 10.1 mm

Tortuosity-mild

Calcification-mild

Review of the MIP images confirms the above findings.
IMPRESSION: 1. Vascular findings and measurements pertinent to potential TAVR
procedure, as detailed above.
2. Severe thickening and calcification of the aortic valve,
compatible with reported clinical history of severe aortic stenosis.
3. Multiple tiny 2-4 mm pulmonary nodules scattered throughout the
lungs bilaterally, stable compared to recent prior study from
01/18/2020, nonspecific, but statistically likely benign. No
follow-up needed if patient is low-risk (and has no known or
suspected primary neoplasm). Non-contrast chest CT can be considered
in 12 months if patient is high-risk. This recommendation follows
the consensus statement: Guidelines for Management of Incidental
Pulmonary Nodules Detected on CT Images: From the [HOSPITAL]
4. Multiple nonobstructive calculi in the collecting system of the
left kidney measuring up to 7 mm. Calculus or cluster of small
calculi in the dependent portion of the urinary bladder measuring 11
mm. No ureteral stones or findings of urinary tract obstruction are
noted at this time.
5. Prostate gland is mildly enlarged and heterogeneous in
appearance. Correlation with PSA levels is recommended.
6. Severe atrophy of the right kidney.
7. Additional incidental findings, as above.

## 2023-05-20 ENCOUNTER — Other Ambulatory Visit: Payer: Self-pay | Admitting: Interventional Cardiology

## 2023-06-06 ENCOUNTER — Encounter (HOSPITAL_COMMUNITY): Payer: Self-pay | Admitting: Cardiology

## 2023-06-06 ENCOUNTER — Ambulatory Visit (HOSPITAL_COMMUNITY)
Admission: RE | Admit: 2023-06-06 | Discharge: 2023-06-06 | Disposition: A | Discharge status: Home / Self Care | Payer: Medicare Other | Source: Ambulatory Visit | Attending: Cardiology | Admitting: Cardiology

## 2023-06-06 ENCOUNTER — Ambulatory Visit (HOSPITAL_BASED_OUTPATIENT_CLINIC_OR_DEPARTMENT_OTHER)
Admission: RE | Admit: 2023-06-06 | Discharge: 2023-06-06 | Disposition: A | Discharge status: Home / Self Care | Payer: Medicare Other | Source: Ambulatory Visit | Attending: Cardiology | Admitting: Cardiology

## 2023-06-06 VITALS — BP 110/78 | HR 63 | Wt 223.6 lb

## 2023-06-06 DIAGNOSIS — I509 Heart failure, unspecified: Secondary | ICD-10-CM | POA: Diagnosis not present

## 2023-06-06 DIAGNOSIS — E7800 Pure hypercholesterolemia, unspecified: Secondary | ICD-10-CM | POA: Diagnosis not present

## 2023-06-06 DIAGNOSIS — I110 Hypertensive heart disease with heart failure: Secondary | ICD-10-CM | POA: Diagnosis not present

## 2023-06-06 DIAGNOSIS — I5022 Chronic systolic (congestive) heart failure: Secondary | ICD-10-CM

## 2023-06-06 LAB — BASIC METABOLIC PANEL
Anion gap: 6 (ref 5–15)
BUN: 32 mg/dL — ABNORMAL HIGH (ref 8–23)
CO2: 25 mmol/L (ref 22–32)
Calcium: 9.8 mg/dL (ref 8.9–10.3)
Chloride: 109 mmol/L (ref 98–111)
Creatinine, Ser: 1.27 mg/dL — ABNORMAL HIGH (ref 0.61–1.24)
GFR, Estimated: 57 mL/min — ABNORMAL LOW (ref >60)
Glucose, Bld: 93 mg/dL (ref 70–99)
Potassium: 4.4 mmol/L (ref 3.5–5.1)
Sodium: 140 mmol/L (ref 135–145)

## 2023-06-06 LAB — CBC
HCT: 43.7 % (ref 39.0–52.0)
Hemoglobin: 14.7 g/dL (ref 13.0–17.0)
MCH: 33 pg (ref 26.0–34.0)
MCHC: 33.6 g/dL (ref 30.0–36.0)
MCV: 98.2 fL (ref 80.0–100.0)
Platelets: 167 10*3/uL (ref 150–400)
RBC: 4.45 MIL/uL (ref 4.22–5.81)
RDW: 13.2 % (ref 11.5–15.5)
WBC: 7.7 10*3/uL (ref 4.0–10.5)
nRBC: 0 % (ref 0.0–0.2)

## 2023-06-06 LAB — LIPID PANEL
Cholesterol: 140 mg/dL (ref 0–200)
HDL: 45 mg/dL (ref >40)
LDL Cholesterol: 66 mg/dL (ref 0–99)
Total CHOL/HDL Ratio: 3.1 {ratio}
Triglycerides: 144 mg/dL (ref <150)
VLDL: 29 mg/dL (ref 0–40)

## 2023-06-06 LAB — ECHOCARDIOGRAM COMPLETE
AR max vel: 1.71 cm2
AV Area VTI: 1.95 cm2
AV Area mean vel: 1.59 cm2
AV Mean grad: 10 mm[Hg]
AV Peak grad: 17.5 mm[Hg]
Ao pk vel: 2.09 m/s
Area-P 1/2: 2.39 cm2
Calc EF: 51.3 %
S' Lateral: 3.3 cm
Single Plane A2C EF: 54.2 %
Single Plane A4C EF: 50.1 %

## 2023-06-06 LAB — BRAIN NATRIURETIC PEPTIDE: B Natriuretic Peptide: 256.9 pg/mL — ABNORMAL HIGH (ref 0.0–100.0)

## 2023-06-06 NOTE — Patient Instructions (Signed)
Medication Changes:  None, continue current medications  Lab Work:  Labs done today, your results will be available in MyChart, we will contact you for abnormal readings.   Special Instructions // Education:  Do the following things EVERYDAY: Weigh yourself in the morning before breakfast. Write it down and keep it in a log. Take your medicines as prescribed Eat low salt foods--Limit salt (sodium) to 2000 mg per day.  Stay as active as you can everyday Limit all fluids for the day to less than 2 liters   Follow-Up in: 4 months   At the [facility], you and your health needs are our priority. We have a designated team specialized in the treatment of Heart Failure. This Care Team includes your primary Heart Failure Specialized Cardiologist (physician), Advanced Practice Providers (APPs- Physician Assistants and Nurse Practitioners), and Pharmacist who all work together to provide you with the care you need, when you need it.   You may see any of the following providers on your designated Care Team at your next follow up:  Dr. Arvilla Meres Dr. Marca Ancona Dr. Dorthula Nettles Dr. Theresia Bough Tonye Becket, NP Robbie Lis, [place] [facility] [place], [place] Brynda Peon, NP [place] Lee, NP Karle Plumber, PharmD   Please be sure to bring in all your medications bottles to every appointment.   Need to Contact us:  If you have any questions or concerns before your next appointment please send [place] a message through [place] or call our office at [phone].    TO LEAVE A MESSAGE FOR THE NURSE SELECT OPTION 2, PLEASE LEAVE A MESSAGE INCLUDING: YOUR NAME DATE OF BIRTH CALL BACK NUMBER REASON FOR CALL**this is important as we prioritize the call backs  YOU WILL RECEIVE A CALL BACK THE SAME DAY AS LONG AS YOU CALL BEFORE 4:00 PM

## 2023-06-06 NOTE — Progress Notes (Signed)
PCP: Derek Aspen, MD Cardiology: Dr. Eldridge Dace HF Cardiology: Dr. Shirlee Latch  82 y.o. with history of bicuspid aortic valve s/p bioprosthetic AVR, bradycardia with Medtronic PPM, paroxysmal atrial fibrillation/flutter, and chronic HF with mid range EF was referred by Dr. Eldridge Dace for evaluation for possible cardiac amyloidosis. Patient had bioprosthetic aortic valve replacement in 6/22.  Pre-op, cath showed minimal CAD in 5/22.  Post-op, he developed symptomatic bradycardia and had Medtronic PPM placed.  He is primarily v-paced now.  He has paroxysmal atrial fibrillation but is generally in NSR.  Initial echo in 8/22 post-AVR showed EF 55-60%.  However, echoes in 9/23 and 12/23 showed EF 40-45%, mild LVH, mild RV enlargement with low normal systolic function, bioprosthetic aortic valve with mean gradient 16 mmHg. He had a PYP scan in 2/24 that was grade 1 with H/CL ratio > 1.5 but patient has increased uptake in the sternum and the left lateral ribs which may have falsely elevated the H/CL ration based on sample volume location.  This study seems at most equivocal.  Patient does report a bad fall onto his left side with rib pain towards the end of 2023.  Bone survey was done that was normal in 2/24.    Cardiac MRI in 5/24 showed LV EF 33%, moderate LVh, septal hypokinesis and septal-lateral dyssynchrony, RV EF 46%, ECV 25%, no LGE.    Patient had upgrade of device to Medtronic CRT-P in 7/24.   Echo was done today and reviewed, EF 45-50%, moderate focal basal septal hypertrophy, normal RV, bioprosthetic aortic valve mean gradient 10 mmHg, IVC normal.   Patient returns for followup of CHF.  Energy level is improved since CRT upgrade.  No significant exertional dyspnea or chest pain.  He fatigues at times but generally feels good.  No orthopnea/PND.  No lightheadedness.   ECG (personally reviewed): A-BiV pacing  Labs (1/24): SPEP normal, TSH normal, pro-BNP 1544, K 4.5, creatinine 1.19 Labs  (2/24): Myeloma panel negative, urine immunofixation negative, K 4.3, creatinine 1.19, BNP 362 Labs (7/24): K 4.3, creatinine 1.25  Medtronic device interrogation: No AF/VT, >99% BiV pacing, stable thoracic impedance.   PMH: 1. Bicuspid aortic valve disorder: With severe AS, s/p bioprosthetic aortic valve replacement 6/22.  - 4.2 cm ascending aorta by CTA 8/23.  2. AKI in setting of IV contrast with CTA in 8/23, required transient HD.  3. Nephrolithiasis 4. HTN 5. Gout 6. BPPV 7. Paroxysmal atrial fibrillation/flutter.  8. Bradycardia: Post-op AVR, has Medtronic CRT-P device.   9. CAD: LHC in 5/22 with 50% stenosis in PLV branch, otherwise no significant disease.  10. Chronic HF with mid-range EF: Echo in 8/22 post-AVR with EF 55-60%.  - Echo (9/23): EF 40-45% - Echo (12/23): EF 40-45%, mild LVH, mild RV enlargement with low normal function, bioprosthetic aortic valve with mean gradient 16 mmHg.  - PYP scan (2/24): Grade 1, H/CL ratio > 1.5 but patient has increased uptake in the sternum and the left lateral ribs which may have falsely elevated the H/CL ration based on sample volume location.  This study seems at most equivocal. Bone survey done in 2/24 was normal.  - Cardiac MRI (5/24): LV EF 33%, moderate LVh, septal hypokinesis and septal-lateral dyssynchrony, RV EF 46%, ECV 25%, no LGE.   - Device upgraded to Medtronic CRT-P (7/24) - Echo (12/24): EF 45-50%, moderate focal basal septal hypertrophy, normal RV, bioprosthetic aortic valve mean gradient 10 mmHg, IVC normal.  11. Spinal stenosis  SH: Retired [employer], married, no  smoking, rare ETOH.   Family History  Problem Relation Age of Onset   Other Mother 57       HEAVY SMOKER   COPD Father 22       HEAVY SMOKER   Other Sister 104       HEAVY SMOKER   ROS: All systems reviewed and negative except as per HPI.   Current Outpatient Medications  Medication Sig Dispense Refill   acetaminophen (TYLENOL) 325 MG tablet Take 2  tablets (650 mg total) by mouth every 6 (six) hours as needed for mild pain (or Fever >/= 101).     apixaban (ELIQUIS) 5 MG TABS tablet TAKE 1 TABLET BY MOUTH TWICE  DAILY 200 tablet 1   atorvastatin (LIPITOR) 20 MG tablet Take 20 mg by mouth every evening.     carvedilol (COREG) 6.25 MG tablet Take 6.25 mg by mouth 2 (two) times daily with a meal. 1/2 tab am   1 tab in pm     cholecalciferol (VITAMIN D) 25 MCG (1000 UNIT) tablet Take 1,000 Units by mouth in the morning.     dapagliflozin propanediol (FARXIGA) 10 MG TABS tablet Take 1 tablet (10 mg total) by mouth daily before breakfast. 30 tablet 11   finasteride (PROSCAR) 5 MG tablet Take 5 mg by mouth in the morning.     furosemide (LASIX) 40 MG tablet Take 1 tablet (40 mg total) by mouth daily. 90 tablet 3   Multiple Vitamin (MULTIVITAMIN WITH MINERALS) TABS tablet Take 1 tablet by mouth in the morning.     Omega-3 Fatty Acids (FISH OIL) 1000 MG CAPS Take 1,000 mg by mouth 2 (two) times daily.     sacubitril-valsartan (ENTRESTO) 24-26 MG Take 1/2 tablet in the am and 1 tablet in the pm. 60 tablet 11   spironolactone (ALDACTONE) 25 MG tablet Take 1 tablet (25 mg total) by mouth at bedtime. 90 tablet 3   No current facility-administered medications for this encounter.   BP 110/78   Pulse 63   Wt 101.4 kg (223 lb 9.6 oz)   SpO2 97%   BMI 30.33 kg/m  General: NAD Neck: No JVD, no thyromegaly or thyroid nodule.  Lungs: Clear to auscultation bilaterally with normal respiratory effort. CV: Nondisplaced PMI.  Heart regular S1/S2, no S3/S4, no murmur.  No peripheral edema.  No carotid bruit.  Normal pedal pulses.  Abdomen: Soft, nontender, no hepatosplenomegaly, no distention.  Skin: Intact without lesions or rashes.  Neurologic: Alert and oriented x 3.  Psych: Normal affect. Extremities: No clubbing or cyanosis.  HEENT: Normal.   Assessment/Plan: 1. Chronic HF with mid range EF:  Echo in 9/23 and again in 12/23 showed EF 40-45%, mild  LVH, mild RV enlargement with low normal function, bioprosthetic aortic valve with mean gradient 16 mmHg.  This is down from EF 55-60% on 8/22 echo.  Pre-AVR cath in 5/22 showed only mild CAD and he has had no chest pain, think ischemic CMP is unlikely.  Cardiac amyloidosis has been raised as a concern.  PYP scan in 2/24 was abnormal, but I think this study is at most equivocal. It was read as grade 1, H/CL ratio > 1.5 but patient has increased uptake in the sternum and the left lateral ribs which may have falsely elevated the H/CL ration based on sample volume location (bone survey done later in 2/24 was normal).  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  Cardiac MRI in 5/24 was not suggestive of cardiac amyloidosis, showing LV EF 33%, moderate LVh, septal hypokinesis and septal-lateral dyssynchrony, RV EF 46%, ECV 25%, no LGE.  He had a device upgrade to MDT CRT-P in 7/24 and is BiV pacing appropriately today. Echo today showed  EF 45-50%, moderate focal basal septal hypertrophy, normal RV, bioprosthetic aortic valve mean gradient 10 mmHg, IVC normal.  He feels better since device upgrade, NYHA class II.  He is not volume overloaded on exam or by Optivol.    - Continue Lasix 40 mg daily.   - Continue Coreg 6.25 mg bid.  - Continue Entresto 24/26 1/2 tab in am and full tab in pm (decreased with orthostatic symptoms, now resolved).  - Continue Farxiga 10 mg daily.  - Continue spironolactone 25 mg daily.  BMET/BNP today.  2. Bioprosthetic aortic valve: This was stable on today's echo.  3. HTN: BP controlled.  4. Atrial fibrillation: Paroxysmal. NSR today.   - Continue apixaban, CBC today.  5. CAD: Mild on 5/22 cath.  - Continue atorvastatin, lipids today.   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  8/23 CTA chest 4.2 cm.  Patient had AKI with CT contrast, would avoid in future (could do MRA chest).  - I will  arrange for MRA chest at next followup.   Followup 4 months with APP.    Derek Washington 06/06/2023

## 2023-06-17 ENCOUNTER — Telehealth (HOSPITAL_COMMUNITY): Payer: Self-pay | Admitting: Cardiology

## 2023-06-17 MED ORDER — APIXABAN 5 MG PO TABS: 5.0000 mg | ORAL_TABLET | Freq: Two times a day (BID) | ORAL | 3 refills | Status: DC

## 2023-06-17 MED ORDER — CARVEDILOL 6.25 MG PO TABS: 6.2500 mg | ORAL_TABLET | Freq: Two times a day (BID) | ORAL | 11 refills | Status: DC

## 2023-06-17 NOTE — Telephone Encounter (Signed)
Refills sent

## 2023-06-18 DIAGNOSIS — H35372 Puckering of macula, left eye: Secondary | ICD-10-CM | POA: Diagnosis not present

## 2023-06-18 DIAGNOSIS — H348322 Tributary (branch) retinal vein occlusion, left eye, stable: Secondary | ICD-10-CM | POA: Diagnosis not present

## 2023-06-18 DIAGNOSIS — H3562 Retinal hemorrhage, left eye: Secondary | ICD-10-CM | POA: Diagnosis not present

## 2023-06-18 DIAGNOSIS — H43822 Vitreomacular adhesion, left eye: Secondary | ICD-10-CM | POA: Diagnosis not present

## 2023-06-18 DIAGNOSIS — H34831 Tributary (branch) retinal vein occlusion, right eye, with macular edema: Secondary | ICD-10-CM | POA: Diagnosis not present

## 2023-06-18 DIAGNOSIS — H31091 Other chorioretinal scars, right eye: Secondary | ICD-10-CM | POA: Diagnosis not present

## 2023-06-20 ENCOUNTER — Encounter: Payer: Self-pay | Admitting: Internal Medicine

## 2023-06-25 ENCOUNTER — Ambulatory Visit (INDEPENDENT_AMBULATORY_CARE_PROVIDER_SITE_OTHER): Payer: Medicare Other

## 2023-06-25 DIAGNOSIS — I442 Atrioventricular block, complete: Secondary | ICD-10-CM | POA: Diagnosis not present

## 2023-06-26 LAB — CUP PACEART REMOTE DEVICE CHECK
Battery Remaining Longevity: 84 mo
Battery Voltage: 3.02 V
Brady Statistic AP VP Percent: 84.67 %
Brady Statistic AP VS Percent: 0.01 %
Brady Statistic AS VP Percent: 15.27 %
Brady Statistic AS VS Percent: 0.05 %
Brady Statistic RA Percent Paced: 84.66 %
Brady Statistic RV Percent Paced: 99.94 %
Date Time Interrogation Session: 20250106203049
Implantable Lead Connection Status: 753985
Implantable Lead Connection Status: 753985
Implantable Lead Connection Status: 753985
Implantable Lead Implant Date: 20220622
Implantable Lead Implant Date: 20220622
Implantable Lead Implant Date: 20240703
Implantable Lead Location: 753858
Implantable Lead Location: 753859
Implantable Lead Location: 753860
Implantable Lead Model: 3830
Implantable Lead Model: 5076
Implantable Pulse Generator Implant Date: 20240703
Lead Channel Impedance Value: 1026 Ohm
Lead Channel Impedance Value: 1425 Ohm
Lead Channel Impedance Value: 1596 Ohm
Lead Channel Impedance Value: 1634 Ohm
Lead Channel Impedance Value: 1653 Ohm
Lead Channel Impedance Value: 1672 Ohm
Lead Channel Impedance Value: 1767 Ohm
Lead Channel Impedance Value: 228 Ohm
Lead Channel Impedance Value: 323 Ohm
Lead Channel Impedance Value: 456 Ohm
Lead Channel Impedance Value: 570 Ohm
Lead Channel Impedance Value: 741 Ohm
Lead Channel Impedance Value: 817 Ohm
Lead Channel Impedance Value: 969 Ohm
Lead Channel Pacing Threshold Amplitude: 0.625 V
Lead Channel Pacing Threshold Amplitude: 1 V
Lead Channel Pacing Threshold Pulse Width: 0.4 ms
Lead Channel Pacing Threshold Pulse Width: 0.4 ms
Lead Channel Sensing Intrinsic Amplitude: 1.5 mV
Lead Channel Sensing Intrinsic Amplitude: 1.5 mV
Lead Channel Setting Pacing Amplitude: 2 V
Lead Channel Setting Pacing Amplitude: 2.5 V
Lead Channel Setting Pacing Amplitude: 2.5 V
Lead Channel Setting Pacing Pulse Width: 0.4 ms
Lead Channel Setting Pacing Pulse Width: 1 ms
Lead Channel Setting Sensing Sensitivity: 4 mV
Zone Setting Status: 755011

## 2023-07-17 ENCOUNTER — Encounter: Payer: Self-pay | Admitting: Internal Medicine

## 2023-07-25 IMAGING — DX DG CHEST 2V
2 series · 2 of 2 positions shown · non-contrast
Comparison: 12/08/2020

CLINICAL DATA: Post aortic valve replacement

EXAM:
CHEST - 2 VIEW

[dg chest 2 view (1 of 2)]
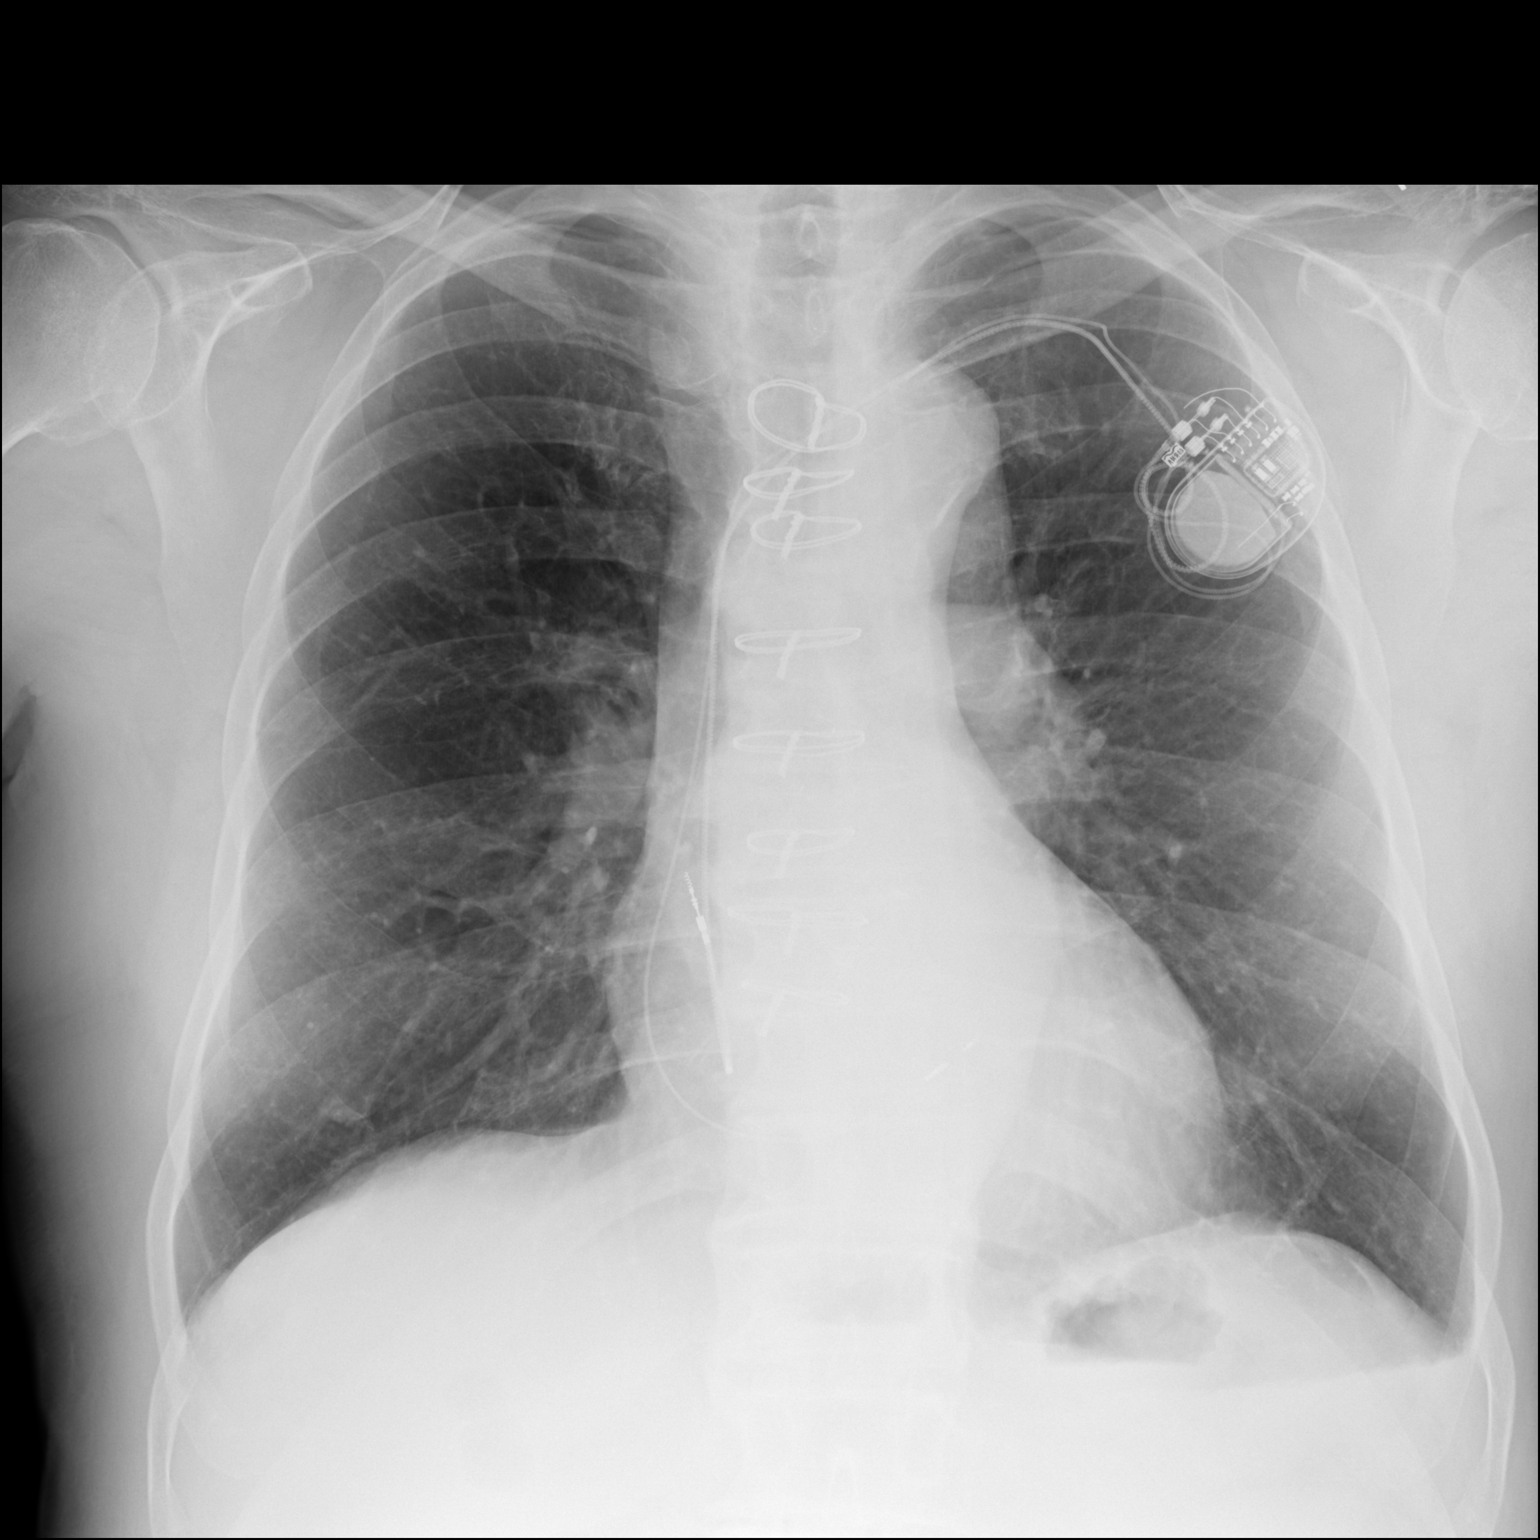

[dg chest 2 view (2 of 2)]
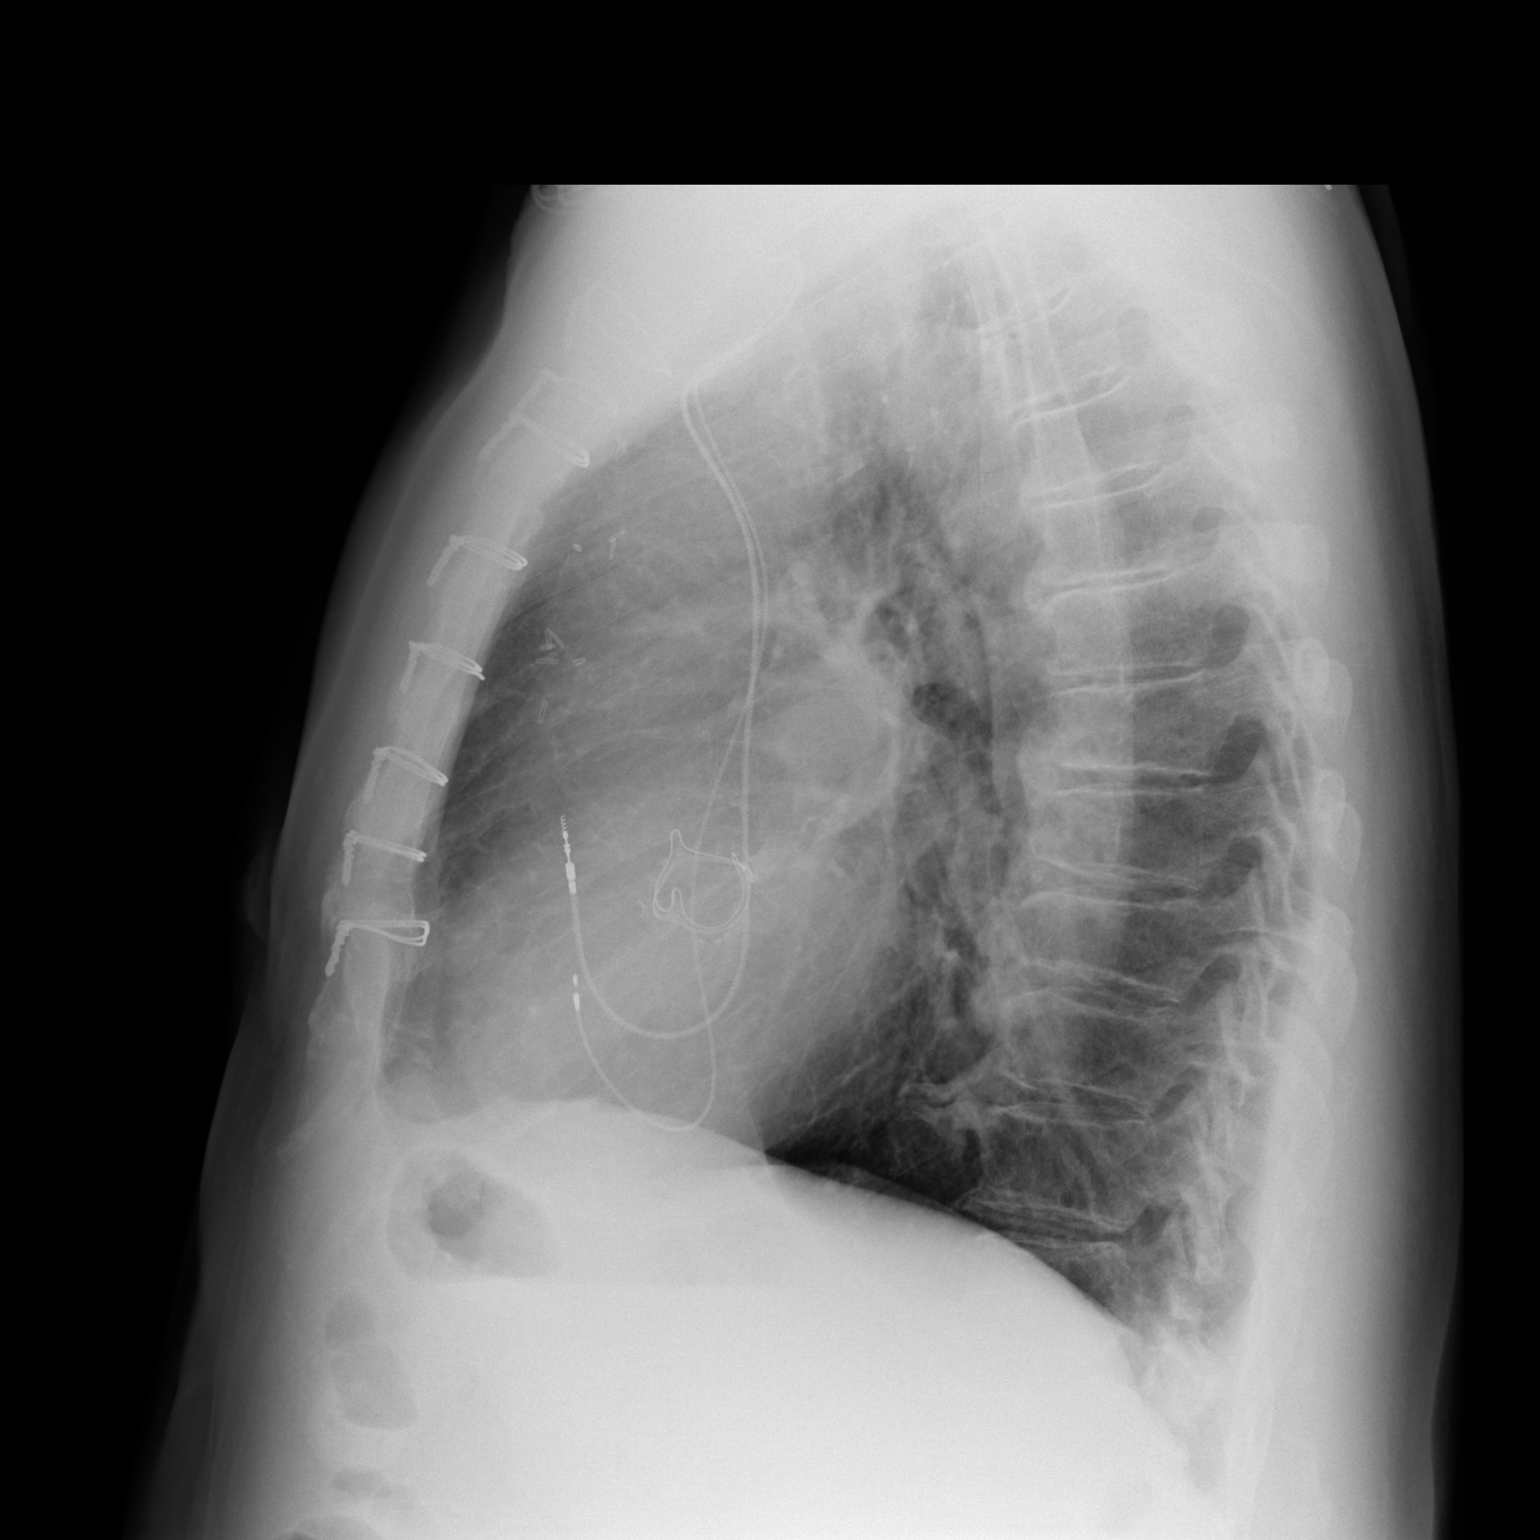

[2 of 2 positions shown; findings below may reference images not displayed]

FINDINGS: Changes of median sternotomy. Left pacer remains in place,
unchanged. Heart is normal size. Scattered aortic atherosclerosis.
No confluent airspace opacities or effusions. No acute bony
abnormality.
IMPRESSION: No active cardiopulmonary disease.

## 2023-08-05 DIAGNOSIS — H35363 Drusen (degenerative) of macula, bilateral: Secondary | ICD-10-CM | POA: Diagnosis not present

## 2023-08-07 NOTE — Progress Notes (Signed)
 Remote pacemaker transmission.

## 2023-08-07 NOTE — Addendum Note (Signed)
 Addended by: Geralyn Flash D on: 08/07/2023 11:46 AM   Modules accepted: Orders

## 2023-08-11 ENCOUNTER — Other Ambulatory Visit: Payer: Self-pay | Admitting: Interventional Cardiology

## 2023-08-11 DIAGNOSIS — I5022 Chronic systolic (congestive) heart failure: Secondary | ICD-10-CM

## 2023-08-28 ENCOUNTER — Other Ambulatory Visit (HOSPITAL_COMMUNITY): Payer: Self-pay | Admitting: Pharmacist

## 2023-08-28 MED ORDER — ENTRESTO 24-26 MG PO TABS: ORAL_TABLET | ORAL | 3 refills | Status: DC

## 2023-09-02 ENCOUNTER — Other Ambulatory Visit (HOSPITAL_COMMUNITY): Payer: Self-pay | Admitting: Cardiology

## 2023-09-02 MED ORDER — ENTRESTO 24-26 MG PO TABS: ORAL_TABLET | ORAL | 3 refills | Status: DC

## 2023-09-02 MED ORDER — APIXABAN 5 MG PO TABS: 5.0000 mg | ORAL_TABLET | Freq: Two times a day (BID) | ORAL | 3 refills | Status: DC

## 2023-09-02 NOTE — Addendum Note (Signed)
 Addended by: Theresia Bough on: 09/02/2023 01:12 PM   Modules accepted: Orders

## 2023-09-03 MED ORDER — ENTRESTO 24-26 MG PO TABS: ORAL_TABLET | ORAL | 3 refills | Status: DC

## 2023-09-03 NOTE — Addendum Note (Signed)
 Addended by: Theresia Bough on: 09/03/2023 10:39 AM   Modules accepted: Orders

## 2023-09-24 ENCOUNTER — Ambulatory Visit (INDEPENDENT_AMBULATORY_CARE_PROVIDER_SITE_OTHER): Payer: Medicare Other

## 2023-09-24 DIAGNOSIS — I442 Atrioventricular block, complete: Secondary | ICD-10-CM | POA: Diagnosis not present

## 2023-09-25 LAB — CUP PACEART REMOTE DEVICE CHECK
Battery Remaining Longevity: 80 mo
Battery Voltage: 3 V
Brady Statistic AP VP Percent: 82.38 %
Brady Statistic AP VS Percent: 0.01 %
Brady Statistic AS VP Percent: 17.34 %
Brady Statistic AS VS Percent: 0.27 %
Brady Statistic RA Percent Paced: 82.51 %
Brady Statistic RV Percent Paced: 99.72 %
Date Time Interrogation Session: 20250407200731
Implantable Lead Connection Status: 753985
Implantable Lead Connection Status: 753985
Implantable Lead Connection Status: 753985
Implantable Lead Implant Date: 20220622
Implantable Lead Implant Date: 20220622
Implantable Lead Implant Date: 20240703
Implantable Lead Location: 753858
Implantable Lead Location: 753859
Implantable Lead Location: 753860
Implantable Lead Model: 3830
Implantable Lead Model: 5076
Implantable Pulse Generator Implant Date: 20240703
Lead Channel Impedance Value: 1007 Ohm
Lead Channel Impedance Value: 1463 Ohm
Lead Channel Impedance Value: 1577 Ohm
Lead Channel Impedance Value: 1653 Ohm
Lead Channel Impedance Value: 1653 Ohm
Lead Channel Impedance Value: 1672 Ohm
Lead Channel Impedance Value: 1767 Ohm
Lead Channel Impedance Value: 247 Ohm
Lead Channel Impedance Value: 304 Ohm
Lead Channel Impedance Value: 456 Ohm
Lead Channel Impedance Value: 570 Ohm
Lead Channel Impedance Value: 798 Ohm
Lead Channel Impedance Value: 817 Ohm
Lead Channel Impedance Value: 988 Ohm
Lead Channel Pacing Threshold Amplitude: 0.75 V
Lead Channel Pacing Threshold Amplitude: 1 V
Lead Channel Pacing Threshold Pulse Width: 0.4 ms
Lead Channel Pacing Threshold Pulse Width: 0.4 ms
Lead Channel Sensing Intrinsic Amplitude: 1.5 mV
Lead Channel Sensing Intrinsic Amplitude: 1.5 mV
Lead Channel Setting Pacing Amplitude: 2 V
Lead Channel Setting Pacing Amplitude: 2.5 V
Lead Channel Setting Pacing Amplitude: 2.5 V
Lead Channel Setting Pacing Pulse Width: 0.4 ms
Lead Channel Setting Pacing Pulse Width: 1 ms
Lead Channel Setting Sensing Sensitivity: 4 mV
Zone Setting Status: 755011

## 2023-10-03 ENCOUNTER — Telehealth (HOSPITAL_COMMUNITY): Payer: Self-pay

## 2023-10-03 NOTE — Telephone Encounter (Signed)
 Called to confirm/remind patient of their appointment at the [facility] on 10/04/23***.   Appointment:   [x] Confirmed  [] Left mess   [] No answer/No voice mail  [] Phone not in service  Patient reminded to bring all medications and/or complete list.  Confirmed patient has transportation. Gave directions, instructed to utilize valet parking.

## 2023-10-04 ENCOUNTER — Encounter (HOSPITAL_COMMUNITY): Payer: Self-pay

## 2023-10-04 ENCOUNTER — Ambulatory Visit (HOSPITAL_COMMUNITY)
Admission: RE | Admit: 2023-10-04 | Discharge: 2023-10-04 | Disposition: A | Discharge status: Home / Self Care | Payer: Medicare Other | Source: Ambulatory Visit | Attending: Cardiology | Admitting: Cardiology

## 2023-10-04 VITALS — BP 100/60 | HR 63 | Wt 225.0 lb

## 2023-10-04 DIAGNOSIS — Z79899 Other long term (current) drug therapy: Secondary | ICD-10-CM | POA: Insufficient documentation

## 2023-10-04 DIAGNOSIS — Z7901 Long term (current) use of anticoagulants: Secondary | ICD-10-CM | POA: Diagnosis not present

## 2023-10-04 DIAGNOSIS — Z7984 Long term (current) use of oral hypoglycemic drugs: Secondary | ICD-10-CM | POA: Insufficient documentation

## 2023-10-04 DIAGNOSIS — I110 Hypertensive heart disease with heart failure: Secondary | ICD-10-CM | POA: Insufficient documentation

## 2023-10-04 DIAGNOSIS — Z950 Presence of cardiac pacemaker: Secondary | ICD-10-CM | POA: Insufficient documentation

## 2023-10-04 DIAGNOSIS — I5022 Chronic systolic (congestive) heart failure: Secondary | ICD-10-CM | POA: Insufficient documentation

## 2023-10-04 DIAGNOSIS — I2510 Atherosclerotic heart disease of native coronary artery without angina pectoris: Secondary | ICD-10-CM | POA: Diagnosis not present

## 2023-10-04 DIAGNOSIS — I7121 Aneurysm of the ascending aorta, without rupture: Secondary | ICD-10-CM | POA: Insufficient documentation

## 2023-10-04 DIAGNOSIS — Z953 Presence of xenogenic heart valve: Secondary | ICD-10-CM | POA: Diagnosis not present

## 2023-10-04 DIAGNOSIS — I480 Paroxysmal atrial fibrillation: Secondary | ICD-10-CM | POA: Insufficient documentation

## 2023-10-04 LAB — BASIC METABOLIC PANEL WITH GFR
Anion gap: 9 (ref 5–15)
BUN: 36 mg/dL — ABNORMAL HIGH (ref 8–23)
CO2: 24 mmol/L (ref 22–32)
Calcium: 9.7 mg/dL (ref 8.9–10.3)
Chloride: 105 mmol/L (ref 98–111)
Creatinine, Ser: 1.46 mg/dL — ABNORMAL HIGH (ref 0.61–1.24)
GFR, Estimated: 48 mL/min — ABNORMAL LOW (ref >60)
Glucose, Bld: 106 mg/dL — ABNORMAL HIGH (ref 70–99)
Potassium: 4.7 mmol/L (ref 3.5–5.1)
Sodium: 138 mmol/L (ref 135–145)

## 2023-10-04 LAB — CBC
HCT: 42.1 % (ref 39.0–52.0)
Hemoglobin: 14.1 g/dL (ref 13.0–17.0)
MCH: 33.3 pg (ref 26.0–34.0)
MCHC: 33.5 g/dL (ref 30.0–36.0)
MCV: 99.3 fL (ref 80.0–100.0)
Platelets: 166 10*3/uL (ref 150–400)
RBC: 4.24 MIL/uL (ref 4.22–5.81)
RDW: 13.4 % (ref 11.5–15.5)
WBC: 6.9 10*3/uL (ref 4.0–10.5)
nRBC: 0 % (ref 0.0–0.2)

## 2023-10-04 NOTE — Progress Notes (Signed)
 [facility] Progress Note   PCP: Benedetta Bradley, MD Cardiology: Dr. Jacquelynn Matter HF Cardiology: Dr. Mitzie Anda  Reason for Visit: F/u for chronic systolic heart failure, HFmrEF   83 y.o. with history of bicuspid aortic valve s/p bioprosthetic AVR, bradycardia with Medtronic PPM, paroxysmal atrial fibrillation/flutter, and chronic HF with mid range EF was referred by Dr. Jacquelynn Matter for evaluation for possible cardiac amyloidosis. Patient had bioprosthetic aortic valve replacement in 6/22.  Pre-op, cath showed minimal CAD in 5/22.  Post-op, he developed symptomatic bradycardia and had Medtronic PPM placed.  He is primarily v-paced now.  He has paroxysmal atrial fibrillation but is generally in NSR.  Initial echo in 8/22 post-AVR showed EF 55-60%.  However, echoes in 9/23 and 12/23 showed EF 40-45%, mild LVH, mild RV enlargement with low normal systolic function, bioprosthetic aortic valve with mean gradient 16 mmHg. He had a PYP scan in 2/24 that was grade 1 with H/CL ratio > 1.5 but patient has increased uptake in the sternum and the left lateral ribs which may have falsely elevated the H/CL ration based on sample volume location.  This study seems at most equivocal.  Patient does report a bad fall onto his left side with rib pain towards the end of 2023.  Bone survey was done that was normal in 2/24.    Cardiac MRI in 5/24 showed LV EF 33%, moderate LVh, septal hypokinesis and septal-lateral dyssynchrony, RV EF 46%, ECV 25%, no LGE.    Patient had upgrade of device to Medtronic CRT-P in 7/24.   Echo 12/24 EF 45-50%, moderate focal basal septal hypertrophy, normal RV, bioprosthetic aortic valve mean gradient 10 mmHg, IVC normal.   He returns today for routine f/u. Here w/ his wife. Doing fairly well. No exertional dyspnea w/ ADLs. Walks 1.2 miles a day, the last portion up a steep hill. Tolerates ok w/o exertional dyspnea. No CP. Wt stable. Thoracic impedence stable on device  interrogation. BP 110/60. Denies orthostatic symptoms. Compliant w/ meds. No side effects.    ECG (personally reviewed): B-V paced 68 bpm   Labs (1/24): SPEP normal, TSH normal, pro-BNP 1544, K 4.5, creatinine 1.19 Labs (2/24): Myeloma panel negative, urine immunofixation negative, K 4.3, creatinine 1.19, BNP 362 Labs (7/24): K 4.3, creatinine 1.25 Labs (12/24): K 4.4, creatinine 1.27   Medtronic device interrogation: 99% effective Bi-V pacing, no AT/AF  PMH: 1. Bicuspid aortic valve disorder: With severe AS, s/p bioprosthetic aortic valve replacement 6/22.  - 4.2 cm ascending aorta by CTA 8/23.  2. AKI in setting of IV contrast with CTA in 8/23, required transient HD.  3. Nephrolithiasis 4. HTN 5. Gout 6. BPPV 7. Paroxysmal atrial fibrillation/flutter.  8. Bradycardia: Post-op AVR, has Medtronic CRT-P device.   9. CAD: LHC in 5/22 with 50% stenosis in PLV branch, otherwise no significant disease.  10. Chronic HF with mid-range EF: Echo in 8/22 post-AVR with EF 55-60%.  - Echo (9/23): EF 40-45% - Echo (12/23): EF 40-45%, mild LVH, mild RV enlargement with low normal function, bioprosthetic aortic valve with mean gradient 16 mmHg.  - PYP scan (2/24): Grade 1, H/CL ratio > 1.5 but patient has increased uptake in the sternum and the left lateral ribs which may have falsely elevated the H/CL ration based on sample volume location.  This study seems at most equivocal. Bone survey done in 2/24 was normal.  - Cardiac MRI (5/24): LV EF 33%, moderate LVh, septal hypokinesis and septal-lateral dyssynchrony, RV EF 46%, ECV 25%,  no LGE.   - Device upgraded to Medtronic CRT-P (7/24) - Echo (12/24): EF 45-50%, moderate focal basal septal hypertrophy, normal RV, bioprosthetic aortic valve mean gradient 10 mmHg, IVC normal.  11. Spinal stenosis  SH: Retired [employer], married, no smoking, rare ETOH.   Family History  Problem Relation Age of Onset   Other Mother 12       HEAVY SMOKER   COPD  Father 81       HEAVY SMOKER   Other Sister 65       HEAVY SMOKER   ROS: All systems reviewed and negative except as per HPI.   Current Outpatient Medications  Medication Sig Dispense Refill   acetaminophen  (TYLENOL ) 325 MG tablet Take 2 tablets (650 mg total) by mouth every 6 (six) hours as needed for mild pain (or Fever >/= 101).     apixaban  (ELIQUIS ) 5 MG TABS tablet Take 1 tablet (5 mg total) by mouth 2 (two) times daily. 180 tablet 3   atorvastatin  (LIPITOR) 20 MG tablet Take 20 mg by mouth every evening.     carvedilol  (COREG ) 6.25 MG tablet Take 3.125 mg by mouth in the morning. 1 tablet in the PM     cholecalciferol  (VITAMIN D ) 25 MCG (1000 UNIT) tablet Take 1,000 Units by mouth in the morning.     dapagliflozin  propanediol (FARXIGA ) 10 MG TABS tablet Take 1 tablet (10 mg total) by mouth daily before breakfast. 30 tablet 11   finasteride  (PROSCAR ) 5 MG tablet Take 5 mg by mouth in the morning.     furosemide  (LASIX ) 40 MG tablet Take 1 tablet (40 mg total) by mouth daily. 90 tablet 3   Multiple Vitamin (MULTIVITAMIN WITH MINERALS) TABS tablet Take 1 tablet by mouth in the morning.     Omega-3 Fatty Acids (FISH OIL) 1000 MG CAPS Take 1,000 mg by mouth 2 (two) times daily.     sacubitril-valsartan (ENTRESTO ) 24-26 MG Take 1/2 tablet in the am and 1 tablet in the pm. 180 tablet 3   spironolactone  (ALDACTONE ) 25 MG tablet Take 1 tablet (25 mg total) by mouth at bedtime. 90 tablet 3   No current facility-administered medications for this encounter.   BP 100/60   Pulse 63   Wt 102.1 kg (225 lb)   SpO2 97%   BMI 30.52 kg/m  PHYSICAL EXAM: General:  Well appearing. No respiratory difficulty HEENT: normal Neck: supple. no JVD. Carotids 2+ bilat; no bruits. No lymphadenopathy or thyromegaly appreciated. Cor: PMI nondisplaced. Regular rate & rhythm. No rubs, gallops or murmurs. Lungs: clear Abdomen: soft, nontender, nondistended. No hepatosplenomegaly. No bruits or masses. Good  bowel sounds. Extremities: no cyanosis, clubbing, rash, edema Neuro: alert & oriented x 3, cranial nerves grossly intact. moves all 4 extremities w/o difficulty. Affect pleasant.  Assessment/Plan: 1. Chronic HF with mid range EF:  Echo in 9/23 and again in 12/23 showed EF 40-45%, mild LVH, mild RV enlargement with low normal function, bioprosthetic aortic valve with mean gradient 16 mmHg.  This is down from EF 55-60% on 8/22 echo.  Pre-AVR cath in 5/22 showed only mild CAD and he has had no chest pain, think ischemic CMP is unlikely.  Cardiac amyloidosis has been raised as a concern.  PYP scan in 2/24 was abnormal, but I think this study is at most equivocal. It was read as grade 1, H/CL ratio > 1.5 but patient has increased uptake in the sternum and the left lateral ribs which may have falsely  elevated the H/CL ration based on sample volume location (bone survey done later in 2/24 was normal).  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  Cardiac MRI in 5/24 was not suggestive of cardiac amyloidosis, showing LV EF 33%, moderate LVh, septal hypokinesis and septal-lateral dyssynchrony, RV EF 46%, ECV 25%, no LGE.  He had a device upgrade to MDT CRT-P in 7/24 and is BiV pacing appropriately today.  Most recent echo 12/24 showed EF 45-50%, moderate focal basal septal hypertrophy, normal RV, bioprosthetic aortic valve mean gradient 10 mmHg, IVC normal.   - Stable NYHA Class I-II. Euvolemic on exam and device interrogation. 99% B-V paced. Soft BP limits med titration   - Continue Lasix  40 mg daily.   - Continue Coreg  6.25 mg bid.  - Continue Entresto  24/26 1/2 tab in am and full tab in pm (had dizziness w/ higher doses).  - Continue Farxiga  10 mg daily. Denies GU symptoms  - Continue spironolactone  25 mg daily.   - Check BMP today   2. Bioprosthetic aortic valve: stable on echo 12/24, mean gradient 10 mmHg   3. HTN: controlled on current regimen,  100/60. No orthostatic symptoms   - HF GDMT per above. No dose increases today  - check BMP   4. Atrial fibrillation: Paroxysmal. Per device interrogation, he had a run of AF/AT mid March but no recurrence since. He was asymptomatic. EKG today shows Bi-v paced rhythm. HR 60s   - on anticoagulation w/ Eliquis . Denies bleeding. Check CBC today   5. CAD: Mild on 5/22 cath.  - stable w/o CP  - Continue Atorvastatin  40 mg. Lipids well controlled and LDL at goal at 66 mg/dL (69/62). 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  8/23 CTA chest 4.2 cm.  Patient had AKI with CT contrast, would avoid in future (could do MRA chest).  - Plan MRA chest next visit   F/u w/ Dr. Mitzie Anda in 4-6 months   Ruddy Corral, PA-C 10/04/2023

## 2023-10-04 NOTE — Patient Instructions (Signed)
 There has been no changes to your medications.  Labs done today, your results will be available in MyChart, we will contact you for abnormal readings.  Your physician recommends that you schedule a follow-up appointment in: 6 months ( October) ** PLEASE CALL THE OFFICE IN JULY TO ARRANGE YOUR FOLLOW UP APPOINTMENT.**  If you have any questions or concerns before your next appointment please send us  a message through [place] or call our office at [phone].    TO LEAVE A MESSAGE FOR THE NURSE SELECT OPTION 2, PLEASE LEAVE A MESSAGE INCLUDING: YOUR NAME DATE OF BIRTH CALL BACK NUMBER REASON FOR CALL**this is important as we prioritize the call backs  YOU WILL RECEIVE A CALL BACK THE SAME DAY AS LONG AS YOU CALL BEFORE 4:00 PM  At the [facility], you and your health needs are our priority. As part of our continuing mission to provide you with exceptional heart care, we have created designated Provider Care Teams. These Care Teams include your primary Cardiologist (physician) and Advanced Practice Providers (APPs- Physician Assistants and Nurse Practitioners) who all work together to provide you with the care you need, when you need it.   You may see any of the following providers on your designated Care Team at your next follow up: Dr Jules Oar Dr Peder Bourdon Dr. Alwin Baars Dr. Arta Lark Amy Marijane Shoulders, NP Ruddy Corral, [place] [facility] [place], [place] Dennise Fitz, NP [place] Lee, NP Shawnee Dellen, NP Luster Salters, PharmD Bevely Brush, PharmD   Please be sure to bring in all your medications bottles to every appointment.    Thank you for choosing [facility]

## 2023-10-05 ENCOUNTER — Encounter: Payer: Self-pay | Admitting: Internal Medicine

## 2023-10-08 DIAGNOSIS — H31091 Other chorioretinal scars, right eye: Secondary | ICD-10-CM | POA: Diagnosis not present

## 2023-10-08 DIAGNOSIS — H34831 Tributary (branch) retinal vein occlusion, right eye, with macular edema: Secondary | ICD-10-CM | POA: Diagnosis not present

## 2023-10-08 DIAGNOSIS — H43822 Vitreomacular adhesion, left eye: Secondary | ICD-10-CM | POA: Diagnosis not present

## 2023-10-08 DIAGNOSIS — H348322 Tributary (branch) retinal vein occlusion, left eye, stable: Secondary | ICD-10-CM | POA: Diagnosis not present

## 2023-10-08 DIAGNOSIS — H35372 Puckering of macula, left eye: Secondary | ICD-10-CM | POA: Diagnosis not present

## 2023-10-08 DIAGNOSIS — H3562 Retinal hemorrhage, left eye: Secondary | ICD-10-CM | POA: Diagnosis not present

## 2023-10-16 ENCOUNTER — Encounter: Payer: Self-pay | Admitting: Cardiology

## 2023-10-22 ENCOUNTER — Other Ambulatory Visit (HOSPITAL_COMMUNITY): Payer: Self-pay | Admitting: Cardiology

## 2023-10-22 MED ORDER — DAPAGLIFLOZIN PROPANEDIOL 10 MG PO TABS: 10.0000 mg | ORAL_TABLET | Freq: Every day | ORAL | 11 refills | Status: DC

## 2023-10-23 DIAGNOSIS — N200 Calculus of kidney: Secondary | ICD-10-CM | POA: Diagnosis not present

## 2023-10-23 DIAGNOSIS — R3912 Poor urinary stream: Secondary | ICD-10-CM | POA: Diagnosis not present

## 2023-10-25 ENCOUNTER — Other Ambulatory Visit (HOSPITAL_COMMUNITY): Payer: Self-pay

## 2023-10-25 DIAGNOSIS — I5022 Chronic systolic (congestive) heart failure: Secondary | ICD-10-CM

## 2023-10-25 MED ORDER — DAPAGLIFLOZIN PROPANEDIOL 10 MG PO TABS: 10.0000 mg | ORAL_TABLET | Freq: Every day | ORAL | 11 refills | Status: DC

## 2023-11-12 NOTE — Addendum Note (Signed)
 Addended by: Lott Rouleau A on: 11/12/2023 10:59 AM   Modules accepted: Orders

## 2023-11-12 NOTE — Progress Notes (Signed)
 Remote pacemaker transmission.

## 2023-12-24 ENCOUNTER — Ambulatory Visit: Payer: Medicare Other

## 2023-12-26 LAB — CUP PACEART REMOTE DEVICE CHECK
Battery Remaining Longevity: 75 mo
Battery Voltage: 2.99 V
Brady Statistic AP VP Percent: 82.86 %
Brady Statistic AP VS Percent: 0.23 %
Brady Statistic AS VP Percent: 10.95 %
Brady Statistic AS VS Percent: 5.97 %
Brady Statistic RA Percent Paced: 86.99 %
Brady Statistic RV Percent Paced: 93.81 %
Date Time Interrogation Session: 20250707201447
Implantable Lead Connection Status: 753985
Implantable Lead Connection Status: 753985
Implantable Lead Connection Status: 753985
Implantable Lead Implant Date: 20220622
Implantable Lead Implant Date: 20220622
Implantable Lead Implant Date: 20240703
Implantable Lead Location: 753858
Implantable Lead Location: 753859
Implantable Lead Location: 753860
Implantable Lead Model: 3830
Implantable Lead Model: 5076
Implantable Pulse Generator Implant Date: 20240703
Lead Channel Impedance Value: 1064 Ohm
Lead Channel Impedance Value: 1463 Ohm
Lead Channel Impedance Value: 1520 Ohm
Lead Channel Impedance Value: 1672 Ohm
Lead Channel Impedance Value: 1672 Ohm
Lead Channel Impedance Value: 1767 Ohm
Lead Channel Impedance Value: 1805 Ohm
Lead Channel Impedance Value: 247 Ohm
Lead Channel Impedance Value: 285 Ohm
Lead Channel Impedance Value: 456 Ohm
Lead Channel Impedance Value: 570 Ohm
Lead Channel Impedance Value: 760 Ohm
Lead Channel Impedance Value: 836 Ohm
Lead Channel Impedance Value: 969 Ohm
Lead Channel Pacing Threshold Amplitude: 0.625 V
Lead Channel Pacing Threshold Amplitude: 0.875 V
Lead Channel Pacing Threshold Pulse Width: 0.4 ms
Lead Channel Pacing Threshold Pulse Width: 0.4 ms
Lead Channel Sensing Intrinsic Amplitude: 1.125 mV
Lead Channel Sensing Intrinsic Amplitude: 1.125 mV
Lead Channel Setting Pacing Amplitude: 2 V
Lead Channel Setting Pacing Amplitude: 2.5 V
Lead Channel Setting Pacing Amplitude: 2.5 V
Lead Channel Setting Pacing Pulse Width: 0.4 ms
Lead Channel Setting Pacing Pulse Width: 1 ms
Lead Channel Setting Sensing Sensitivity: 4 mV
Zone Setting Status: 755011

## 2024-01-02 ENCOUNTER — Ambulatory Visit: Payer: Self-pay | Admitting: Cardiology

## 2024-01-03 ENCOUNTER — Encounter: Admitting: Internal Medicine

## 2024-01-13 NOTE — Progress Notes (Unsigned)
 Electrophysiology Office Note:   Date:  01/14/2024  ID:  Derek Washington, DOB [birth year], MRN [id number]  Primary Cardiologist: Candyce Reek, MD Electrophysiologist: Fonda Kitty, MD      History of Present Illness:   Derek Washington is a 83 y.o. male with h/o bicuspid aortic valve s/p bioprosthetic AVR, paroxysmal atrial fibrillation/flutter, chronic systolic heart failure, bradycardia s/p pacemaker then upgrade to CRT-P (12/2022) who is being seen today for device follow up.   Discussed the use of AI scribe software for clinical note transcription with the patient, who gave verbal consent to proceed.  He experiences fatigue, particularly after activities such as showering, which necessitates him to sit down and drink water. He also feels lightheaded and has to be cautious during activities.  He suspects many of these symptoms are related to his medications.  He prefers to walk in the morning before taking his medication, and is able to do so for about 30 minutes without too much difficulty. His current medications include carvedilol , Farxiga , Entresto , and spironolactone . He is on a low dose of carvedilol  due to low blood pressure. He has a history of heart failure and has been hospitalized frequently in the past.  No recent hospitalizations.  Able to manage heart failure symptoms accordingly.  No new or acute complaints today.  Review of systems complete and found to be negative unless listed in HPI.   EP Information / Studies Reviewed:    EKG is ordered today. Personal review as below. Atrial paced, BiV paced     Echo 06/06/2023:  1. Left ventricular ejection fraction, by estimation, is 45 to 50%. Left  ventricular ejection fraction by 3D volume is 46 %. The left ventricle has  mildly decreased function. The left ventricle has no regional wall motion  abnormalities. There is  moderate left ventricular hypertrophy of the basal-septal segment. Left  ventricular diastolic parameters  are consistent with Grade I diastolic  dysfunction (impaired relaxation). The average left ventricular global  longitudinal strain is -10.6 %. The  global longitudinal strain is abnormal.   2. Right ventricular systolic function is normal. The right ventricular  size is normal. There is normal pulmonary artery systolic pressure. The  estimated right ventricular systolic pressure is 30.2 mmHg.   3. Left atrial size was mildly dilated.   4. The mitral valve is normal in structure. No evidence of mitral valve  regurgitation. No evidence of mitral stenosis.   5. The aortic valve has been repaired/replaced. Aortic valve  regurgitation is not visualized. No aortic stenosis is present. There is a  25 mm bioprosthetic valve present in the aortic position. Procedure Date:  11/2020. Aortic valve mean gradient measures   10.0 mmHg. Aortic valve Vmax measures 2.09 m/s.   6. Aortic dilatation noted. There is mild dilatation of the ascending  aorta, measuring 40 mm.   7. The inferior vena cava is normal in size with greater than 50%  respiratory variability, suggesting right atrial pressure of 3 mmHg.       Physical Exam:   VS:  BP (!) 94/58 (BP Location: Left Arm, Patient Position: Sitting, Cuff Size: Normal)   Pulse 71   Ht 5' 11 (1.803 m)   Wt 222 lb (100.7 kg)   SpO2 99%   BMI 30.96 kg/m    Wt Readings from Last 3 Encounters:  01/14/24 222 lb (100.7 kg)  10/04/23 225 lb (102.1 kg)  06/06/23 223 lb 9.6 oz (101.4 kg)     GEN: Well nourished, well  developed in no acute distress NECK: No JVD CARDIAC: Normal rate, regular rhythm.  Well-healed left chest pacemaker pocket. RESPIRATORY:  Clear to auscultation without rales, wheezing or rhonchi  ABDOMEN: Soft, non-distended EXTREMITIES:  No edema; No deformity   ASSESSMENT AND PLAN:    #. CRT-P:  - In-clinic device interrogation was performed today.  Appropriate device function and stable lead parameters.  Presenting rhythm is AP-BiV paced.   Estimated longevity 6 years.  No arrhythmias.  PVC burden approximately 6%.  BiV pacing percentage approximately 94%.  No programming changes made today. - Continue remote monitoring.  #. PVCs: Asymptomatic.  Burden estimated at 6%. - Continue to monitor closely with pacemaker. If BiV pacing drops significantly or patient develops clinical heart failure, then recommend antiarrhythmic drug therapy for PVC suppression.  Any significant uptitration of beta-blocker likely to be limited by blood pressure.  #. Paroxysmal AF: AT/AF burden less than 0.1% on device. #. Secondary hypercoagulable state due to atrial fibrillation: -Continue monitoring AF burden with device. -Continue carvedilol . -Continue Eliquis  5 mg twice daily.  #. Chronic HFmrEF: Well compensated on exam. - Continue GDMT regimen of carvedilol  6.25 mg twice daily, Entresto  24-26 mg twice daily, dapagliflozin  10 mg daily, spironolactone  25 mg daily.  Continue close follow-up with HF clinic.  Follow up with Dr. Kennyth in 6 months  Signed, Fonda Kennyth, MD

## 2024-01-14 ENCOUNTER — Ambulatory Visit: Attending: Internal Medicine | Admitting: Cardiology

## 2024-01-14 VITALS — BP 94/58 | HR 71 | Ht 71.0 in | Wt 222.0 lb

## 2024-01-14 DIAGNOSIS — I5022 Chronic systolic (congestive) heart failure: Secondary | ICD-10-CM | POA: Diagnosis not present

## 2024-01-14 DIAGNOSIS — D6869 Other thrombophilia: Secondary | ICD-10-CM | POA: Diagnosis not present

## 2024-01-14 DIAGNOSIS — I480 Paroxysmal atrial fibrillation: Secondary | ICD-10-CM

## 2024-01-14 DIAGNOSIS — Z950 Presence of cardiac pacemaker: Secondary | ICD-10-CM

## 2024-01-14 DIAGNOSIS — I493 Ventricular premature depolarization: Secondary | ICD-10-CM

## 2024-01-14 NOTE — Patient Instructions (Signed)
 Medication Instructions:  Your physician recommends that you continue on your current medications as directed. Please refer to the Current Medication list given to you today.  *If you need a refill on your cardiac medications before your next appointment, please call your pharmacy*  Follow-Up: At [facility], you and your health needs are our priority.  As part of our continuing mission to provide you with exceptional heart care, our providers are all part of one team.  This team includes your primary Cardiologist (physician) and Advanced Practice Providers or APPs (Physician Assistants and Nurse Practitioners) who all work together to provide you with the care you need, when you need it.  Your next appointment:   6 months  Provider:   You may see Ardeen Kohler, MD or one of the following Advanced Practice Providers on your designated Care Team:   Mertha Abrahams, [place] [address] [place], PA-C Suzann Riddle, NP Creighton Doffing, NP

## 2024-01-23 ENCOUNTER — Other Ambulatory Visit (HOSPITAL_COMMUNITY): Payer: Self-pay | Admitting: Cardiology

## 2024-01-23 DIAGNOSIS — I5022 Chronic systolic (congestive) heart failure: Secondary | ICD-10-CM

## 2024-01-24 DIAGNOSIS — I480 Paroxysmal atrial fibrillation: Secondary | ICD-10-CM | POA: Diagnosis not present

## 2024-01-24 DIAGNOSIS — M48061 Spinal stenosis, lumbar region without neurogenic claudication: Secondary | ICD-10-CM | POA: Diagnosis not present

## 2024-01-24 DIAGNOSIS — D6869 Other thrombophilia: Secondary | ICD-10-CM | POA: Diagnosis not present

## 2024-01-24 DIAGNOSIS — N1831 Chronic kidney disease, stage 3a: Secondary | ICD-10-CM | POA: Diagnosis not present

## 2024-01-24 DIAGNOSIS — I5022 Chronic systolic (congestive) heart failure: Secondary | ICD-10-CM | POA: Diagnosis not present

## 2024-01-24 DIAGNOSIS — I10 Essential (primary) hypertension: Secondary | ICD-10-CM | POA: Diagnosis not present

## 2024-01-24 DIAGNOSIS — Z952 Presence of prosthetic heart valve: Secondary | ICD-10-CM | POA: Diagnosis not present

## 2024-01-24 DIAGNOSIS — I7121 Aneurysm of the ascending aorta, without rupture: Secondary | ICD-10-CM | POA: Diagnosis not present

## 2024-01-24 DIAGNOSIS — Z79899 Other long term (current) drug therapy: Secondary | ICD-10-CM | POA: Diagnosis not present

## 2024-01-24 DIAGNOSIS — M109 Gout, unspecified: Secondary | ICD-10-CM | POA: Diagnosis not present

## 2024-01-24 DIAGNOSIS — Z950 Presence of cardiac pacemaker: Secondary | ICD-10-CM | POA: Diagnosis not present

## 2024-01-24 DIAGNOSIS — E559 Vitamin D deficiency, unspecified: Secondary | ICD-10-CM | POA: Diagnosis not present

## 2024-01-24 DIAGNOSIS — Z0000 Encounter for general adult medical examination without abnormal findings: Secondary | ICD-10-CM | POA: Diagnosis not present

## 2024-02-25 DIAGNOSIS — H35372 Puckering of macula, left eye: Secondary | ICD-10-CM | POA: Diagnosis not present

## 2024-02-25 DIAGNOSIS — H31091 Other chorioretinal scars, right eye: Secondary | ICD-10-CM | POA: Diagnosis not present

## 2024-02-25 DIAGNOSIS — H43822 Vitreomacular adhesion, left eye: Secondary | ICD-10-CM | POA: Diagnosis not present

## 2024-02-25 DIAGNOSIS — H348312 Tributary (branch) retinal vein occlusion, right eye, stable: Secondary | ICD-10-CM | POA: Diagnosis not present

## 2024-03-24 ENCOUNTER — Ambulatory Visit: Payer: Medicare Other

## 2024-03-24 DIAGNOSIS — I5022 Chronic systolic (congestive) heart failure: Secondary | ICD-10-CM | POA: Diagnosis not present

## 2024-03-26 LAB — CUP PACEART REMOTE DEVICE CHECK
Battery Remaining Longevity: 75 mo
Battery Voltage: 2.98 V
Brady Statistic AP VP Percent: 78.77 %
Brady Statistic AP VS Percent: 0.17 %
Brady Statistic AS VP Percent: 14.13 %
Brady Statistic AS VS Percent: 6.93 %
Brady Statistic RA Percent Paced: 83.09 %
Brady Statistic RV Percent Paced: 92.89 %
Date Time Interrogation Session: 20251007024333
Implantable Lead Connection Status: 753985
Implantable Lead Connection Status: 753985
Implantable Lead Connection Status: 753985
Implantable Lead Implant Date: 20220622
Implantable Lead Implant Date: 20220622
Implantable Lead Implant Date: 20240703
Implantable Lead Location: 753858
Implantable Lead Location: 753859
Implantable Lead Location: 753860
Implantable Lead Model: 3830
Implantable Lead Model: 5076
Implantable Pulse Generator Implant Date: 20240703
Lead Channel Impedance Value: 1064 Ohm
Lead Channel Impedance Value: 1520 Ohm
Lead Channel Impedance Value: 1539 Ohm
Lead Channel Impedance Value: 1672 Ohm
Lead Channel Impedance Value: 1748 Ohm
Lead Channel Impedance Value: 1786 Ohm
Lead Channel Impedance Value: 1786 Ohm
Lead Channel Impedance Value: 247 Ohm
Lead Channel Impedance Value: 304 Ohm
Lead Channel Impedance Value: 475 Ohm
Lead Channel Impedance Value: 589 Ohm
Lead Channel Impedance Value: 798 Ohm
Lead Channel Impedance Value: 893 Ohm
Lead Channel Impedance Value: 969 Ohm
Lead Channel Pacing Threshold Amplitude: 0.625 V
Lead Channel Pacing Threshold Amplitude: 1.125 V
Lead Channel Pacing Threshold Pulse Width: 0.4 ms
Lead Channel Pacing Threshold Pulse Width: 0.4 ms
Lead Channel Sensing Intrinsic Amplitude: 1.75 mV
Lead Channel Sensing Intrinsic Amplitude: 1.75 mV
Lead Channel Setting Pacing Amplitude: 2 V
Lead Channel Setting Pacing Amplitude: 2.5 V
Lead Channel Setting Pacing Amplitude: 2.5 V
Lead Channel Setting Pacing Pulse Width: 0.4 ms
Lead Channel Setting Pacing Pulse Width: 1 ms
Lead Channel Setting Sensing Sensitivity: 4 mV
Zone Setting Status: 755011

## 2024-03-27 NOTE — Progress Notes (Signed)
 Remote PPM Transmission

## 2024-03-29 ENCOUNTER — Ambulatory Visit: Payer: Self-pay | Admitting: Cardiology

## 2024-04-06 ENCOUNTER — Other Ambulatory Visit (HOSPITAL_COMMUNITY): Payer: Self-pay | Admitting: Cardiology

## 2024-04-06 DIAGNOSIS — I5022 Chronic systolic (congestive) heart failure: Secondary | ICD-10-CM

## 2024-04-15 ENCOUNTER — Ambulatory Visit (HOSPITAL_COMMUNITY): Payer: Self-pay | Admitting: Cardiology

## 2024-04-15 ENCOUNTER — Encounter (HOSPITAL_COMMUNITY): Payer: Self-pay | Admitting: Cardiology

## 2024-04-15 ENCOUNTER — Ambulatory Visit (HOSPITAL_COMMUNITY)
Admission: RE | Admit: 2024-04-15 | Discharge: 2024-04-15 | Disposition: A | Discharge status: Home / Self Care | Source: Ambulatory Visit | Attending: Cardiology | Admitting: Cardiology

## 2024-04-15 VITALS — BP 110/72 | HR 67 | Ht 71.0 in | Wt 224.4 lb

## 2024-04-15 DIAGNOSIS — I480 Paroxysmal atrial fibrillation: Secondary | ICD-10-CM | POA: Insufficient documentation

## 2024-04-15 DIAGNOSIS — Z7901 Long term (current) use of anticoagulants: Secondary | ICD-10-CM | POA: Diagnosis not present

## 2024-04-15 DIAGNOSIS — Z7984 Long term (current) use of oral hypoglycemic drugs: Secondary | ICD-10-CM | POA: Insufficient documentation

## 2024-04-15 DIAGNOSIS — Z950 Presence of cardiac pacemaker: Secondary | ICD-10-CM | POA: Insufficient documentation

## 2024-04-15 DIAGNOSIS — I2510 Atherosclerotic heart disease of native coronary artery without angina pectoris: Secondary | ICD-10-CM | POA: Diagnosis not present

## 2024-04-15 DIAGNOSIS — I7121 Aneurysm of the ascending aorta, without rupture: Secondary | ICD-10-CM | POA: Diagnosis not present

## 2024-04-15 DIAGNOSIS — I714 Abdominal aortic aneurysm, without rupture, unspecified: Secondary | ICD-10-CM

## 2024-04-15 DIAGNOSIS — Z952 Presence of prosthetic heart valve: Secondary | ICD-10-CM | POA: Insufficient documentation

## 2024-04-15 DIAGNOSIS — I493 Ventricular premature depolarization: Secondary | ICD-10-CM | POA: Insufficient documentation

## 2024-04-15 DIAGNOSIS — I5022 Chronic systolic (congestive) heart failure: Secondary | ICD-10-CM | POA: Insufficient documentation

## 2024-04-15 DIAGNOSIS — R55 Syncope and collapse: Secondary | ICD-10-CM | POA: Diagnosis present

## 2024-04-15 LAB — BASIC METABOLIC PANEL WITH GFR
Anion gap: 15 (ref 5–15)
BUN: 43 mg/dL — ABNORMAL HIGH (ref 8–23)
CO2: 23 mmol/L (ref 22–32)
Calcium: 9.4 mg/dL (ref 8.9–10.3)
Chloride: 100 mmol/L (ref 98–111)
Creatinine, Ser: 1.49 mg/dL — ABNORMAL HIGH (ref 0.61–1.24)
GFR, Estimated: 47 mL/min — ABNORMAL LOW (ref >60)
Glucose, Bld: 104 mg/dL — ABNORMAL HIGH (ref 70–99)
Potassium: 4.2 mmol/L (ref 3.5–5.1)
Sodium: 138 mmol/L (ref 135–145)

## 2024-04-15 LAB — BRAIN NATRIURETIC PEPTIDE: B Natriuretic Peptide: 306 pg/mL — ABNORMAL HIGH (ref 0.0–100.0)

## 2024-04-15 MED ORDER — FUROSEMIDE 20 MG PO TABS: 20.0000 mg | ORAL_TABLET | Freq: Every day | ORAL | 5 refills | Status: DC

## 2024-04-15 NOTE — Patient Instructions (Signed)
 Medication Changes:  DECREASE LASIX  (FUROSEMIDE ) TO 20MG  ONCE DAILY   Lab Work:  Labs done today, your results will be available in MyChart, we will contact you for abnormal readings.  Testing/Procedures:  ECHOCARDIOGRAM AS SCHEDULED IN DECEMBER   MRA OF THE CHEST---SCHEDULING WILL CALL TO ARRANGE THIS ONCE APPROVED WITH INSURANCE   Special Instructions // Education:  COMPRESSION STOCKING ORDER GIVEN   Follow-Up in: 3 MONTHS WITH APP AS SCHEDULED   At the [facility], you and your health needs are our priority. We have a designated team specialized in the treatment of Heart Failure. This Care Team includes your primary Heart Failure Specialized Cardiologist (physician), Advanced Practice Providers (APPs- Physician Assistants and Nurse Practitioners), and Pharmacist who all work together to provide you with the care you need, when you need it.   You may see any of the following providers on your designated Care Team at your next follow up:  Dr. Toribio Fuel Dr. Ezra Shuck Dr. Odis Brownie Greig Mosses, NP Caffie Shed, [place] [facility] [place], [place] Beckey Coe, NP Jordan Lee, NP Tinnie Redman, PharmD   Please be sure to bring in all your medications bottles to every appointment.   Need to Contact Us :  If you have any questions or concerns before your next appointment please send us  a message through [place] or call our office at [phone].    TO LEAVE A MESSAGE FOR THE NURSE SELECT OPTION 2, PLEASE LEAVE A MESSAGE INCLUDING: YOUR NAME DATE OF BIRTH CALL BACK NUMBER REASON FOR CALL**this is important as we prioritize the call backs  YOU WILL RECEIVE A CALL BACK THE SAME DAY AS LONG AS YOU CALL BEFORE 4:00 PM

## 2024-04-15 NOTE — Progress Notes (Signed)
 [facility] Progress Note   PCP: Charlott Dorn LABOR, MD HF Cardiology: Dr. Rolan  Chief complaint: CHF  83 y.o. with history of bicuspid aortic valve s/p bioprosthetic AVR, bradycardia with Medtronic PPM, paroxysmal atrial fibrillation/flutter, and chronic HF with mid range EF was referred by Dr. Dann for evaluation for possible cardiac amyloidosis. Patient had bioprosthetic aortic valve replacement in 6/22.  Pre-op, cath showed minimal CAD in 5/22.  Post-op, he developed symptomatic bradycardia and had Medtronic PPM placed.  He is primarily v-paced now.  He has paroxysmal atrial fibrillation but is generally in NSR.  Initial echo in 8/22 post-AVR showed EF 55-60%.  However, echoes in 9/23 and 12/23 showed EF 40-45%, mild LVH, mild RV enlargement with low normal systolic function, bioprosthetic aortic valve with mean gradient 16 mmHg. He had a PYP scan in 2/24 that was grade 1 with H/CL ratio > 1.5 but patient has increased uptake in the sternum and the left lateral ribs which may have falsely elevated the H/CL ration based on sample volume location.  This study seems at most equivocal.  Patient does report a bad fall onto his left side with rib pain towards the end of 2023.  Bone survey was done that was normal in 2/24.    Cardiac MRI in 5/24 showed LV EF 33%, moderate LVh, septal hypokinesis and septal-lateral dyssynchrony, RV EF 46%, ECV 25%, no LGE.    Patient had upgrade of device to Medtronic CRT-P in 7/24.   Echo 12/24 EF 45-50%, moderate focal basal septal hypertrophy, normal RV, bioprosthetic aortic valve mean gradient 10 mmHg, IVC normal.   He returns today for followup of CHF.  Weight stable. He continues to have orthostatic symptoms, gets lightheaded 30-45 minutes after taking his medications though it is manageable generally.  Rare episodes of presyncope (has had 3 in the past year).  No loss of consciousness.  No palpitations.  No chest pain.  No significant  exertional dyspnea.   ECG (personally reviewed): A-BiV paced.   Labs (1/24): SPEP normal, TSH normal, pro-BNP 1544, K 4.5, creatinine 1.19 Labs (2/24): Myeloma panel negative, urine immunofixation negative, K 4.3, creatinine 1.19, BNP 362 Labs (7/24): K 4.3, creatinine 1.25 Labs (12/24): K 4.4, creatinine 1.27, LDL 66 Labs (4/25): K 4.7, creatinine 1.46, hgb 14.1  Medtronic device interrogation: 91% effective Bi-V pacing, no AT/AF, stable thoracic impedance.   PMH: 1. Bicuspid aortic valve disorder: With severe AS, s/p bioprosthetic aortic valve replacement 6/22.  - 4.2 cm ascending aorta by CTA 8/23.  2. AKI in setting of IV contrast with CTA in 8/23, required transient HD.  3. Nephrolithiasis 4. HTN 5. Gout 6. BPPV 7. Paroxysmal atrial fibrillation/flutter.  8. Bradycardia: Post-op AVR, has Medtronic CRT-P device.   9. CAD: LHC in 5/22 with 50% stenosis in PLV branch, otherwise no significant disease.  10. Chronic HF with mid-range EF: Echo in 8/22 post-AVR with EF 55-60%.  - Echo (9/23): EF 40-45% - Echo (12/23): EF 40-45%, mild LVH, mild RV enlargement with low normal function, bioprosthetic aortic valve with mean gradient 16 mmHg.  - PYP scan (2/24): Grade 1, H/CL ratio > 1.5 but patient has increased uptake in the sternum and the left lateral ribs which may have falsely elevated the H/CL ration based on sample volume location.  This study seems at most equivocal. Bone survey done in 2/24 was normal.  - Cardiac MRI (5/24): LV EF 33%, moderate LVh, septal hypokinesis and septal-lateral dyssynchrony, RV EF 46%, ECV  25%, no LGE.   - Device upgraded to Medtronic CRT-P (7/24) - Echo (12/24): EF 45-50%, moderate focal basal septal hypertrophy, normal RV, bioprosthetic aortic valve mean gradient 10 mmHg, IVC normal.  11. Spinal stenosis 12. PVCs  SH: Retired naval architect, married, no smoking, rare ETOH.   Family History  Problem Relation Age of Onset   Other Mother 85       HEAVY  SMOKER   COPD Father 41       HEAVY SMOKER   Other Sister 28       HEAVY SMOKER   ROS: All systems reviewed and negative except as per HPI.   Current Outpatient Medications  Medication Sig Dispense Refill   acetaminophen  (TYLENOL ) 325 MG tablet Take 2 tablets (650 mg total) by mouth every 6 (six) hours as needed for mild pain (or Fever >/= 101).     apixaban  (ELIQUIS ) 5 MG TABS tablet Take 1 tablet (5 mg total) by mouth 2 (two) times daily. 180 tablet 3   atorvastatin  (LIPITOR) 20 MG tablet Take 20 mg by mouth every evening.     carvedilol  (COREG ) 6.25 MG tablet Take 3.125 mg by mouth in the morning. 1 tablet in the PM     cholecalciferol  (VITAMIN D ) 25 MCG (1000 UNIT) tablet Take 1,000 Units by mouth in the morning.     dapagliflozin  propanediol (FARXIGA ) 10 MG TABS tablet Take 1 tablet (10 mg total) by mouth daily before breakfast. 30 tablet 11   finasteride  (PROSCAR ) 5 MG tablet Take 5 mg by mouth in the morning.     Multiple Vitamin (MULTIVITAMIN WITH MINERALS) TABS tablet Take 1 tablet by mouth in the morning.     Omega-3 Fatty Acids (FISH OIL) 1000 MG CAPS Take 1,000 mg by mouth 2 (two) times daily.     sacubitril-valsartan (ENTRESTO ) 24-26 MG Take 1/2 tablet in the am and 1 tablet in the pm. 180 tablet 3   spironolactone  (ALDACTONE ) 25 MG tablet TAKE 1 TABLET(25 MG) BY MOUTH DAILY 90 tablet 3   furosemide  (LASIX ) 20 MG tablet Take 1 tablet (20 mg total) by mouth daily. 30 tablet 5   No current facility-administered medications for this encounter.   BP 110/72   Pulse 67   Ht 5' 11 (1.803 m)   Wt 101.8 kg (224 lb 6.4 oz)   SpO2 97%   BMI 31.30 kg/m  PHYSICAL EXAM: General: NAD Neck: No JVD, no thyromegaly or thyroid  nodule.  Lungs: Clear to auscultation bilaterally with normal respiratory effort. CV: Nondisplaced PMI.  Heart regular S1/S2, no S3/S4, no murmur.  No peripheral edema.  No carotid bruit.  Normal pedal pulses.  Abdomen: Soft, nontender, no hepatosplenomegaly, no  distention.  Skin: Intact without lesions or rashes.  Neurologic: Alert and oriented x 3.  Psych: Normal affect. Extremities: No clubbing or cyanosis.  HEENT: Normal.   Assessment/Plan: 1. Chronic HF with mid range EF:  Echo in 9/23 and again in 12/23 showed EF 40-45%, mild LVH, mild RV enlargement with low normal function, bioprosthetic aortic valve with mean gradient 16 mmHg.  This is down from EF 55-60% on 8/22 echo.  Pre-AVR cath in 5/22 showed only mild CAD and he has had no chest pain, think ischemic CMP is unlikely.  Cardiac amyloidosis has been raised as a concern.  PYP scan in 2/24 was abnormal, but I think this study is at most equivocal. It was read as grade 1, H/CL ratio > 1.5 but patient has increased  uptake in the sternum and the left lateral ribs which may have falsely elevated the H/CL ration based on sample volume location (bone survey done later in 2/24 was normal).  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  Cardiac MRI in 5/24 was not suggestive of cardiac amyloidosis, showing LV EF 33%, moderate LVh, septal hypokinesis and septal-lateral dyssynchrony, RV EF 46%, ECV 25%, no LGE.  He had a device upgrade to MDT CRT-P in 7/24.  Most recent echo 12/24 showed EF 45-50%, moderate focal basal septal hypertrophy, normal RV, bioprosthetic aortic valve mean gradient 10 mmHg, IVC normal.  He is only BiV pacing about 91% of the time, likely due to PVCs. NYHA class I-II symptoms and not volume overloaded by exam or Optivol.  Main complaint is orthostatic symptoms.  - With orthostasis and euvolemia on exam, decrease Lasix  to 20 mg daily and will have him wear graded compression stockings.  - Continue Coreg  3.125 qam/6.25 qpm.  I would like to increase this with PVCs, but will not do so given orthostatic symptoms.  - Continue Entresto  24/26 bid.  - Continue Farxiga  10 mg daily.  - Continue spironolactone  25 mg daily.  BMET/BNP today.  -  Repeat echo in 12/25.  2. Bioprosthetic aortic valve: stable on echo 12/24, mean gradient 10 mmHg  3. PVCs: Noted on device interrogation, impair optimal BiV pacing.  He is BiV pacing 91% of the time, but would not increase Coreg  with orthostatic symptoms. If EF is stable on 12/25 echo, would not add anti-arrhythmic medication.  4. Atrial fibrillation: Paroxysmal. No AF on device interrogation today.  - Continue Eliquis .  5. CAD: Mild on 5/22 cath. No chest pain.  - Continue Atorvastatin  40 mg. Check lipids.   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  8/23 CTA chest 4.2 cm.  Patient had AKI with CT contrast, would avoid in future.  Cannot get MRA chest with his current device.  Will need to follow by echo.    Followup in 3 months with APP.   I spent 32 minutes reviewing records, interviewing/examining patient, and managing orders.   Ezra Shuck,  04/15/2024

## 2024-04-16 ENCOUNTER — Telehealth (HOSPITAL_COMMUNITY): Payer: Self-pay | Admitting: *Deleted

## 2024-04-16 NOTE — Telephone Encounter (Signed)
 Called patient per Dr. Rolan with following lab results:  Creatinine elevated but cutting back on Lasix .  Confirmed with patient that he did decrease his Lasix  dose to 20 mg daily. No further questions at this time.

## 2024-04-21 ENCOUNTER — Telehealth (HOSPITAL_COMMUNITY): Payer: Self-pay

## 2024-04-21 NOTE — Telephone Encounter (Signed)
 Called patient and made him aware. Patient is scheduled for Echo on 12/17 to follow up. Patient aware of appointment time and date and verbalized understanding.   Advised patient to call back to office with any issues, questions, or concerns. Patient verbalized understanding.

## 2024-04-21 NOTE — Telephone Encounter (Signed)
-----   Message from Ezra Shuck sent at 04/15/2024  5:43 PM EDT ----- Regarding: RE: MRA ORDERED Tell him we'll follow it by echo. ----- Message ----- From: Tita Andriette NOVAK, RN Sent: 04/15/2024   3:56 PM EDT To: Ezra GORMAN Shuck, MD; Lisa CHRISTELLA Sergeant, RN Subject: FW: MRA ORDERED                                Hey Dr. Shuck  He can't have the MRA of his chest due to his device no longer being MRI compatible. Is there another scan that you would like? He is allergic to iodinated contrast as well.   Just let me know.   Thank you  Andriette FURY ----- Message ----- From: Lesia Ozell Barter, DEVONNA Sent: 04/15/2024   3:06 PM EDT To: Delon DELENA Sharps, RN; Andriette NOVAK Tita, RN Subject: RE: MRA ORDERED                                Yeah, unfortunately his device upgrade last year was done with a different lead, so he was safe before but is no longer. ----- Message ----- From: Sharps Delon DELENA, RN Sent: 04/15/2024   3:02 PM EDT To: Ozell Barter Lesia, PA-C; Andriette NOVAK Bullin# Subject: RE: MRA ORDERED                                Hey Koah Chisenhall,  He has a mixed system.  His LV lead is an Abbott, but his device and 2 of his leads are Medtronic.  It is my understanding at this time they will not scan that type of device at [facility].  I have tagged Jodie, because sometimes he knows about things before I do.  Randall ----- Message ----- From: Tita Andriette NOVAK, RN Sent: 04/15/2024   2:54 PM EDT To: [facility] Subject: MRA ORDERED                                    Hi, this patient is ordered a MRA chest for AAA eval. Is this okay with his device?   Thank you  Raad Clayson B. RN

## 2024-05-07 ENCOUNTER — Encounter: Payer: Self-pay | Admitting: Cardiology

## 2024-06-03 ENCOUNTER — Ambulatory Visit (HOSPITAL_COMMUNITY): Admission: RE | Admit: 2024-06-03 | Discharge: 2024-06-03 | Attending: Cardiology | Admitting: Cardiology

## 2024-06-03 DIAGNOSIS — I5022 Chronic systolic (congestive) heart failure: Secondary | ICD-10-CM | POA: Diagnosis not present

## 2024-06-03 DIAGNOSIS — I110 Hypertensive heart disease with heart failure: Secondary | ICD-10-CM | POA: Insufficient documentation

## 2024-06-03 DIAGNOSIS — I071 Rheumatic tricuspid insufficiency: Secondary | ICD-10-CM | POA: Diagnosis not present

## 2024-06-03 DIAGNOSIS — Z952 Presence of prosthetic heart valve: Secondary | ICD-10-CM | POA: Insufficient documentation

## 2024-06-03 LAB — ECHOCARDIOGRAM COMPLETE
AR max vel: 1.73 cm2
AV Area VTI: 1.82 cm2
AV Area mean vel: 1.68 cm2
AV Mean grad: 8.5 mmHg
AV Peak grad: 15.3 mmHg
Ao pk vel: 1.96 m/s
Area-P 1/2: 1.59 cm2
Calc EF: 54.7 %
S' Lateral: 3.4 cm
Single Plane A2C EF: 59.2 %
Single Plane A4C EF: 51.2 %

## 2024-06-05 NOTE — Telephone Encounter (Addendum)
 Pt aware, agreeable, and verbalized understanding   ----- Message from Ezra Shuck, MD sent at 06/03/2024  8:22 PM EST ----- EF is lower than prior at 35-40% with mild RV dysfunction.  Would have him followup in clinic for reassessment.

## 2024-06-16 ENCOUNTER — Other Ambulatory Visit (HOSPITAL_COMMUNITY): Payer: Self-pay | Admitting: Cardiology

## 2024-06-16 DIAGNOSIS — I5022 Chronic systolic (congestive) heart failure: Secondary | ICD-10-CM

## 2024-06-23 ENCOUNTER — Ambulatory Visit: Payer: Medicare Other

## 2024-06-23 DIAGNOSIS — I442 Atrioventricular block, complete: Secondary | ICD-10-CM | POA: Diagnosis not present

## 2024-06-25 ENCOUNTER — Ambulatory Visit: Payer: Self-pay | Admitting: Cardiology

## 2024-06-25 LAB — CUP PACEART REMOTE DEVICE CHECK
Battery Remaining Longevity: 72 mo
Battery Voltage: 2.98 V
Brady Statistic AP VP Percent: 63.77 %
Brady Statistic AP VS Percent: 0.06 %
Brady Statistic AS VP Percent: 34.13 %
Brady Statistic AS VS Percent: 2.04 %
Brady Statistic RA Percent Paced: 65.01 %
Brady Statistic RV Percent Paced: 97.9 %
Date Time Interrogation Session: 20260105222748
Implantable Lead Connection Status: 753985
Implantable Lead Connection Status: 753985
Implantable Lead Connection Status: 753985
Implantable Lead Implant Date: 20220622
Implantable Lead Implant Date: 20220622
Implantable Lead Implant Date: 20240703
Implantable Lead Location: 753858
Implantable Lead Location: 753859
Implantable Lead Location: 753860
Implantable Lead Model: 3830
Implantable Lead Model: 5076
Implantable Pulse Generator Implant Date: 20240703
Lead Channel Impedance Value: 1444 Ohm
Lead Channel Impedance Value: 1463 Ohm
Lead Channel Impedance Value: 1558 Ohm
Lead Channel Impedance Value: 1615 Ohm
Lead Channel Impedance Value: 1615 Ohm
Lead Channel Impedance Value: 1653 Ohm
Lead Channel Impedance Value: 247 Ohm
Lead Channel Impedance Value: 304 Ohm
Lead Channel Impedance Value: 475 Ohm
Lead Channel Impedance Value: 589 Ohm
Lead Channel Impedance Value: 760 Ohm
Lead Channel Impedance Value: 817 Ohm
Lead Channel Impedance Value: 912 Ohm
Lead Channel Impedance Value: 931 Ohm
Lead Channel Pacing Threshold Amplitude: 0.5 V
Lead Channel Pacing Threshold Amplitude: 1.125 V
Lead Channel Pacing Threshold Pulse Width: 0.4 ms
Lead Channel Pacing Threshold Pulse Width: 0.4 ms
Lead Channel Sensing Intrinsic Amplitude: 2.125 mV
Lead Channel Sensing Intrinsic Amplitude: 2.125 mV
Lead Channel Setting Pacing Amplitude: 2 V
Lead Channel Setting Pacing Amplitude: 2.5 V
Lead Channel Setting Pacing Amplitude: 2.5 V
Lead Channel Setting Pacing Pulse Width: 0.4 ms
Lead Channel Setting Pacing Pulse Width: 1 ms
Lead Channel Setting Sensing Sensitivity: 4 mV
Zone Setting Status: 755011

## 2024-06-29 NOTE — Progress Notes (Signed)
 Remote PPM Transmission

## 2024-07-06 ENCOUNTER — Other Ambulatory Visit (HOSPITAL_COMMUNITY): Payer: Self-pay | Admitting: Cardiology

## 2024-07-15 ENCOUNTER — Telehealth (HOSPITAL_COMMUNITY): Payer: Self-pay

## 2024-07-15 NOTE — Progress Notes (Incomplete)
 [facility] Progress Note   PCP: Charlott Dorn LABOR, MD HF Cardiology: Dr. Rolan  Chief complaint: CHF  84 y.o. with history of bicuspid aortic valve s/p bioprosthetic AVR, bradycardia with Medtronic PPM, paroxysmal atrial fibrillation/flutter, and chronic HF with mid range EF was referred by Dr. Dann for evaluation for possible cardiac amyloidosis. Patient had bioprosthetic aortic valve replacement in 6/22.  Pre-op, cath showed minimal CAD in 5/22.  Post-op, he developed symptomatic bradycardia and had Medtronic PPM placed.  He is primarily v-paced now.  He has paroxysmal atrial fibrillation but is generally in NSR.  Initial echo in 8/22 post-AVR showed EF 55-60%.  However, echoes in 9/23 and 12/23 showed EF 40-45%, mild LVH, mild RV enlargement with low normal systolic function, bioprosthetic aortic valve with mean gradient 16 mmHg. He had a PYP scan in 2/24 that was grade 1 with H/CL ratio > 1.5 but patient has increased uptake in the sternum and the left lateral ribs which may have falsely elevated the H/CL ration based on sample volume location.  This study seems at most equivocal.  Patient does report a bad fall onto his left side with rib pain towards the end of 2023.  Bone survey was done that was normal in 2/24.    Cardiac MRI in 5/24 showed LV EF 33%, moderate LVh, septal hypokinesis and septal-lateral dyssynchrony, RV EF 46%, ECV 25%, no LGE.    Patient had upgrade of device to Medtronic CRT-P in 7/24.   Echo 12/24 EF 45-50%, moderate focal basal septal hypertrophy, normal RV, bioprosthetic aortic valve mean gradient 10 mmHg, IVC normal.   Echo 12/25: EF 35-40%, RV mildly reduced, aortic valve prosthesis appears okay but not well visualized  He returns today for followup of CHF.  Weight stable. He continues to have orthostatic symptoms, gets lightheaded 30-45 minutes after taking his medications though it is manageable generally.  Rare episodes of presyncope (has  had 3 in the past year).  No loss of consciousness.  No palpitations.  No chest pain.  No significant exertional dyspnea.   Here today for CHF follow-up.   ECG (personally reviewed): A-BiV paced.   Labs (1/24): SPEP normal, TSH normal, pro-BNP 1544, K 4.5, creatinine 1.19 Labs (2/24): Myeloma panel negative, urine immunofixation negative, K 4.3, creatinine 1.19, BNP 362 Labs (7/24): K 4.3, creatinine 1.25 Labs (12/24): K 4.4, creatinine 1.27, LDL 66 Labs (4/25): K 4.7, creatinine 1.46, hgb 14.1  Medtronic device interrogation: 91% effective Bi-V pacing, no AT/AF, stable thoracic impedance.   PMH: 1. Bicuspid aortic valve disorder: With severe AS, s/p bioprosthetic aortic valve replacement 6/22.  - 4.2 cm ascending aorta by CTA 8/23.  2. AKI in setting of IV contrast with CTA in 8/23, required transient HD.  3. Nephrolithiasis 4. HTN 5. Gout 6. BPPV 7. Paroxysmal atrial fibrillation/flutter.  8. Bradycardia: Post-op AVR, has Medtronic CRT-P device.   9. CAD: LHC in 5/22 with 50% stenosis in PLV branch, otherwise no significant disease.  10. Chronic HF with mid-range EF: Echo in 8/22 post-AVR with EF 55-60%.  - Echo (9/23): EF 40-45% - Echo (12/23): EF 40-45%, mild LVH, mild RV enlargement with low normal function, bioprosthetic aortic valve with mean gradient 16 mmHg.  - PYP scan (2/24): Grade 1, H/CL ratio > 1.5 but patient has increased uptake in the sternum and the left lateral ribs which may have falsely elevated the H/CL ration based on sample volume location.  This study seems at most equivocal. Bone survey  done in 2/24 was normal.  - Cardiac MRI (5/24): LV EF 33%, moderate LVh, septal hypokinesis and septal-lateral dyssynchrony, RV EF 46%, ECV 25%, no LGE.   - Device upgraded to Medtronic CRT-P (7/24) - Echo (12/24): EF 45-50%, moderate focal basal septal hypertrophy, normal RV, bioprosthetic aortic valve mean gradient 10 mmHg, IVC normal.  11. Spinal stenosis 12. PVCs  SH:  Retired naval architect, married, no smoking, rare ETOH.   Family History  Problem Relation Age of Onset   Other Mother 33       HEAVY SMOKER   COPD Father 77       HEAVY SMOKER   Other Sister 19       HEAVY SMOKER   ROS: All systems reviewed and negative except as per HPI.   Current Outpatient Medications  Medication Sig Dispense Refill   acetaminophen  (TYLENOL ) 325 MG tablet Take 2 tablets (650 mg total) by mouth every 6 (six) hours as needed for mild pain (or Fever >/= 101).     apixaban  (ELIQUIS ) 5 MG TABS tablet Take 1 tablet (5 mg total) by mouth 2 (two) times daily. 180 tablet 3   atorvastatin  (LIPITOR) 20 MG tablet Take 20 mg by mouth every evening.     carvedilol  (COREG ) 6.25 MG tablet Take 3.125 mg by mouth in the morning. 1 tablet in the PM     cholecalciferol  (VITAMIN D ) 25 MCG (1000 UNIT) tablet Take 1,000 Units by mouth in the morning.     dapagliflozin  propanediol (FARXIGA ) 10 MG TABS tablet Take 1 tablet (10 mg total) by mouth daily before breakfast. 30 tablet 11   finasteride  (PROSCAR ) 5 MG tablet Take 5 mg by mouth in the morning.     furosemide  (LASIX ) 20 MG tablet TAKE 1 TABLET BY MOUTH DAILY 100 tablet 2   Multiple Vitamin (MULTIVITAMIN WITH MINERALS) TABS tablet Take 1 tablet by mouth in the morning.     Omega-3 Fatty Acids (FISH OIL) 1000 MG CAPS Take 1,000 mg by mouth 2 (two) times daily.     sacubitril-valsartan (ENTRESTO ) 24-26 MG TAKE ONE-HALF TABLET BY MOUTH IN THE MORNING AND 1 TABLET BY  MOUTH IN THE EVENING 150 tablet 2   spironolactone  (ALDACTONE ) 25 MG tablet TAKE 1 TABLET(25 MG) BY MOUTH DAILY 90 tablet 3   No current facility-administered medications for this visit.   There were no vitals taken for this visit. PHYSICAL EXAM: General: NAD Neck: No JVD, no thyromegaly or thyroid  nodule.  Lungs: Clear to auscultation bilaterally with normal respiratory effort. CV: Nondisplaced PMI.  Heart regular S1/S2, no S3/S4, no murmur.  No peripheral edema.  No  carotid bruit.  Normal pedal pulses.  Abdomen: Soft, nontender, no hepatosplenomegaly, no distention.  Skin: Intact without lesions or rashes.  Neurologic: Alert and oriented x 3.  Psych: Normal affect. Extremities: No clubbing or cyanosis.  HEENT: Normal.   Assessment/Plan: 1. Chronic HF with mid range EF:  Echo in 9/23 and again in 12/23 showed EF 40-45%, mild LVH, mild RV enlargement with low normal function, bioprosthetic aortic valve with mean gradient 16 mmHg.  This is down from EF 55-60% on 8/22 echo.  Pre-AVR cath in 5/22 showed only mild CAD and he has had no chest pain, think ischemic CMP is unlikely.  Cardiac amyloidosis has been raised as a concern.  PYP scan in 2/24 was abnormal, but I think this study is at most equivocal. It was read as grade 1, H/CL ratio > 1.5 but patient  has increased uptake in the sternum and the left lateral ribs which may have falsely elevated the H/CL ration based on sample volume location (bone survey done later in 2/24 was normal).  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  Cardiac MRI in 5/24 was not suggestive of cardiac amyloidosis, showing LV EF 33%, moderate LVh, septal hypokinesis and septal-lateral dyssynchrony, RV EF 46%, ECV 25%, no LGE.  He had a device upgrade to MDT CRT-P in 7/24.  Most recent echo 12/24 showed EF 45-50%, moderate focal basal septal hypertrophy, normal RV, bioprosthetic aortic valve mean gradient 10 mmHg, IVC normal.  He is only BiV pacing about 91% of the time, likely due to PVCs. Echo 12/25: EF lower 35-40%, RV mildly reduced, mildly reduced RV, AV prosthesis okay. NYHA class I-II symptoms and not volume overloaded by exam or Optivol.  Main complaint is orthostatic symptoms.  - With orthostasis and euvolemia on exam, decrease Lasix  to 20 mg daily and will have him wear graded compression stockings.  - Continue Coreg  3.125 qam/6.25 qpm.  I would like to increase this with PVCs,  but will not do so given orthostatic symptoms.  - Continue Entresto  24/26 bid.  - Continue Farxiga  10 mg daily.  - Continue spironolactone  25 mg daily.  BMET/BNP today.  2. Bioprosthetic aortic valve: stable on echo 12/24, mean gradient 10 mmHg. Appeared stable on echo 12/25 but not well visualized. 3. PVCs: Noted on device interrogation, impair optimal BiV pacing.  He is BiV pacing 91% of the time, but would not increase Coreg  with orthostatic symptoms. EF lower on echo 12/25, *** 4. Atrial fibrillation: Paroxysmal. No AF on device interrogation today.  - Continue Eliquis .  5. CAD: Mild on 5/22 cath. No chest pain.  - Continue Atorvastatin  40 mg. Check lipids.   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  8/23 CTA chest 4.2 cm.  Patient had AKI with CT contrast, would avoid in future.  Cannot get MRA chest with his current device.  Will need to follow by echo.    Followup   [facility], Florance Paolillo N,  07/15/2024

## 2024-07-15 NOTE — Telephone Encounter (Signed)
 Called to confirm/remind patient of their appointment at the [facility] on 07/16/24.   Appointment:   [x] Confirmed  [] Left mess   [] No answer/No voice mail  [] VM Full/unable to leave message  [] Phone not in service  Patient reminded to bring all medications and/or complete list.  Confirmed patient has transportation. Gave directions, instructed to utilize valet parking.

## 2024-07-16 ENCOUNTER — Ambulatory Visit (HOSPITAL_COMMUNITY)
Admission: RE | Admit: 2024-07-16 | Discharge: 2024-07-16 | Disposition: A | Discharge status: Home / Self Care | Source: Ambulatory Visit | Attending: Physician Assistant | Admitting: Physician Assistant

## 2024-07-16 ENCOUNTER — Encounter (HOSPITAL_COMMUNITY): Payer: Self-pay

## 2024-07-16 VITALS — BP 102/60 | HR 75 | Ht 71.0 in | Wt 226.0 lb

## 2024-07-16 DIAGNOSIS — Z7901 Long term (current) use of anticoagulants: Secondary | ICD-10-CM | POA: Insufficient documentation

## 2024-07-16 DIAGNOSIS — Z953 Presence of xenogenic heart valve: Secondary | ICD-10-CM | POA: Diagnosis not present

## 2024-07-16 DIAGNOSIS — Z79899 Other long term (current) drug therapy: Secondary | ICD-10-CM | POA: Diagnosis not present

## 2024-07-16 DIAGNOSIS — I493 Ventricular premature depolarization: Secondary | ICD-10-CM | POA: Insufficient documentation

## 2024-07-16 DIAGNOSIS — I428 Other cardiomyopathies: Secondary | ICD-10-CM | POA: Diagnosis not present

## 2024-07-16 DIAGNOSIS — I5020 Unspecified systolic (congestive) heart failure: Secondary | ICD-10-CM

## 2024-07-16 DIAGNOSIS — I7121 Aneurysm of the ascending aorta, without rupture: Secondary | ICD-10-CM | POA: Diagnosis not present

## 2024-07-16 DIAGNOSIS — I480 Paroxysmal atrial fibrillation: Secondary | ICD-10-CM | POA: Diagnosis not present

## 2024-07-16 DIAGNOSIS — I43 Cardiomyopathy in diseases classified elsewhere: Secondary | ICD-10-CM | POA: Diagnosis not present

## 2024-07-16 DIAGNOSIS — Z7984 Long term (current) use of oral hypoglycemic drugs: Secondary | ICD-10-CM | POA: Insufficient documentation

## 2024-07-16 DIAGNOSIS — I2510 Atherosclerotic heart disease of native coronary artery without angina pectoris: Secondary | ICD-10-CM | POA: Diagnosis not present

## 2024-07-16 DIAGNOSIS — E854 Organ-limited amyloidosis: Secondary | ICD-10-CM | POA: Insufficient documentation

## 2024-07-16 DIAGNOSIS — I110 Hypertensive heart disease with heart failure: Secondary | ICD-10-CM | POA: Insufficient documentation

## 2024-07-16 DIAGNOSIS — I359 Nonrheumatic aortic valve disorder, unspecified: Secondary | ICD-10-CM | POA: Diagnosis not present

## 2024-07-16 DIAGNOSIS — I5022 Chronic systolic (congestive) heart failure: Secondary | ICD-10-CM | POA: Diagnosis present

## 2024-07-16 DIAGNOSIS — Q2381 Bicuspid aortic valve: Secondary | ICD-10-CM | POA: Diagnosis not present

## 2024-07-16 LAB — BASIC METABOLIC PANEL WITH GFR
Anion gap: 12 (ref 5–15)
BUN: 44 mg/dL — ABNORMAL HIGH (ref 8–23)
CO2: 24 mmol/L (ref 22–32)
Calcium: 9.8 mg/dL (ref 8.9–10.3)
Chloride: 105 mmol/L (ref 98–111)
Creatinine, Ser: 1.7 mg/dL — ABNORMAL HIGH (ref 0.61–1.24)
GFR, Estimated: 40 mL/min — ABNORMAL LOW
Glucose, Bld: 114 mg/dL — ABNORMAL HIGH (ref 70–99)
Potassium: 5.1 mmol/L (ref 3.5–5.1)
Sodium: 141 mmol/L (ref 135–145)

## 2024-07-16 LAB — PRO BRAIN NATRIURETIC PEPTIDE: Pro Brain Natriuretic Peptide: 223 pg/mL

## 2024-07-16 MED ORDER — DAPAGLIFLOZIN PROPANEDIOL 10 MG PO TABS: 10.0000 mg | ORAL_TABLET | Freq: Every day | ORAL | 3 refills | Status: AC

## 2024-07-16 MED ORDER — CARVEDILOL 6.25 MG PO TABS: 6.2500 mg | ORAL_TABLET | Freq: Two times a day (BID) | ORAL | 3 refills | Status: AC

## 2024-07-16 MED ORDER — APIXABAN 5 MG PO TABS: 5.0000 mg | ORAL_TABLET | Freq: Two times a day (BID) | ORAL | 3 refills | Status: AC

## 2024-07-16 NOTE — Patient Instructions (Addendum)
 Thank you for coming in today  If you had labs drawn today, any labs that are abnormal the clinic will call you No news is good news  Medications: Increase Coreg  6.25 mg 1 tablet twice daily  Follow up appointments:  Your physician recommends that you schedule a follow-up appointment in:  2 months in clinic March 27th at 10am     Do the following things EVERYDAY: Weigh yourself in the morning before breakfast. Write it down and keep it in a log. Take your medicines as prescribed Eat low salt foods--Limit salt (sodium) to 2000 mg per day.  Stay as active as you can everyday Limit all fluids for the day to less than 2 liters   At the [facility], you and your health needs are our priority. As part of our continuing mission to provide you with exceptional heart care, we have created designated Provider Care Teams. These Care Teams include your primary Cardiologist (physician) and Advanced Practice Providers (APPs- Physician Assistants and Nurse Practitioners) who all work together to provide you with the care you need, when you need it.   You may see any of the following providers on your designated Care Team at your next follow up: Dr Toribio Fuel Dr Ezra Shuck Dr. Ria Gardenia Greig Lenetta, NP Caffie Shed, [place] [facility] [place], [place] Beckey Coe, NP Tinnie Redman, PharmD   Please be sure to bring in all your medications bottles to every appointment.    Thank you for choosing [facility]  If you have any questions or concerns before your next appointment please send us  a message through [facility] or call our office at [phone].    TO LEAVE A MESSAGE FOR THE NURSE SELECT OPTION 2, PLEASE LEAVE A MESSAGE INCLUDING: YOUR NAME DATE OF BIRTH CALL BACK NUMBER REASON FOR CALL**this is important as we prioritize the call backs  YOU WILL RECEIVE A CALL BACK THE SAME DAY AS LONG AS YOU CALL BEFORE  4:00 PM

## 2024-07-17 ENCOUNTER — Ambulatory Visit (HOSPITAL_COMMUNITY): Payer: Self-pay | Admitting: Physician Assistant

## 2024-07-17 DIAGNOSIS — I5022 Chronic systolic (congestive) heart failure: Secondary | ICD-10-CM

## 2024-07-17 MED ORDER — FUROSEMIDE 20 MG PO TABS: 20.0000 mg | ORAL_TABLET | ORAL | Status: AC

## 2024-07-31 ENCOUNTER — Ambulatory Visit (HOSPITAL_COMMUNITY)

## 2024-09-11 ENCOUNTER — Ambulatory Visit (HOSPITAL_COMMUNITY)

## 2024-10-22 ENCOUNTER — Ambulatory Visit: Admitting: Cardiology
